# Patient Record
Sex: Female | Born: 1980 | Race: White | Hispanic: No | Smoking: Former smoker
Health system: Southern US, Community
[De-identification: ages and names within clinical notes are randomized; demographics above are authoritative.]

## PROBLEM LIST (undated history)

## (undated) DIAGNOSIS — N83209 Unspecified ovarian cyst, unspecified side: Secondary | ICD-10-CM

## (undated) DIAGNOSIS — N809 Endometriosis, unspecified: Secondary | ICD-10-CM

## (undated) DIAGNOSIS — D649 Anemia, unspecified: Secondary | ICD-10-CM

## (undated) DIAGNOSIS — R7303 Prediabetes: Secondary | ICD-10-CM

## (undated) DIAGNOSIS — F32A Depression, unspecified: Secondary | ICD-10-CM

## (undated) DIAGNOSIS — E785 Hyperlipidemia, unspecified: Secondary | ICD-10-CM

## (undated) DIAGNOSIS — G8929 Other chronic pain: Secondary | ICD-10-CM

## (undated) DIAGNOSIS — F419 Anxiety disorder, unspecified: Secondary | ICD-10-CM

## (undated) DIAGNOSIS — N946 Dysmenorrhea, unspecified: Secondary | ICD-10-CM

## (undated) HISTORY — DX: Dysmenorrhea, unspecified: N94.6

## (undated) HISTORY — DX: Endometriosis, unspecified: N80.9

## (undated) HISTORY — DX: Prediabetes: R73.03

## (undated) HISTORY — PX: NASAL SEPTUM SURGERY: SHX37

## (undated) HISTORY — DX: Anemia, unspecified: D64.9

## (undated) HISTORY — DX: Other chronic pain: G89.29

## (undated) HISTORY — DX: Depression, unspecified: F32.A

## (undated) HISTORY — DX: Hyperlipidemia, unspecified: E78.5

## (undated) HISTORY — DX: Unspecified ovarian cyst, unspecified side: N83.209

## (undated) HISTORY — DX: Anxiety disorder, unspecified: F41.9

## (undated) HISTORY — PX: LEEP: SHX91

## (undated) HISTORY — PX: LUMBAR DISC SURGERY: SHX700

---

## 2006-05-07 ENCOUNTER — Emergency Department (HOSPITAL_COMMUNITY): Admission: EM | Admit: 2006-05-07 | Discharge: 2006-05-07 | Payer: Self-pay | Admitting: Emergency Medicine

## 2006-05-18 ENCOUNTER — Ambulatory Visit: Payer: Self-pay | Admitting: Nurse Practitioner

## 2006-05-27 ENCOUNTER — Ambulatory Visit: Payer: Self-pay | Admitting: Nurse Practitioner

## 2006-06-03 ENCOUNTER — Ambulatory Visit: Payer: Self-pay | Admitting: Nurse Practitioner

## 2006-06-03 ENCOUNTER — Encounter (INDEPENDENT_AMBULATORY_CARE_PROVIDER_SITE_OTHER): Payer: Self-pay | Admitting: *Deleted

## 2006-06-08 ENCOUNTER — Ambulatory Visit (HOSPITAL_COMMUNITY): Admission: RE | Admit: 2006-06-08 | Discharge: 2006-06-08 | Payer: Self-pay | Admitting: Family Medicine

## 2006-07-13 ENCOUNTER — Ambulatory Visit: Payer: Self-pay | Admitting: Nurse Practitioner

## 2006-07-14 ENCOUNTER — Ambulatory Visit (HOSPITAL_COMMUNITY): Admission: RE | Admit: 2006-07-14 | Discharge: 2006-07-14 | Payer: Self-pay | Admitting: Family Medicine

## 2006-07-16 ENCOUNTER — Ambulatory Visit: Payer: Self-pay | Admitting: Obstetrics & Gynecology

## 2006-07-16 ENCOUNTER — Other Ambulatory Visit: Admission: RE | Admit: 2006-07-16 | Discharge: 2006-07-16 | Payer: Self-pay | Admitting: Family Medicine

## 2006-07-19 ENCOUNTER — Encounter (INDEPENDENT_AMBULATORY_CARE_PROVIDER_SITE_OTHER): Payer: Self-pay | Admitting: *Deleted

## 2006-08-03 ENCOUNTER — Ambulatory Visit: Payer: Self-pay | Admitting: Nurse Practitioner

## 2006-08-09 ENCOUNTER — Ambulatory Visit: Payer: Self-pay | Admitting: *Deleted

## 2006-08-12 ENCOUNTER — Ambulatory Visit: Payer: Self-pay | Admitting: Obstetrics & Gynecology

## 2006-08-30 ENCOUNTER — Ambulatory Visit: Payer: Self-pay | Admitting: Nurse Practitioner

## 2006-08-31 ENCOUNTER — Ambulatory Visit (HOSPITAL_COMMUNITY): Admission: RE | Admit: 2006-08-31 | Discharge: 2006-08-31 | Payer: Self-pay | Admitting: Nurse Practitioner

## 2006-09-30 ENCOUNTER — Ambulatory Visit: Payer: Self-pay | Admitting: Nurse Practitioner

## 2006-10-06 ENCOUNTER — Ambulatory Visit: Payer: Self-pay | Admitting: Obstetrics and Gynecology

## 2006-10-25 ENCOUNTER — Ambulatory Visit: Payer: Self-pay | Admitting: Nurse Practitioner

## 2006-12-06 ENCOUNTER — Ambulatory Visit: Payer: Self-pay | Admitting: Internal Medicine

## 2007-03-09 ENCOUNTER — Encounter (INDEPENDENT_AMBULATORY_CARE_PROVIDER_SITE_OTHER): Payer: Self-pay | Admitting: *Deleted

## 2007-03-14 ENCOUNTER — Ambulatory Visit: Payer: Self-pay | Admitting: Internal Medicine

## 2007-04-07 ENCOUNTER — Ambulatory Visit: Payer: Self-pay | Admitting: Family Medicine

## 2007-05-17 ENCOUNTER — Ambulatory Visit: Payer: Self-pay | Admitting: Internal Medicine

## 2007-08-25 ENCOUNTER — Emergency Department (HOSPITAL_COMMUNITY): Admission: EM | Admit: 2007-08-25 | Discharge: 2007-08-25 | Payer: Self-pay | Admitting: Family Medicine

## 2007-09-14 ENCOUNTER — Ambulatory Visit: Payer: Self-pay | Admitting: Internal Medicine

## 2007-10-24 ENCOUNTER — Ambulatory Visit: Payer: Self-pay | Admitting: Family Medicine

## 2008-02-21 ENCOUNTER — Other Ambulatory Visit: Admission: RE | Admit: 2008-02-21 | Discharge: 2008-02-21 | Payer: Self-pay | Admitting: Obstetrics and Gynecology

## 2008-05-31 ENCOUNTER — Other Ambulatory Visit: Admission: RE | Admit: 2008-05-31 | Discharge: 2008-05-31 | Payer: Self-pay | Admitting: Obstetrics and Gynecology

## 2008-08-28 ENCOUNTER — Encounter (INDEPENDENT_AMBULATORY_CARE_PROVIDER_SITE_OTHER): Payer: Self-pay | Admitting: Obstetrics and Gynecology

## 2008-08-28 ENCOUNTER — Inpatient Hospital Stay (HOSPITAL_COMMUNITY): Admission: RE | Admit: 2008-08-28 | Discharge: 2008-08-30 | Payer: Self-pay | Admitting: Pediatrics

## 2008-09-14 ENCOUNTER — Ambulatory Visit (HOSPITAL_COMMUNITY): Admission: RE | Admit: 2008-09-14 | Discharge: 2008-09-14 | Payer: Self-pay | Admitting: Obstetrics and Gynecology

## 2009-02-08 ENCOUNTER — Encounter: Admission: RE | Admit: 2009-02-08 | Discharge: 2009-02-08 | Payer: Self-pay | Admitting: Family Medicine

## 2009-04-05 ENCOUNTER — Ambulatory Visit (HOSPITAL_COMMUNITY): Admission: RE | Admit: 2009-04-05 | Discharge: 2009-04-05 | Payer: Self-pay | Admitting: Family Medicine

## 2009-04-12 ENCOUNTER — Encounter: Admission: RE | Admit: 2009-04-12 | Discharge: 2009-04-12 | Payer: Self-pay | Admitting: Neurosurgery

## 2009-07-16 ENCOUNTER — Encounter: Admission: RE | Admit: 2009-07-16 | Discharge: 2009-07-16 | Payer: Self-pay | Admitting: Neurological Surgery

## 2009-09-11 ENCOUNTER — Inpatient Hospital Stay (HOSPITAL_COMMUNITY): Admission: RE | Admit: 2009-09-11 | Discharge: 2009-09-16 | Payer: Self-pay | Admitting: Neurological Surgery

## 2009-09-26 ENCOUNTER — Encounter: Admission: RE | Admit: 2009-09-26 | Discharge: 2009-09-26 | Payer: Self-pay | Admitting: Neurological Surgery

## 2009-10-01 ENCOUNTER — Encounter: Admission: RE | Admit: 2009-10-01 | Discharge: 2009-10-01 | Payer: Self-pay | Admitting: Neurological Surgery

## 2009-10-14 ENCOUNTER — Encounter: Admission: RE | Admit: 2009-10-14 | Discharge: 2009-10-14 | Payer: Self-pay | Admitting: Neurological Surgery

## 2009-12-16 ENCOUNTER — Encounter: Admission: RE | Admit: 2009-12-16 | Discharge: 2009-12-16 | Payer: Self-pay | Admitting: Neurological Surgery

## 2010-07-08 ENCOUNTER — Encounter
Admission: RE | Admit: 2010-07-08 | Discharge: 2010-07-08 | Payer: Self-pay | Source: Home / Self Care | Attending: Neurological Surgery | Admitting: Neurological Surgery

## 2010-07-13 ENCOUNTER — Encounter: Payer: Self-pay | Admitting: Neurological Surgery

## 2010-07-13 ENCOUNTER — Encounter: Payer: Self-pay | Admitting: Family Medicine

## 2010-07-17 ENCOUNTER — Other Ambulatory Visit: Payer: Self-pay | Admitting: Neurological Surgery

## 2010-07-17 DIAGNOSIS — Z981 Arthrodesis status: Secondary | ICD-10-CM

## 2010-09-14 LAB — TYPE AND SCREEN
ABO/RH(D): O POS
Antibody Screen: NEGATIVE

## 2010-09-14 LAB — CBC
HCT: 37.4 % (ref 36.0–46.0)
MCHC: 34.2 g/dL (ref 30.0–36.0)
WBC: 8.4 10*3/uL (ref 4.0–10.5)

## 2010-09-14 LAB — DIFFERENTIAL
Basophils Absolute: 0.2 10*3/uL — ABNORMAL HIGH (ref 0.0–0.1)
Basophils Relative: 2 % — ABNORMAL HIGH (ref 0–1)
Eosinophils Absolute: 0.2 10*3/uL (ref 0.0–0.7)
Lymphs Abs: 2.1 10*3/uL (ref 0.7–4.0)
Neutro Abs: 5.4 10*3/uL (ref 1.7–7.7)
Neutrophils Relative %: 64 % (ref 43–77)

## 2010-09-14 LAB — BASIC METABOLIC PANEL
BUN: 7 mg/dL (ref 6–23)
Calcium: 9.3 mg/dL (ref 8.4–10.5)
Chloride: 107 mEq/L (ref 96–112)
Creatinine, Ser: 0.51 mg/dL (ref 0.4–1.2)
GFR calc non Af Amer: >60 mL/min (ref >60)
Glucose, Bld: 101 mg/dL — ABNORMAL HIGH (ref 70–99)
Potassium: 4.3 mEq/L (ref 3.5–5.1)

## 2010-09-14 LAB — APTT: aPTT: 40 seconds — ABNORMAL HIGH (ref 24–37)

## 2010-09-14 LAB — SURGICAL PCR SCREEN: Staphylococcus aureus: NEGATIVE

## 2010-10-02 LAB — CBC
HCT: 28.1 % — ABNORMAL LOW (ref 36.0–46.0)
Hemoglobin: 9.4 g/dL — ABNORMAL LOW (ref 12.0–15.0)
MCHC: 33.4 g/dL (ref 30.0–36.0)
MCHC: 33.4 g/dL (ref 30.0–36.0)
MCV: 90.8 fL (ref 78.0–100.0)
MCV: 91.3 fL (ref 78.0–100.0)
Platelets: 220 10*3/uL (ref 150–400)
RBC: 3.1 MIL/uL — ABNORMAL LOW (ref 3.87–5.11)
WBC: 12.2 10*3/uL — ABNORMAL HIGH (ref 4.0–10.5)
WBC: 12.3 10*3/uL — ABNORMAL HIGH (ref 4.0–10.5)

## 2010-10-02 LAB — RPR: RPR Ser Ql: NONREACTIVE

## 2010-10-02 LAB — RAPID URINE DRUG SCREEN, HOSP PERFORMED: Barbiturates: NOT DETECTED

## 2010-11-04 ENCOUNTER — Other Ambulatory Visit: Payer: Self-pay | Admitting: Neurological Surgery

## 2010-11-04 ENCOUNTER — Ambulatory Visit
Admission: RE | Admit: 2010-11-04 | Discharge: 2010-11-04 | Disposition: A | Discharge status: Home / Self Care | Payer: Self-pay | Source: Ambulatory Visit | Attending: Neurological Surgery | Admitting: Neurological Surgery

## 2010-11-04 DIAGNOSIS — M545 Low back pain: Secondary | ICD-10-CM

## 2010-11-07 NOTE — Group Therapy Note (Signed)
NAMEILLANA, Rachel Bennett NO.:  [id number]   MEDICAL RECORD NO.:  [id number]          PATIENT TYPE:  WOC   LOCATION:  [facility]                   FACILITY:  [facility]   PHYSICIAN:  Argentina Donovan, MD        DATE OF BIRTH:  [birth year]   DATE OF SERVICE:  10/06/2006                                  CLINIC NOTE   The patient is a 30 year old white female gravida 1, para 1-0-0-1 who  has had some abnormal Pap smears since the age of 74, but recently had  one more severe. 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 She came in today with her husband.  She is a smoker of a pack  and half a cigarettes per day; her husband is a nonsmoker and we  discussed the procedure in detail. In discussing, we discussed the  alternatives and also the possible complications, especially those  related to childbearing. At the end of our conversation the patient has  decided with all the options we gave her and that she is going to give  up smoking and would like to try the cryosurgery. 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           ______________________________  Argentina Donovan, MD     PR/MEDQ  D:  10/06/2006  T:  10/06/2006  Job:  [id number]

## 2011-02-03 ENCOUNTER — Other Ambulatory Visit: Payer: Self-pay | Admitting: Neurological Surgery

## 2011-02-03 DIAGNOSIS — M545 Low back pain: Secondary | ICD-10-CM

## 2011-03-13 ENCOUNTER — Ambulatory Visit
Admission: RE | Admit: 2011-03-13 | Discharge: 2011-03-13 | Disposition: A | Discharge status: Home / Self Care | Payer: 59 | Source: Ambulatory Visit | Attending: Neurological Surgery | Admitting: Neurological Surgery

## 2011-03-13 DIAGNOSIS — M545 Low back pain, unspecified: Secondary | ICD-10-CM

## 2011-04-10 IMAGING — RF DG LUMBAR SPINE 2-3V
1 series · 2 of 2 positions shown · non-contrast
Comparison: CT 07/16/2009

CLINICAL DATA: PLIF L5-S1.

LUMBAR SPINE - 2-3 VIEW

[Series 1: run · 2 of 2 slices shown]
[im 1/2]
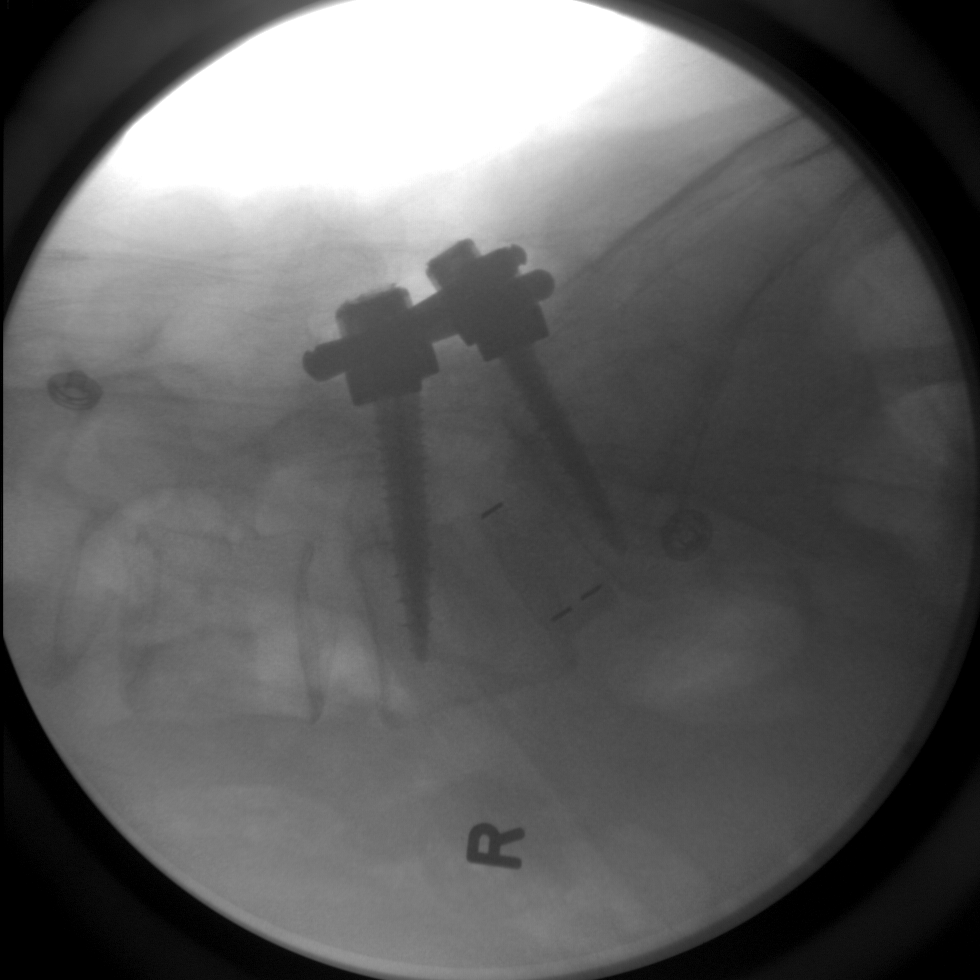
[im 2/2]
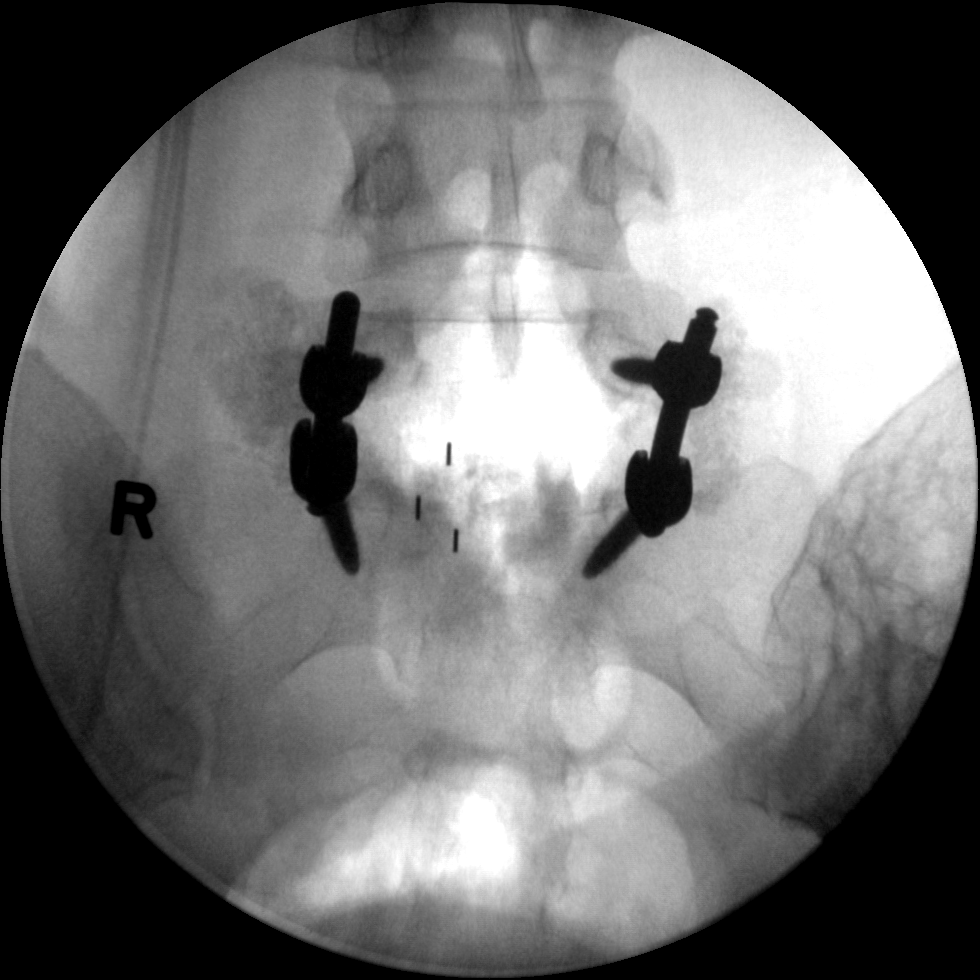

[2 of 2 positions shown; findings below may reference images not displayed]

FINDINGS: Status post PLIF procedure at L5-S1.  Transpedicle
screws are noted at L5 and S1.  Interbody fusion material with
spacers is noted as well.
IMPRESSION: Status post PLIF procedure at L5-S1.

## 2011-06-07 ENCOUNTER — Emergency Department (HOSPITAL_COMMUNITY)
Admission: EM | Admit: 2011-06-07 | Discharge: 2011-06-07 | Disposition: A | Discharge status: Home / Self Care | Payer: Self-pay | Source: Home / Self Care | Attending: Family Medicine | Admitting: Family Medicine

## 2011-06-07 ENCOUNTER — Emergency Department (INDEPENDENT_AMBULATORY_CARE_PROVIDER_SITE_OTHER): Payer: Self-pay

## 2011-06-07 DIAGNOSIS — S92253A Displaced fracture of navicular [scaphoid] of unspecified foot, initial encounter for closed fracture: Secondary | ICD-10-CM

## 2011-06-07 NOTE — ED Provider Notes (Signed)
History     CSN: [id number] Arrival date & time: 06/07/2011  4:14 PM   First MD Initiated Contact with Patient 06/07/11 1457      Chief Complaint  Patient presents with  . Foot Injury    (Consider location/radiation/quality/duration/timing/severity/associated sxs/prior treatment) Patient is a 30 y.o. female presenting with foot injury.  Foot Injury  The incident occurred more than 2 days ago. The incident occurred at home. The injury mechanism was torsion. The pain is present in the left foot. The quality of the pain is described as aching and throbbing. The pain is moderate. Associated symptoms include inability to bear weight. Associated symptoms comments: Using crutches.. She reports no foreign bodies present. The symptoms are aggravated by nothing.    History reviewed. No pertinent past medical history.  Past Surgical History  Procedure Date  . Lumbar disc surgery     History reviewed. No pertinent family history.  History  Substance Use Topics  . Smoking status: Current Everyday Smoker  . Smokeless tobacco: Not on file  . Alcohol Use: No    OB History    Grav Para Term Preterm Abortions TAB SAB Ect Mult Living                  Review of Systems  Constitutional: Negative.   Musculoskeletal: Positive for joint swelling and gait problem.    Allergies  Sulfa antibiotics  Home Medications   Current Outpatient Rx  Name Route Sig Dispense Refill  . MORPHINE SULFATE 30 MG PO TABS Oral Take 30 mg by mouth 2 (two) times daily at 10 AM and 5 PM.      . OXYCODONE-ACETAMINOPHEN 10-325 MG PO TABS Oral Take 1 tablet by mouth QID.        BP 115/79  Pulse 88  Temp(Src) 98.2 F (36.8 C) (Oral)  Resp 17  SpO2 96%  LMP 05/29/2011  Physical Exam  Constitutional: She appears well-developed and well-nourished.  Musculoskeletal: She exhibits edema and tenderness.       Feet:  Skin: Skin is warm and dry.    ED Course  Procedures (including critical care  time)  Labs Reviewed - No data to display Dg Foot Complete Left  06/07/2011  **ADDENDUM** CREATED: 06/07/2011 16:56:40  The referring physician reports the patient is having point tenderness within the lateral hindfoot region.  On the radiograph there is a linear ossific density adjacent to the calcaneous. Although this is not seen on the oblique radiograph, this may represent a small avulsion fracture.  **END ADDENDUM** SIGNED BY: Rosealee Albee, M.D.   06/07/2011  *RADIOLOGY REPORT*  Clinical Data: Status post fall.  LEFT FOOT - COMPLETE 3+ VIEW  Comparison: None  Findings:   There is no evidence of fracture or dislocation.  There is no evidence of arthropathy or other focal bone abnormality. Soft tissues are unremarkable.  IMPRESSION:  Negative exam. Original Report Authenticated By: Rosealee Albee, M.D.     1. Avulsion fracture of navicular bone of foot       MDM  X-rays reviewed and report per radiologist.         Barkley Bruns, MD 06/07/11 2015

## 2011-06-07 NOTE — ED Notes (Signed)
Pt stood up on Wed and states she turned her lt foot over and "felt a crunch".  Swollen and bruised, pt walking on crutches.

## 2013-01-03 IMAGING — CR DG FOOT COMPLETE 3+V*L*
3 series · 3 of 3 positions shown · non-contrast
Comparison: None

***ADDENDUM*** CREATED: 06/07/2011 [DATE]

The referring physician reports the patient is having point
tenderness within the lateral hindfoot region.  On the radiograph
there is a linear ossific density adjacent to the calcaneous.
Although this is not seen on the oblique radiograph, this may
represent a small avulsion fracture.
CLINICAL DATA: Status post fall.
LEFT FOOT - COMPLETE 3+ VIEW

[view not recorded (1 of 3)]
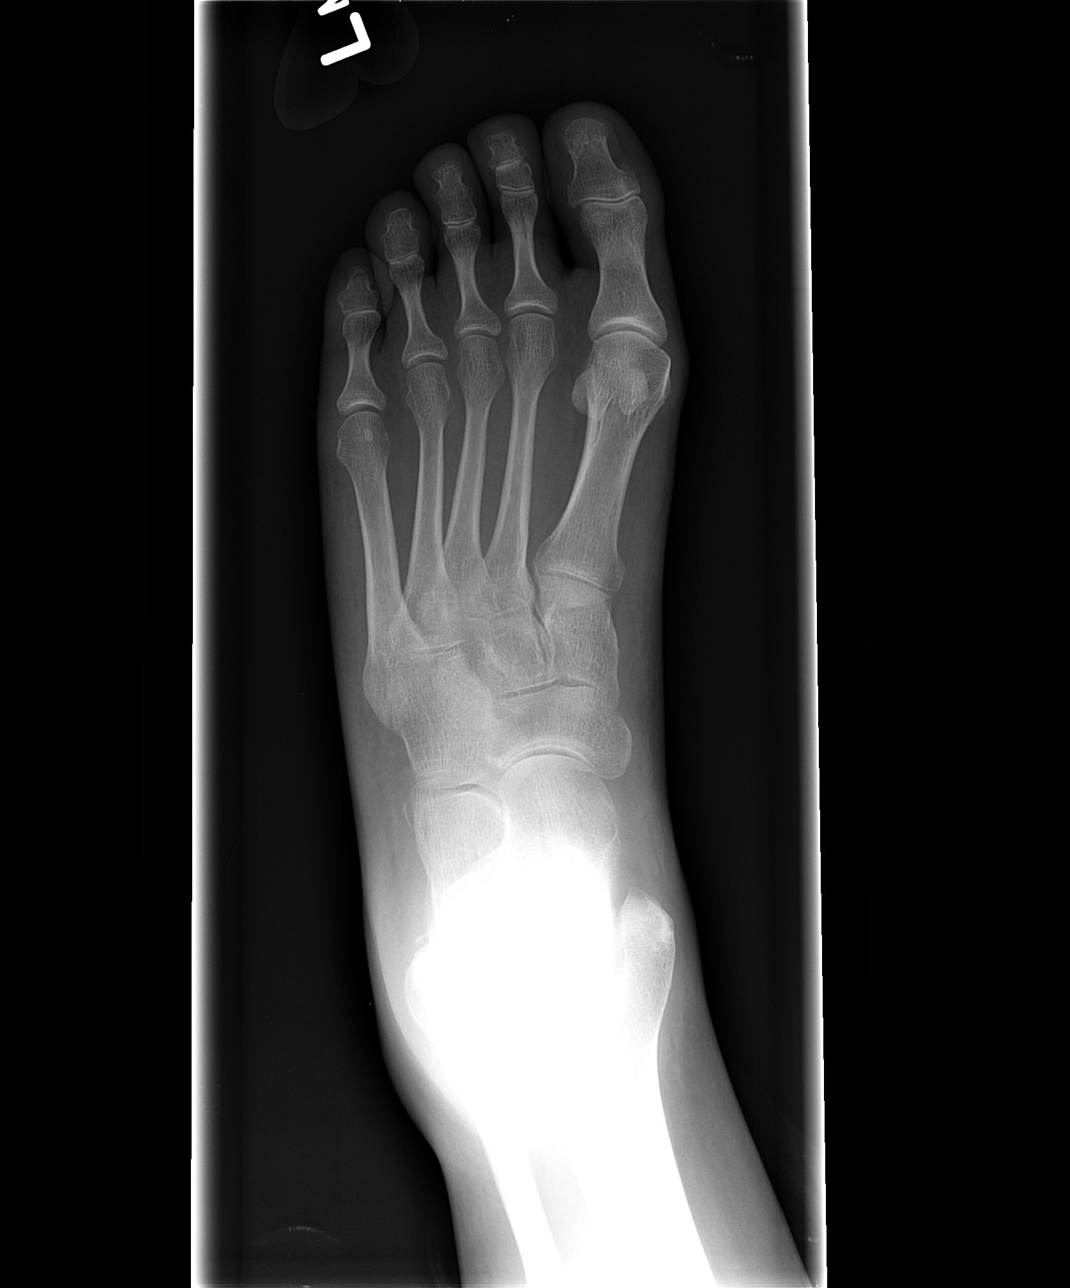

[view not recorded (2 of 3)]
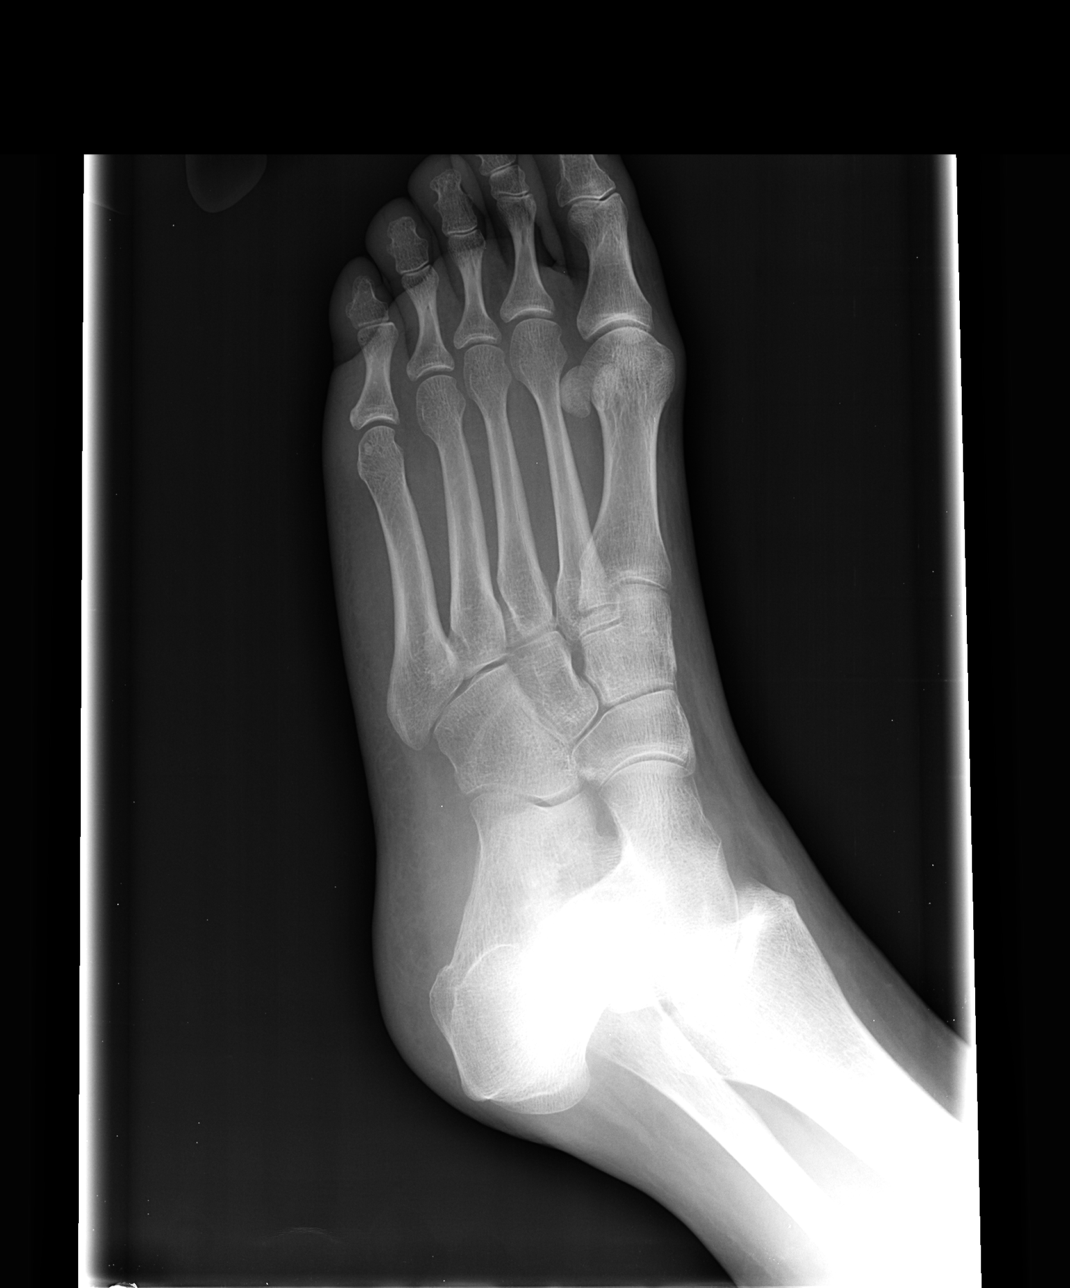

[view not recorded (3 of 3)]
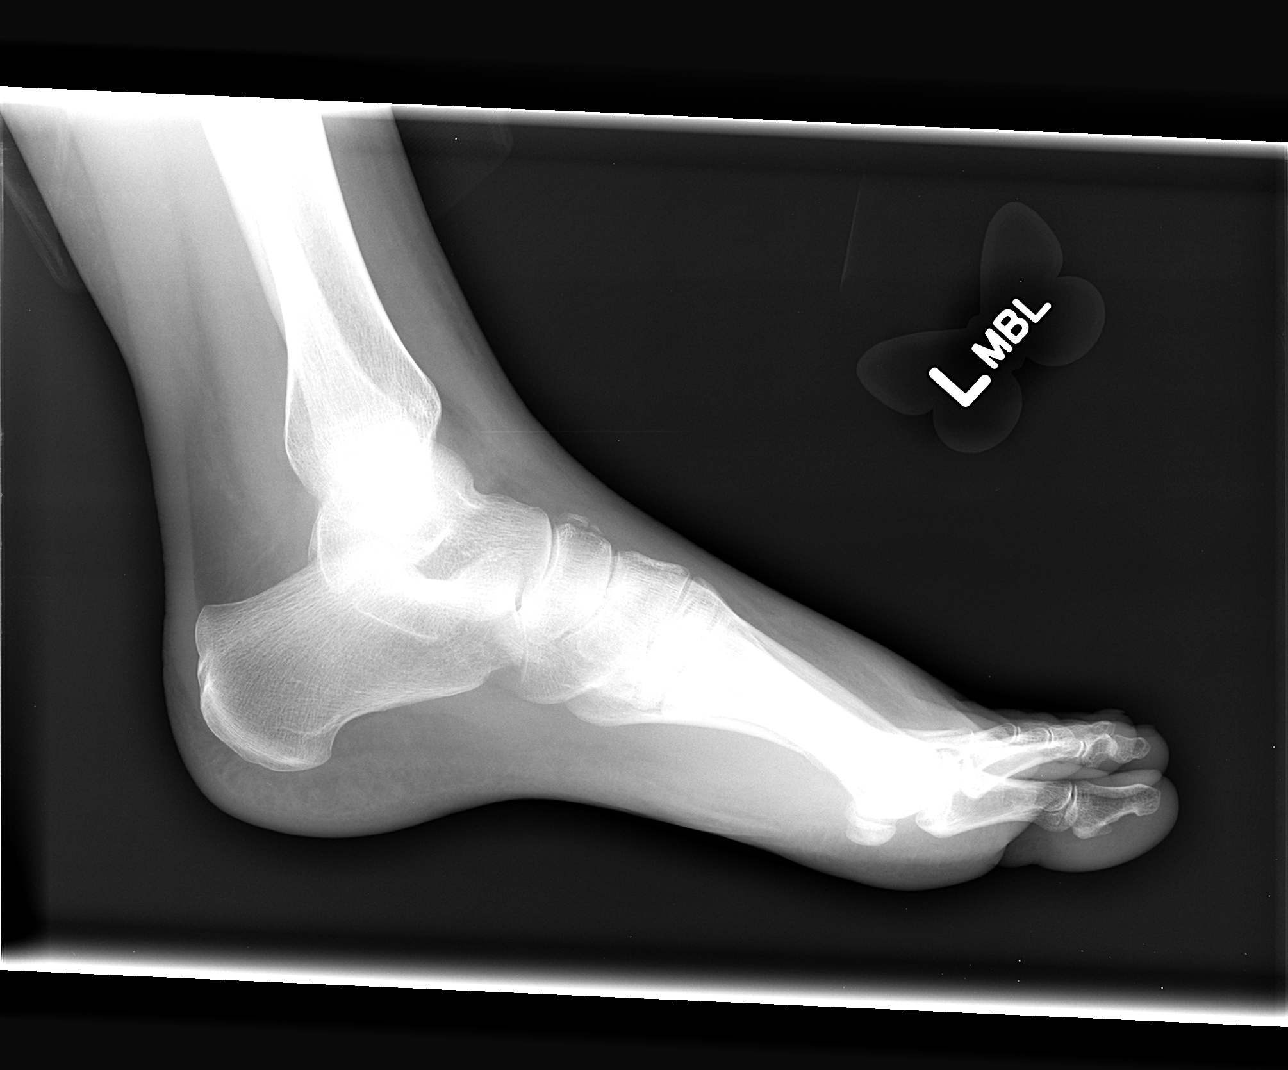

[3 of 3 positions shown; findings below may reference images not displayed]

FINDINGS: There is no evidence of fracture or dislocation.  There
is no evidence of arthropathy or other focal bone abnormality.
Soft tissues are unremarkable.
IMPRESSION: Negative exam.

## 2017-03-04 ENCOUNTER — Ambulatory Visit (INDEPENDENT_AMBULATORY_CARE_PROVIDER_SITE_OTHER): Payer: 59 | Admitting: Adult Health

## 2017-03-04 ENCOUNTER — Encounter: Payer: Self-pay | Admitting: Adult Health

## 2017-03-04 VITALS — BP 107/73 | HR 102 | Ht 65.25 in | Wt 109.8 lb

## 2017-03-04 DIAGNOSIS — G8929 Other chronic pain: Secondary | ICD-10-CM

## 2017-03-04 DIAGNOSIS — F1193 Opioid use, unspecified with withdrawal: Secondary | ICD-10-CM | POA: Insufficient documentation

## 2017-03-04 DIAGNOSIS — R1310 Dysphagia, unspecified: Secondary | ICD-10-CM | POA: Diagnosis not present

## 2017-03-04 DIAGNOSIS — R000 Tachycardia, unspecified: Secondary | ICD-10-CM

## 2017-03-04 DIAGNOSIS — M545 Low back pain: Secondary | ICD-10-CM | POA: Diagnosis not present

## 2017-03-04 DIAGNOSIS — F1123 Opioid dependence with withdrawal: Secondary | ICD-10-CM | POA: Diagnosis not present

## 2017-03-04 HISTORY — DX: Opioid dependence with withdrawal: F11.23

## 2017-03-04 HISTORY — DX: Opioid use, unspecified with withdrawal: F11.93

## 2017-03-04 MED ORDER — LORAZEPAM 0.5 MG PO TABS: 0.5000 mg | ORAL_TABLET | Freq: Three times a day (TID) | ORAL | 0 refills | Status: DC | PRN

## 2017-03-04 MED ORDER — ONDANSETRON 8 MG PO TBDP: 8.0000 mg | ORAL_TABLET | Freq: Three times a day (TID) | ORAL | 0 refills | Status: DC | PRN

## 2017-03-04 NOTE — Assessment & Plan Note (Addendum)
Several calls placed to [facility]-no response.  Controlled Substance Website reviewed- 30 day rx for oxycodone and morphine being filled every 30 days. EKG- ST with possible L atrial enlargement. She vehemently refused to go to UC/ED or seek help at treatment center and does not plan on remaining off narcotics. Unable to corroborate story with pain clinic, therefore will not provide narcotic rx. Pt given instructions on withdrawal and Lorazepam and Ondansetron rx's to help ease withdrawal sx's. Encouraged to push fluids and eat small, well balanced meals. Discussed Red Flag sx's and to seek immediate medical attention if any develop. Discussed at length the benefits of remaining off narcotics and trying alternative pain control therapy-PT, chiropractor, injection therapy, smoking cessation. Since she is so unhappy with current pain clinic, referral for new pain clinic placed. Recommended to schedule CPE with fasting labs in 4 weeks.

## 2017-03-04 NOTE — Progress Notes (Signed)
Subjective:    Patient ID: Rachel Bennett, female    DOB: 04/04/1981, 36 y.o.   MRN: [id number]  HPI:  Rachel Bennett presents to establish as a new pt.  She is a 36 year old female.  PMH: Chronic pain with long term narcotic therapy.  Chronic pain in lumbar back, bil hips, and pelvis.  She denies acute injury/trauma prior to pain developing and states "I was born with this pain".  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 She underwent PLIF L5-S1 3/23/1[place]/nuerosurgeon. She was eventually referred to [facility] and has been under their care for since 2012.  She was previously on Oxy/Acetaminphen 10/325 1 tab Q4H and Morphine ER 15mg  Q12H.  Then she was taken of both and started on Oxy 15mg  Q4-6H on 01/29/17.  She reports that the switch was "b/c the government says you can't be on 2 narcotics at the same time". She reports that the new med was causing burning tongue sensation and dysphagia when swallowing the tablet.  She denies this ever occurring before and denies hx of GERD.  She reports that when she went for her regular monthly f/u at the [facility] clinic the provider refused to refill her meds due to new of dysphagia and required her to be evaluated by primary care. She has not had regular medical or dental care in >10 years. She has not had any narcotics since 02/26/17 and has been experiencing N/V/D/fever/sweats/chills/body aches/extremet fatigue/poor appetite/insomnia/chest tightness and severe pain in back/hips/pelvis. She smokes 1-1/2 pack and day and has been for >18 years. She denies any ETOH use. She reports 10lb weight loss since 02/26/17.  She has f/u with the [place] clinic 03/09/17. She is married with two children ages 32,13.  Her husband is currently out of town, however her brother in law is local and has been assisting her as needed. She denies thoughts of harming herself/others. She reports feeling "absoluetly the worst I have ever felt in my entire  life".   Patient Care Team    Relationship Specialty Notifications Start End  Esaw Grandchild, NP PCP - General Family Medicine  03/04/17     Patient Active Problem List   Diagnosis Date Noted  . Opiate withdrawal ([place]) 03/04/2017     Past Medical History:  Diagnosis Date  . Chronic pain    back, hips and pelvis     Past Surgical History:  Procedure Laterality Date  . LUMBAR DISC SURGERY    . NASAL SEPTUM SURGERY       Family History  Problem Relation Age of Onset  . Arthritis Mother   . Anxiety disorder Mother   . Depression Mother   . Diabetes Mother   . Heart disease Mother   . Hyperlipidemia Mother   . Hypertension Mother   . Thyroid disease Mother   . Birth defects Sister   . Migraines Sister   . Healthy Brother   . Healthy Daughter   . Healthy Sister   . Drug abuse Brother   . Healthy Brother   . Healthy Daughter      History  Drug Use No     History  Alcohol Use No     History  Smoking Status  . Current Every Day Smoker  . Packs/day: 1.00  . Years: 18.00  . Types: Cigarettes  Smokeless Tobacco  . Never Used     Outpatient Encounter Prescriptions as of 03/04/2017  Medication Sig  .  cyclobenzaprine (FLEXERIL) 10 MG tablet Take 1 tablet by mouth 2 (two) times daily.  Marland Kitchen LORazepam (ATIVAN) 0.5 MG tablet Take 1 tablet (0.5 mg total) by mouth every 8 (eight) hours as needed for anxiety or sleep.  Marland Kitchen ondansetron (ZOFRAN-ODT) 8 MG disintegrating tablet Take 1 tablet (8 mg total) by mouth every 8 (eight) hours as needed for nausea or vomiting.  Marland Kitchen oxyCODONE (ROXICODONE) 15 MG immediate release tablet Take 1 tablet by mouth every 5 (five) hours as needed.  Marland Kitchen oxyCODONE-acetaminophen (PERCOCET) 10-325 MG per tablet Take 1 tablet by mouth QID.    . [DISCONTINUED] morphine (MSIR) 30 MG tablet Take 30 mg by mouth 2 (two) times daily at 10 AM and 5 PM.     No facility-administered encounter medications on file as of 03/04/2017.     Allergies: Sulfa  antibiotics  Body mass index is 18.13 kg/m.  Blood pressure 107/73, pulse (!) 102, height 5' 5.25" (1.657 m), weight 109 lb 12.8 oz (49.8 kg), last menstrual period 02/12/2017.    Patient Care Team    Relationship Specialty Notifications Start End  Esaw Grandchild, NP PCP - General Family Medicine  03/04/17     Patient Active Problem List   Diagnosis Date Noted  . Opiate withdrawal ([place]) 03/04/2017     Past Medical History:  Diagnosis Date  . Chronic pain    back, hips and pelvis     Past Surgical History:  Procedure Laterality Date  . LUMBAR DISC SURGERY    . NASAL SEPTUM SURGERY       Family History  Problem Relation Age of Onset  . Arthritis Mother   . Anxiety disorder Mother   . Depression Mother   . Diabetes Mother   . Heart disease Mother   . Hyperlipidemia Mother   . Hypertension Mother   . Thyroid disease Mother   . Birth defects Sister   . Migraines Sister   . Healthy Brother   . Healthy Daughter   . Healthy Sister   . Drug abuse Brother   . Healthy Brother   . Healthy Daughter      History  Drug Use No     History  Alcohol Use No     History  Smoking Status  . Current Every Day Smoker  . Packs/day: 1.00  . Years: 18.00  . Types: Cigarettes  Smokeless Tobacco  . Never Used     Outpatient Encounter Prescriptions as of 03/04/2017  Medication Sig  . cyclobenzaprine (FLEXERIL) 10 MG tablet Take 1 tablet by mouth 2 (two) times daily.  Marland Kitchen LORazepam (ATIVAN) 0.5 MG tablet Take 1 tablet (0.5 mg total) by mouth every 8 (eight) hours as needed for anxiety or sleep.  Marland Kitchen ondansetron (ZOFRAN-ODT) 8 MG disintegrating tablet Take 1 tablet (8 mg total) by mouth every 8 (eight) hours as needed for nausea or vomiting.  Marland Kitchen oxyCODONE (ROXICODONE) 15 MG immediate release tablet Take 1 tablet by mouth every 5 (five) hours as needed.  Marland Kitchen oxyCODONE-acetaminophen (PERCOCET) 10-325 MG per tablet Take 1 tablet by mouth QID.    . [DISCONTINUED] morphine  (MSIR) 30 MG tablet Take 30 mg by mouth 2 (two) times daily at 10 AM and 5 PM.     No facility-administered encounter medications on file as of 03/04/2017.     Allergies: Sulfa antibiotics  Body mass index is 18.13 kg/m.  Blood pressure 107/73, pulse (!) 102, height 5' 5.25" (1.657 m), weight 109 lb 12.8 oz (49.8 kg),  last menstrual period 02/12/2017.    Review of Systems  Constitutional: Positive for activity change, appetite change, chills, diaphoresis, fatigue, fever and unexpected weight change.  HENT: Negative for congestion.   Eyes: Negative for visual disturbance.  Respiratory: Positive for chest tightness. Negative for cough, shortness of breath, wheezing and stridor.   Cardiovascular: Positive for chest pain. Negative for palpitations and leg swelling.  Gastrointestinal: Positive for abdominal distention, abdominal pain, diarrhea, nausea and vomiting. Negative for blood in stool and constipation.  Endocrine: Positive for cold intolerance and heat intolerance. Negative for polydipsia, polyphagia and polyuria.  Genitourinary: Negative for difficulty urinating, flank pain and hematuria.  Musculoskeletal: Positive for arthralgias, back pain, gait problem and myalgias. Negative for joint swelling, neck pain and neck stiffness.  Skin: Negative for color change, pallor, rash and wound.  Neurological: Positive for dizziness and headaches.  Hematological: Does not bruise/bleed easily.  Psychiatric/Behavioral: Positive for agitation and sleep disturbance. Negative for behavioral problems, confusion, decreased concentration, dysphoric mood, hallucinations, self-injury and suicidal ideas. The patient is nervous/anxious. The patient is not hyperactive.        Objective:   Physical Exam  Constitutional: She is oriented to person, place, and time. She appears well-developed. She appears distressed.  HENT:  Head: Normocephalic and atraumatic.  Right Ear: No decreased hearing is noted.   Left Ear: No decreased hearing is noted.  Nose: Nose normal. Right sinus exhibits no maxillary sinus tenderness and no frontal sinus tenderness. Left sinus exhibits no maxillary sinus tenderness and no frontal sinus tenderness.  Mouth/Throat: Uvula is midline. Mucous membranes are pale and dry. Oral lesions present. Abnormal dentition. Dental caries present. No uvula swelling. Posterior oropharyngeal erythema present. No oropharyngeal exudate, posterior oropharyngeal edema or tonsillar abscesses.  Eyes: Pupils are equal, round, and reactive to light. Conjunctivae are normal.  Neck: Normal range of motion. Neck supple.  Cardiovascular: Regular rhythm, S1 normal, S2 normal, normal heart sounds and intact distal pulses.  Tachycardia present.  Exam reveals no gallop.   No murmur heard. HR 102-142 EKG completed, results discussed at [facility]  Pulmonary/Chest: Effort normal and breath sounds normal. No respiratory distress. She has no wheezes. She has no rales. She exhibits no tenderness.  Abdominal: Soft. Bowel sounds are normal. She exhibits no distension and no mass. There is generalized tenderness. There is no rigidity, no rebound, no guarding, no CVA tenderness, no tenderness at McBurney's point and negative Murphy's sign.  Musculoskeletal: She exhibits tenderness.  Lymphadenopathy:    She has no cervical adenopathy.  Neurological: She is alert and oriented to person, place, and time.  Skin: Skin is warm and dry. No rash noted. She is not diaphoretic. No erythema. No pallor.  Psychiatric: Her speech is normal and behavior is normal. Judgment and thought content normal. Her mood appears anxious. Cognition and memory are normal.  Nursing note and vitals reviewed.         Assessment & Plan:   1. Chronic bilateral low back pain, with sciatica presence unspecified   2. Opiate withdrawal ([facility])   3. Dysphagia, idiopathic     Opiate withdrawal ([facility]) Several calls placed to [facility]-no  response. [place] Controlled Substance Website reviewed- 30 day rx for oxycodone and morphine being filled every 30 days. EKG- ST with possible L atrial enlargement. She vehemently refused to go to UC/ED or seek help at treatment center and does not plan on remaining off narcotics. Unable to corroborate story with pain clinic, therefore will not provide narcotic rx.  Pt given instructions on withdrawal and Lorazepam and Ondansetron rx's to help ease withdrawal sx's. Encouraged to push fluids and eat small, well balanced meals. Discussed Red Flag sx's and to seek immediate medical attention if any develop. Discussed at length the benefits of remaining off narcotics and trying alternative pain control therapy-PT, chiropractor, injection therapy, smoking cessation. Since she is so unhappy with current pain clinic, referral for new pain clinic placed. Recommended to schedule CPE with fasting labs in 4 weeks.   Chronic bilateral low back pain Discussed non-narcotic pain relief measures-PT, chiropractor, injection therapy, smoking cessation.    Dysphagia, idiopathic Reports dysphagia when trying to swallow the newly rx'd oxycodone 15mg  IR tablets. She was able to swallow >16 ounces of water without any difficulty in clinic today. No obvious structural abnormalities of throat noted.   If this occurs again, then referral to ENT.  Well over an hour spent with the pt discussing long term narcotic use and withdrawal.  FOLLOW-UP:  Return in 4 weeks (on 04/01/2017) for CPE, Fasting Lab Draw.

## 2017-03-04 NOTE — Patient Instructions (Addendum)
Heart-Healthy Eating Plan Many factors influence your heart health, including eating and exercise habits. Heart (coronary) risk increases with abnormal blood fat (lipid) levels. Heart-healthy meal planning includes limiting unhealthy fats, increasing healthy fats, and making other small dietary changes. This includes maintaining a healthy body weight to help keep lipid levels within a normal range. What is my plan? Your health care provider recommends that you:  Get no more than __25__% of the total calories in your daily diet from fat.  Limit your intake of saturated fat to less than ___5___% of your total calories each day.  Limit the amount of cholesterol in your diet to less than _300__ mg per day.  What types of fat should I choose?  Choose healthy fats more often. Choose monounsaturated and polyunsaturated fats, such as olive oil and canola oil, flaxseeds, walnuts, almonds, and seeds.  Eat more omega-3 fats. Good choices include salmon, mackerel, sardines, tuna, flaxseed oil, and ground flaxseeds. Aim to eat fish at least two times each week.  Limit saturated fats. Saturated fats are primarily found in animal products, such as meats, butter, and cream. Plant sources of saturated fats include palm oil, palm kernel oil, and coconut oil.  Avoid foods with partially hydrogenated oils in them. These contain trans fats. Examples of foods that contain trans fats are stick margarine, some tub margarines, cookies, crackers, and other baked goods. What general guidelines do I need to follow?  Check food labels carefully to identify foods with trans fats or high amounts of saturated fat.  Fill one half of your plate with vegetables and green salads. Eat 4-5 servings of vegetables per day. A serving of vegetables equals 1 cup of raw leafy vegetables,  cup of raw or cooked cut-up vegetables, or  cup of vegetable juice.  Fill one fourth of your plate with whole grains. Look for the word "whole"  as the first word in the ingredient list.  Fill one fourth of your plate with lean protein foods.  Eat 4-5 servings of fruit per day. A serving of fruit equals one medium whole fruit,  cup of dried fruit,  cup of fresh, frozen, or canned fruit, or  cup of 100% fruit juice.  Eat more foods that contain soluble fiber. Examples of foods that contain this type of fiber are apples, broccoli, carrots, beans, peas, and barley. Aim to get 20-30 g of fiber per day.  Eat more home-cooked food and less restaurant, buffet, and fast food.  Limit or avoid alcohol.  Limit foods that are high in starch and sugar.  Avoid fried foods.  Cook foods by using methods other than frying. Baking, boiling, grilling, and broiling are all great options. Other fat-reducing suggestions include: ? Removing the skin from poultry. ? Removing all visible fats from meats. ? Skimming the fat off of stews, soups, and gravies before serving them. ? Steaming vegetables in water or broth.  Lose weight if you are overweight. Losing just 5-10% of your initial body weight can help your overall health and prevent diseases such as diabetes and heart disease.  Increase your consumption of nuts, legumes, and seeds to 4-5 servings per week. One serving of dried beans or legumes equals  cup after being cooked, one serving of nuts equals 1 ounces, and one serving of seeds equals  ounce or 1 tablespoon.  You may need to monitor your salt (sodium) intake, especially if you have high blood pressure. Talk with your health care provider or dietitian to get  more information about reducing sodium. What foods can I eat? Grains  Breads, including [place], white, pita, wheat, raisin, rye, oatmeal, and [place]. Tortillas that are neither fried nor made with lard or trans fat. Low-fat rolls, including hotdog and hamburger buns and English muffins. Biscuits. Muffins. Waffles. Pancakes. Light popcorn. Whole-grain cereals. Flatbread. Melba  toast. Pretzels. Breadsticks. Rusks. Low-fat snacks and crackers, including oyster, saltine, matzo, graham, animal, and rye. Rice and pasta, including brown rice and those that are made with whole wheat. Vegetables All vegetables. Fruits All fruits, but limit coconut. Meats and Other Protein Sources Lean, well-trimmed beef, veal, pork, and lamb. Chicken and [place] without skin. All fish and shellfish. Wild duck, rabbit, pheasant, and venison. Egg whites or low-cholesterol egg substitutes. Dried beans, peas, lentils, and tofu.Seeds and most nuts. Dairy Low-fat or nonfat cheeses, including ricotta, string, and mozzarella. Skim or 1% milk that is liquid, powdered, or evaporated. Buttermilk that is made with low-fat milk. Nonfat or low-fat yogurt. Beverages Mineral water. Diet carbonated beverages. Sweets and Desserts Sherbets and fruit ices. Honey, jam, marmalade, jelly, and syrups. Meringues and gelatins. Pure sugar candy, such as hard candy, jelly beans, gumdrops, mints, marshmallows, and small amounts of dark chocolate. [employer] Eat all sweets and desserts in moderation. Fats and Oils Nonhydrogenated (trans-free) margarines. Vegetable oils, including soybean, sesame, sunflower, olive, peanut, safflower, corn, canola, and cottonseed. Salad dressings or mayonnaise that are made with a vegetable oil. Limit added fats and oils that you use for cooking, baking, salads, and as spreads. Other Cocoa powder. Coffee and tea. All seasonings and condiments. The items listed above may not be a complete list of recommended foods or beverages. Contact your dietitian for more options. What foods are not recommended? Grains Breads that are made with saturated or trans fats, oils, or whole milk. Croissants. Butter rolls. Cheese breads. Sweet rolls. Donuts. Buttered popcorn. Chow mein noodles. High-fat crackers, such as cheese or butter crackers. Meats and Other Protein Sources Fatty meats, such as  hotdogs, short ribs, sausage, spareribs, bacon, ribeye roast or steak, and mutton. High-fat deli meats, such as salami and bologna. Caviar. Domestic duck and goose. Organ meats, such as kidney, liver, sweetbreads, brains, gizzard, chitterlings, and heart. Dairy Cream, sour cream, cream cheese, and creamed cottage cheese. Whole milk cheeses, including blue (bleu), Monte[place] Jack, [place], [place], American, [place], Swiss, [place], [place], and [place]. Whole or 2% milk that is liquid, evaporated, or condensed. Whole buttermilk. Cream sauce or high-fat cheese sauce. Yogurt that is made from whole milk. Beverages Regular sodas and drinks with added sugar. Sweets and Desserts Frosting. Pudding. Cookies. Cakes other than angel food cake. Candy that has milk chocolate or white chocolate, hydrogenated fat, butter, coconut, or unknown ingredients. Buttered syrups. Full-fat ice cream or ice cream drinks. Fats and Oils Gravy that has suet, meat fat, or shortening. Cocoa butter, hydrogenated oils, palm oil, coconut oil, palm kernel oil. These can often be found in baked products, candy, fried foods, nondairy creamers, and whipped toppings. Solid fats and shortenings, including bacon fat, salt pork, lard, and butter. Nondairy cream substitutes, such as coffee creamers and sour cream substitutes. Salad dressings that are made of unknown oils, cheese, or sour cream. The items listed above may not be a complete list of foods and beverages to avoid. Contact your dietitian for more information. This information is not intended to replace advice given to you by your health care provider. Make sure you discuss any questions you have with your health care  provider. Document Released: 03/17/2008 Document Revised: 12/27/2015 Document Reviewed: 11/30/2013 Elsevier Interactive Patient Education  2017 [place].   Opioid Withdrawal Opioids are powerful substances that relieve pain. Opioids include illegal drugs, such as  heroin, as well as prescription pain medicines, such as codeine, morphine, hydrocodone, oxycodone, and fentanyl. Opioid withdrawal is a group of symptoms that can happen if you have been taking opioids for a long time and suddenly stop. What are the causes? This condition is caused by taking opioids for weeks and then doing any of the following:  Stopping use.  Rapidly reducing use.  Taking a medicine to block their effect.  What increases the risk? This condition is more likely to develop in:  People who take opioids incorrectly.  People who take opioids for a long period of time.  What are the signs or symptoms? Symptoms of this condition can be physical or mental. Physical symptoms include:  Nausea and vomiting.  Muscle aches or spasms.  Watery eyes and runny nose.  Widening of the dark centers of the eyes (dilated pupils).  Hair standing on end.  Fever and sweating.  Intestinal cramping and diarrhea.  Increased blood pressure and fast pulse.  Mental symptoms include:  Depression.  Anxiety.  Restlessness and irritability.  Trouble sleeping.  When symptoms start and how long they last depends on if you have been taking an opioid that works fast and then loses its effect quickly (short acting-opioid), an opioid that works for a longer period of time (long-acting opioid), or a drug that blocks the effects of opioids.  If you have been taking a short-acting opioid, such as heroin and oxycodone, symptoms occur within hours of stopping or reducing the amount you take. The worst symptoms (peak withdrawal) occur in 24-48 hours. Symptoms should subside in 3-5 days.  If you have been taking a long-acting opioid, such as methadone, symptoms can occur within 30 hours of stopping or reducing the amount you take and can continue for up to 10 days.  If you are taking a drug that blocks the effects of opioids, such as naltrexone or naloxone, symptoms begin within  minutes.  How is this diagnosed? This condition is diagnosed based on:  Your symptoms.  Your medical history.  Your history of drug and alcohol use.  Which medicines you have been taking.  Your health care provider may:  Perform a physical exam.  Order tests.  Ask that you see a mental health professional.  How is this treated? Treatment for this condition is usually provided by mental health professionals with training in substance use disorders (addiction specialists). Treatment may involve:  Counseling. This treatment is also called talk therapy. It is provided by substance use treatment counselors.  Support groups. Support groups are run by people who have quit using opioids. They provide emotional support, advice, and guidance.  Medicine. Some medicines can help to lessen certain withdrawal symptoms. Sometimes an opioid is prescribed to replace the opioid that you have been taking. You may be asked to take less and less of this opioid over time to lessen or prevent withdrawal symptoms.  Follow these instructions at home:  Take over-the-counter and prescription medicines only as told by your health care provider.  Check with your health care provider before starting any new medicines.  Keep all follow-up visits as told by your health care provider. This is important. Contact a health care provider if:  You are not able to take your medicines as told.  Your  symptoms get worse.  You take an opioid after stopping use, or you take more of an opioid than you have been. Get help right away if:  You have a seizure.  You lose consciousness.  You have serious thoughts about hurting yourself or others. If you ever feel like you may hurt yourself or others, or have thoughts about taking your own life, get help right away. You can go to your nearest emergency department or call:  Your local emergency services (911 in the [place]  A suicide crisis helpline, such as the  [facility] at 408-638-8076. This is open 24 hours a day.  This information is not intended to replace advice given to you by your health care provider. Make sure you discuss any questions you have with your health care provider. Document Released: 06/11/2003 Document Revised: 03/20/2016 Document Reviewed: 03/20/2016 Elsevier Interactive Patient Education  2018 [employer].   Please take Lorazepam and Ondansetron as directed. Increase fluids, rest, eat a well balanced diet. Seek IMMEDIATE MEDICAL CARE is chest pain, severe shortness of breath, or seizures noted.  Referral for new pain clinic placed, however strongly recommend keeping your existing appt with [facility]Marland Kitchen Please schedule full physical with fasting labs in the next 4 weeks.

## 2017-03-05 DIAGNOSIS — G8929 Other chronic pain: Secondary | ICD-10-CM

## 2017-03-05 DIAGNOSIS — M545 Low back pain, unspecified: Secondary | ICD-10-CM

## 2017-03-05 DIAGNOSIS — R1310 Dysphagia, unspecified: Secondary | ICD-10-CM | POA: Insufficient documentation

## 2017-03-05 HISTORY — DX: Other chronic pain: G89.29

## 2017-03-05 HISTORY — DX: Low back pain, unspecified: M54.50

## 2017-03-05 HISTORY — DX: Dysphagia, unspecified: R13.10

## 2017-03-05 NOTE — Assessment & Plan Note (Addendum)
Discussed non-narcotic pain relief measures-PT, chiropractor, injection therapy, smoking cessation.

## 2017-03-05 NOTE — Assessment & Plan Note (Signed)
Reports dysphagia when trying to swallow the newly rx'd oxycodone 15mg  IR tablets. She was able to swallow >16 ounces of water without any difficulty in clinic today. No obvious structural abnormalities of throat noted.   If this occurs again, then referral to ENT.

## 2017-03-08 NOTE — Addendum Note (Signed)
Addended by: Fonnie Mu on: 03/08/2017 09:49 AM   Modules accepted: Orders

## 2017-04-01 ENCOUNTER — Ambulatory Visit: Payer: 59 | Admitting: Adult Health

## 2017-05-05 ENCOUNTER — Other Ambulatory Visit: Payer: 59

## 2017-05-05 DIAGNOSIS — Z131 Encounter for screening for diabetes mellitus: Secondary | ICD-10-CM

## 2017-05-05 DIAGNOSIS — Z0000 Encounter for general adult medical examination without abnormal findings: Secondary | ICD-10-CM

## 2017-05-05 DIAGNOSIS — F1123 Opioid dependence with withdrawal: Secondary | ICD-10-CM

## 2017-05-05 DIAGNOSIS — F1193 Opioid use, unspecified with withdrawal: Secondary | ICD-10-CM

## 2017-05-05 DIAGNOSIS — Z13220 Encounter for screening for lipoid disorders: Secondary | ICD-10-CM

## 2017-05-05 DIAGNOSIS — R1310 Dysphagia, unspecified: Secondary | ICD-10-CM

## 2017-05-06 LAB — COMPREHENSIVE METABOLIC PANEL
ALK PHOS: 73 IU/L (ref 39–117)
ALT: 9 IU/L (ref 0–32)
AST: 18 IU/L (ref 0–40)
Albumin/Globulin Ratio: 1.5 (ref 1.2–2.2)
Albumin: 4.4 g/dL (ref 3.5–5.5)
BUN/Creatinine Ratio: 22 (ref 9–23)
BUN: 16 mg/dL (ref 6–20)
Bilirubin Total: 0.3 mg/dL (ref 0.0–1.2)
CO2: 25 mmol/L (ref 20–29)
CREATININE: 0.73 mg/dL (ref 0.57–1.00)
Calcium: 8.8 mg/dL (ref 8.7–10.2)
Chloride: 100 mmol/L (ref 96–106)
GFR calc Af Amer: 122 mL/min/{1.73_m2} (ref >59)
GFR calc non Af Amer: 106 mL/min/{1.73_m2} (ref >59)
Globulin, Total: 2.9 g/dL (ref 1.5–4.5)
Glucose: 79 mg/dL (ref 65–99)
POTASSIUM: 4.4 mmol/L (ref 3.5–5.2)
Sodium: 139 mmol/L (ref 134–144)
Total Protein: 7.3 g/dL (ref 6.0–8.5)

## 2017-05-06 LAB — LIPID PANEL
Chol/HDL Ratio: 5.9 ratio — ABNORMAL HIGH (ref 0.0–4.4)
Cholesterol, Total: 230 mg/dL — ABNORMAL HIGH (ref 100–199)
HDL: 39 mg/dL — AB (ref >39)
LDL Calculated: 172 mg/dL — ABNORMAL HIGH (ref 0–99)
Triglycerides: 96 mg/dL (ref 0–149)
VLDL Cholesterol Cal: 19 mg/dL (ref 5–40)

## 2017-05-06 LAB — TSH: TSH: 3.48 u[IU]/mL (ref 0.450–4.500)

## 2017-05-06 LAB — VITAMIN D 25 HYDROXY (VIT D DEFICIENCY, FRACTURES): VIT D 25 HYDROXY: 4.8 ng/mL — AB (ref 30.0–100.0)

## 2017-05-06 LAB — CBC WITH DIFFERENTIAL/PLATELET
BASOS ABS: 0.1 10*3/uL (ref 0.0–0.2)
Basos: 1 %
EOS (ABSOLUTE): 0.2 10*3/uL (ref 0.0–0.4)
Eos: 2 %
Hematocrit: 31.5 % — ABNORMAL LOW (ref 34.0–46.6)
Hemoglobin: 9.6 g/dL — ABNORMAL LOW (ref 11.1–15.9)
IMMATURE GRANS (ABS): 0 10*3/uL (ref 0.0–0.1)
IMMATURE GRANULOCYTES: 0 %
LYMPHS: 40 %
Lymphocytes Absolute: 2.7 10*3/uL (ref 0.7–3.1)
MCH: 23.8 pg — ABNORMAL LOW (ref 26.6–33.0)
MCHC: 30.5 g/dL — ABNORMAL LOW (ref 31.5–35.7)
MCV: 78 fL — ABNORMAL LOW (ref 79–97)
MONOS ABS: 0.4 10*3/uL (ref 0.1–0.9)
Monocytes: 7 %
NEUTROS PCT: 50 %
Neutrophils Absolute: 3.3 10*3/uL (ref 1.4–7.0)
PLATELETS: 477 10*3/uL — AB (ref 150–379)
RBC: 4.03 x10E6/uL (ref 3.77–5.28)
RDW: 19.9 % — AB (ref 12.3–15.4)
WBC: 6.6 10*3/uL (ref 3.4–10.8)

## 2017-05-06 LAB — HEMOGLOBIN A1C
ESTIMATED AVERAGE GLUCOSE: 108 mg/dL
HEMOGLOBIN A1C: 5.4 % (ref 4.8–5.6)

## 2017-05-09 ENCOUNTER — Other Ambulatory Visit: Payer: Self-pay | Admitting: Adult Health

## 2017-05-09 DIAGNOSIS — E559 Vitamin D deficiency, unspecified: Secondary | ICD-10-CM

## 2017-05-09 DIAGNOSIS — R710 Precipitous drop in hematocrit: Secondary | ICD-10-CM

## 2017-05-09 DIAGNOSIS — R7989 Other specified abnormal findings of blood chemistry: Secondary | ICD-10-CM

## 2017-05-09 MED ORDER — VITAMIN D (ERGOCALCIFEROL) 1.25 MG (50000 UNIT) PO CAPS
50000.0000 [IU] | ORAL_CAPSULE | ORAL | 0 refills | Status: DC
Start: 2017-05-09 — End: 2019-12-20

## 2017-05-17 ENCOUNTER — Other Ambulatory Visit: Payer: Self-pay | Admitting: Adult Health

## 2017-05-17 ENCOUNTER — Other Ambulatory Visit: Payer: 59

## 2017-05-17 DIAGNOSIS — R7989 Other specified abnormal findings of blood chemistry: Secondary | ICD-10-CM

## 2017-05-17 DIAGNOSIS — D649 Anemia, unspecified: Secondary | ICD-10-CM

## 2017-05-17 DIAGNOSIS — R710 Precipitous drop in hematocrit: Secondary | ICD-10-CM

## 2017-05-17 DIAGNOSIS — E559 Vitamin D deficiency, unspecified: Secondary | ICD-10-CM

## 2017-05-17 NOTE — Progress Notes (Signed)
Subjective:    Patient ID: Rachel Bennett, female    DOB: 1981/02/04, 36 y.o.   MRN: [id number]  HPI: 9/13/18OV:  [name] presents to establish as a new pt.  She is a 36 year old female.  PMH: Chronic pain with long term narcotic therapy.  Chronic pain in lumbar back, bil hips, and pelvis.  She denies acute injury/trauma prior to pain developing and states "I was born with this pain".  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 She underwent PLIF L5-S1 3/23/1David Bennett/nuerosurgeon. She was eventually referred to [facility] and has been under their care for since 2012.  She was previously on Oxy/Acetaminphen 10/325 1 tab Q4H and Morphine ER 15mg  Q12H.  Then she was taken of both and started on Oxy 15mg  Q4-6H on 01/29/17.  She reports that the switch was "b/c the government says you can't be on 2 narcotics at the same time". She reports that the new med was causing burning tongue sensation and dysphagia when swallowing the tablet.  She denies this ever occurring before and denies hx of GERD.  She reports that when she went for her regular monthly f/u at the [facility] the provider refused to refill her meds due to new of dysphagia and required her to be evaluated by primary care. She has not had regular medical or dental care in >10 years. She has not had any narcotics since 02/26/17 and has been experiencing N/V/D/fever/sweats/chills/body aches/extremet fatigue/poor appetite/insomnia/chest tightness and severe pain in back/hips/pelvis. She smokes 1-1/2 pack and day and has been for >18 years. She denies any ETOH use. She reports 10lb weight loss since 02/26/17.  She has f/u with the [place] [facility] 03/09/17. She is married with two children ages 70,13.  Her husband is currently out of town, however her brother in law is local and has been assisting her as needed. She denies thoughts of harming herself/others. She reports feeling "absoluetly the worst I have ever felt  in my entire life".  05/09/17 OV: [name] here for CPE.  She returned to her [facility] and they determined that her dysphagia has resolved and she is safe to swallow tablets.  She is now taking Oxycodone/Acetaminopehn 10/325mg  Q4H and Cyclobenzaprine 10mg  BID- continues to have pain: Lumbar back 8/10, constant, aching Pelvis 9/10, constant, aching Bil hips 9/10, intermittent aching She smokes 1.5 pack day and only drinks 16 oz water/day, instead hydrates with Mnt Dew (2-3 bottles/day). She eats a diet high in CHO/Sugar, very little fruits/vegetables/protein.   She has not had dental care in years and denies any exercise/regular movement. She denies any cardiac/resp sx's. She continues to struggle with transportation, her husband works out of town with the only vehicle.  Patient Care Team    Relationship Specialty Notifications Start End  Esaw Grandchild, NP PCP - General Family Medicine  03/04/17     Patient Active Problem List   Diagnosis Date Noted  . 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 Encounter for insertion of mirena IUD 05/19/2017  . Atypical nevi 05/19/2017  . Screening examination for STD (sexually transmitted disease) 05/19/2017  . Tobacco use 05/19/2017  . High serum low density lipoprotein (LDL) cholesterol 05/19/2017  . Healthcare maintenance 05/19/2017  . Vitamin D deficiency 05/19/2017  . Iron deficiency anemia 05/18/2017  . Chronic bilateral low back pain 03/05/2017  . Dysphagia, idiopathic 03/05/2017  . Opiate withdrawal ([place]) 03/04/2017     Past Medical  History:  Diagnosis Date  . Chronic pain    back, hips and pelvis     Past Surgical History:  Procedure Laterality Date  . LUMBAR DISC SURGERY    . NASAL SEPTUM SURGERY       Family History  Problem Relation Age of Onset  . Arthritis Mother   . Anxiety disorder Mother   . Depression Mother   . Diabetes Mother   . Heart disease Mother   . Hyperlipidemia Mother   . Hypertension  Mother   . Thyroid disease Mother   . Birth defects Sister   . Migraines Sister   . Healthy Brother   . Healthy Daughter   . Healthy Sister   . Drug abuse Brother   . Healthy Brother   . Healthy Daughter      Social History   Substance and Sexual Activity  Drug Use No     Social History   Substance and Sexual Activity  Alcohol Use No     Social History   Tobacco Use  Smoking Status Current Every Day Smoker  . Packs/day: 1.00  . Years: 18.00  . Pack years: 18.00  . Types: Cigarettes  Smokeless Tobacco Never Used     Outpatient Encounter Medications as of 05/19/2017  Medication Sig  . cyclobenzaprine (FLEXERIL) 10 MG tablet Take 1 tablet by mouth 2 (two) times daily.  Marland Kitchen oxyCODONE-acetaminophen (PERCOCET) 10-325 MG tablet Take 1 tablet by mouth every 4 (four) hours.  . Vitamin D, Ergocalciferol, (DRISDOL) 50000 units CAPS capsule Take 1 capsule (50,000 Units total) every 7 (seven) days by mouth.  . [DISCONTINUED] LORazepam (ATIVAN) 0.5 MG tablet Take 1 tablet (0.5 mg total) by mouth every 8 (eight) hours as needed for anxiety or sleep.  . [DISCONTINUED] ondansetron (ZOFRAN-ODT) 8 MG disintegrating tablet Take 1 tablet (8 mg total) by mouth every 8 (eight) hours as needed for nausea or vomiting.  . [DISCONTINUED] oxyCODONE (ROXICODONE) 15 MG immediate release tablet Take 1 tablet by mouth every 5 (five) hours as needed.  . [DISCONTINUED] oxyCODONE-acetaminophen (PERCOCET) 10-325 MG per tablet Take 1 tablet by mouth QID.    . ferrous sulfate 325 (65 FE) MG tablet Take 1 tablet (325 mg total) by mouth 2 (two) times daily with a meal. Take Monday/Wednesday/Friday only  . [START ON 05/20/2017] ketoconazole (NIZORAL) 2 % shampoo Apply 1 application topically 2 (two) times a week.  . nicotine (NICODERM CQ - DOSED IN MG/24 HOURS) 21 mg/24hr patch Place 1 patch (21 mg total) onto the skin daily.   No facility-administered encounter medications on file as of 05/19/2017.      Allergies: Sulfa antibiotics  Body mass index is 21.53 kg/m.  Blood pressure 122/82, pulse (!) 106, height 5' 5.25" (1.657 m), weight 130 lb 6.4 oz (59.1 kg), last menstrual period 05/11/2017.    Patient Care Team    Relationship Specialty Notifications Start End  Esaw Grandchild, NP PCP - General Family Medicine  03/04/17     Patient Active Problem List   Diagnosis Date Noted  . 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 Encounter for insertion of mirena IUD 05/19/2017  . Atypical nevi 05/19/2017  . Screening examination for STD (sexually transmitted disease) 05/19/2017  . Tobacco use 05/19/2017  . High serum low density lipoprotein (LDL) cholesterol 05/19/2017  . Healthcare maintenance 05/19/2017  . Vitamin D deficiency 05/19/2017  . Iron deficiency anemia 05/18/2017  . Chronic bilateral low back pain 03/05/2017  . Dysphagia, idiopathic 03/05/2017  .  Opiate withdrawal ([place]) 03/04/2017     Past Medical History:  Diagnosis Date  . Chronic pain    back, hips and pelvis     Past Surgical History:  Procedure Laterality Date  . LUMBAR DISC SURGERY    . NASAL SEPTUM SURGERY       Family History  Problem Relation Age of Onset  . Arthritis Mother   . Anxiety disorder Mother   . Depression Mother   . Diabetes Mother   . Heart disease Mother   . Hyperlipidemia Mother   . Hypertension Mother   . Thyroid disease Mother   . Birth defects Sister   . Migraines Sister   . Healthy Brother   . Healthy Daughter   . Healthy Sister   . Drug abuse Brother   . Healthy Brother   . Healthy Daughter      Social History   Substance and Sexual Activity  Drug Use No     Social History   Substance and Sexual Activity  Alcohol Use No     Social History   Tobacco Use  Smoking Status Current Every Day Smoker  . Packs/day: 1.00  . Years: 18.00  . Pack years: 18.00  . Types: Cigarettes  Smokeless Tobacco Never Used     Outpatient Encounter Medications  as of 05/19/2017  Medication Sig  . cyclobenzaprine (FLEXERIL) 10 MG tablet Take 1 tablet by mouth 2 (two) times daily.  Marland Kitchen oxyCODONE-acetaminophen (PERCOCET) 10-325 MG tablet Take 1 tablet by mouth every 4 (four) hours.  . Vitamin D, Ergocalciferol, (DRISDOL) 50000 units CAPS capsule Take 1 capsule (50,000 Units total) every 7 (seven) days by mouth.  . [DISCONTINUED] LORazepam (ATIVAN) 0.5 MG tablet Take 1 tablet (0.5 mg total) by mouth every 8 (eight) hours as needed for anxiety or sleep.  . [DISCONTINUED] ondansetron (ZOFRAN-ODT) 8 MG disintegrating tablet Take 1 tablet (8 mg total) by mouth every 8 (eight) hours as needed for nausea or vomiting.  . [DISCONTINUED] oxyCODONE (ROXICODONE) 15 MG immediate release tablet Take 1 tablet by mouth every 5 (five) hours as needed.  . [DISCONTINUED] oxyCODONE-acetaminophen (PERCOCET) 10-325 MG per tablet Take 1 tablet by mouth QID.    . ferrous sulfate 325 (65 FE) MG tablet Take 1 tablet (325 mg total) by mouth 2 (two) times daily with a meal. Take Monday/Wednesday/Friday only  . [START ON 05/20/2017] ketoconazole (NIZORAL) 2 % shampoo Apply 1 application topically 2 (two) times a week.  . nicotine (NICODERM CQ - DOSED IN MG/24 HOURS) 21 mg/24hr patch Place 1 patch (21 mg total) onto the skin daily.   No facility-administered encounter medications on file as of 05/19/2017.     Allergies: Sulfa antibiotics  Body mass index is 21.53 kg/m.  Blood pressure 122/82, pulse (!) 106, height 5' 5.25" (1.657 m), weight 130 lb 6.4 oz (59.1 kg), last menstrual period 05/11/2017.    Review of Systems  Constitutional: Positive for fatigue. Negative for activity change, appetite change, chills, diaphoresis, fever and unexpected weight change.  HENT: Negative for congestion.   Eyes: Negative for visual disturbance.  Respiratory: Negative for cough, chest tightness, shortness of breath, wheezing and stridor.   Cardiovascular: Negative for chest pain,  palpitations and leg swelling.  Gastrointestinal: Positive for constipation. Negative for abdominal distention, abdominal pain, blood in stool, diarrhea, nausea and vomiting.  Endocrine: Negative for cold intolerance, heat intolerance, polydipsia, polyphagia and polyuria.  Genitourinary: Negative for difficulty urinating, flank pain and hematuria.  Musculoskeletal: Positive for arthralgias,  back pain, gait problem and myalgias. Negative for joint swelling, neck pain and neck stiffness.  Skin: Negative for color change, pallor, rash and wound.  Neurological: Negative for dizziness and headaches.  Hematological: Does not bruise/bleed easily.  Psychiatric/Behavioral: Positive for sleep disturbance. Negative for agitation, behavioral problems, confusion, decreased concentration, dysphoric mood, hallucinations, self-injury and suicidal ideas. The patient is not nervous/anxious and is not hyperactive.        Objective:   Physical Exam  Constitutional: She is oriented to person, place, and time. She appears well-developed and well-nourished. No distress.  HENT:  Head: Normocephalic and atraumatic.  Right Ear: Hearing, tympanic membrane, external ear and ear canal normal. Tympanic membrane is not erythematous and not bulging. No decreased hearing is noted.  Left Ear: Hearing, tympanic membrane, external ear and ear canal normal. Tympanic membrane is not erythematous and not bulging. No decreased hearing is noted.  Nose: Nose normal. Right sinus exhibits no maxillary sinus tenderness and no frontal sinus tenderness. Left sinus exhibits no maxillary sinus tenderness and no frontal sinus tenderness.  Mouth/Throat: Uvula is midline. Mucous membranes are pale and dry. Oral lesions present. Abnormal dentition. Dental caries present. No uvula swelling. Posterior oropharyngeal erythema present. No oropharyngeal exudate, posterior oropharyngeal edema or tonsillar abscesses.  Eyes: Conjunctivae are normal. Pupils  are equal, round, and reactive to light.  Neck: Normal range of motion. Neck supple.  Cardiovascular: Regular rhythm, S1 normal, S2 normal, normal heart sounds and intact distal pulses. Tachycardia present. Exam reveals no gallop.  No murmur heard. HR 102-142 EKG completed, results discussed at [facility]  Pulmonary/Chest: Effort normal and breath sounds normal. No respiratory distress. She has no wheezes. She has no rales. She exhibits no tenderness. Right breast exhibits no inverted nipple, no mass, no nipple discharge, no skin change and no tenderness. Left breast exhibits no inverted nipple, no mass, no nipple discharge, no skin change and no tenderness. Breasts are symmetrical.  Abdominal: Soft. Bowel sounds are normal. She exhibits no distension and no mass. There is no tenderness. There is no rigidity, no rebound, no guarding, no CVA tenderness, no tenderness at McBurney's point and negative Murphy's sign.  Genitourinary:  Genitourinary Comments: Declined PAP/Pelvic Examination  Musculoskeletal: She exhibits tenderness.       Right hip: She exhibits decreased range of motion and tenderness.       Left hip: She exhibits decreased range of motion and tenderness.       Lumbar back: She exhibits decreased range of motion and tenderness.  Very guarded when she changes positions or when first ambulating  Lymphadenopathy:    She has no cervical adenopathy.  Neurological: She is alert and oriented to person, place, and time. Coordination normal.  Skin: Skin is warm and dry. Rash noted. She is not diaphoretic. No erythema. No pallor.  Tinea capitis at base of skull and occipital head  One large atypical nevi on L parietal skull  Psychiatric: Her speech is normal and behavior is normal. Judgment and thought content normal. Her mood appears anxious. Cognition and memory are normal.  Nursing note and vitals reviewed.         Assessment & Plan:   1. Need for influenza vaccination   2. Need for Tdap  vaccination   3. Screening examination for STD (sexually transmitted disease)   4. Chronic radicular lumbar pain   5. Atypical nevi   6. Encounter for insertion of mirena IUD   7. Healthcare maintenance   8. Iron deficiency anemia,  unspecified iron deficiency anemia type   9. Tobacco use   10. High serum low density lipoprotein (LDL) cholesterol   11. Vitamin D deficiency     Atypical nevi Dermatology referral placed  Chronic radicular lumbar pain Declined referral to PT Continue with [facility], monthly follow-up Referral to Neurology placed  Encounter for insertion of mirena IUD Iron Def Anemia Heavy Menses Referral to OB/GYN placed  Healthcare maintenance Please take medications as directed and also start once daily multi-vitamin. Increase water intake, strive for at least 75 ounces/day.   Follow Heart Healthy diet. Increase regular exercise.  Recommend at least 30 minutes daily, 5 days per week of walking, jogging, biking, swimming, YouTube/Pinterest workout videos. We will call you when lab results are available. Please return in 3 months for fasting labs and follow-up.  Tobacco use Please use Nicoderm patch and work on reducing-stop tobacco use- YOU CAN DO IT!  High serum low density lipoprotein (LDL) cholesterol 05/05/17 Ref Range & Units 2wk ago  Cholesterol, Total 100 - 199 mg/dL 230 Abnormally high    Triglycerides 0 - 149 mg/dL 96   HDL >39 mg/dL 39 Abnormally low    VLDL Cholesterol Cal 5 - 40 mg/dL 19   LDL Calculated 0 - 99 mg/dL 172 Abnormally high    Chol/HDL Ratio 0.0 - 4.4 ratio 5.9 Abnormally high    Comment:                   ASCVD risk is 4.4%, opt 0.4% Advised to follow heart healthy diet, increase reg movement, and stop smoking Will re-check in 3 months  Vitamin D deficiency 05/17/17 Vit d - 4.2 Started on once weekly Vit d and also daily multi-vit Will re-check level in 3 months  Iron deficiency anemia Started on  Ferrous Sulfate 325mg  BID, Mon/Wed/Fri Increase iron in diet and referral to OB/GYN for IUD discussion (heavy menses)  Well over an hour spent with the pt discussing various medical conditions, treatment, and lifestyle modification  FOLLOW-UP:  Return in about 3 months (around 08/19/2017) for Regular Follow Up, tachycardia, Fasting Lab Draw, Musculoskeletal Pain, Evaluate Medication Effectiv.

## 2017-05-18 ENCOUNTER — Other Ambulatory Visit: Payer: Self-pay | Admitting: Adult Health

## 2017-05-18 DIAGNOSIS — D509 Iron deficiency anemia, unspecified: Secondary | ICD-10-CM

## 2017-05-18 HISTORY — DX: Iron deficiency anemia, unspecified: D50.9

## 2017-05-18 LAB — VITAMIN D 25 HYDROXY (VIT D DEFICIENCY, FRACTURES): Vit D, 25-Hydroxy: 4.2 ng/mL — ABNORMAL LOW (ref 30.0–100.0)

## 2017-05-18 LAB — CBC
HEMATOCRIT: 32.2 % — AB (ref 34.0–46.6)
HEMOGLOBIN: 9.6 g/dL — AB (ref 11.1–15.9)
MCH: 23.4 pg — ABNORMAL LOW (ref 26.6–33.0)
MCHC: 29.8 g/dL — ABNORMAL LOW (ref 31.5–35.7)
MCV: 79 fL (ref 79–97)
Platelets: 511 10*3/uL — ABNORMAL HIGH (ref 150–379)
RBC: 4.1 x10E6/uL (ref 3.77–5.28)
RDW: 18.9 % — ABNORMAL HIGH (ref 12.3–15.4)
WBC: 7.1 10*3/uL (ref 3.4–10.8)

## 2017-05-19 ENCOUNTER — Ambulatory Visit (INDEPENDENT_AMBULATORY_CARE_PROVIDER_SITE_OTHER): Payer: 59 | Admitting: Adult Health

## 2017-05-19 ENCOUNTER — Encounter: Payer: Self-pay | Admitting: Adult Health

## 2017-05-19 VITALS — BP 122/82 | HR 106 | Ht 65.25 in | Wt 130.4 lb

## 2017-05-19 DIAGNOSIS — E559 Vitamin D deficiency, unspecified: Secondary | ICD-10-CM

## 2017-05-19 DIAGNOSIS — D229 Melanocytic nevi, unspecified: Secondary | ICD-10-CM

## 2017-05-19 DIAGNOSIS — D509 Iron deficiency anemia, unspecified: Secondary | ICD-10-CM | POA: Diagnosis not present

## 2017-05-19 DIAGNOSIS — Z30430 Encounter for insertion of intrauterine contraceptive device: Secondary | ICD-10-CM

## 2017-05-19 DIAGNOSIS — G8929 Other chronic pain: Secondary | ICD-10-CM

## 2017-05-19 DIAGNOSIS — M5416 Radiculopathy, lumbar region: Secondary | ICD-10-CM | POA: Diagnosis not present

## 2017-05-19 DIAGNOSIS — Z113 Encounter for screening for infections with a predominantly sexual mode of transmission: Secondary | ICD-10-CM

## 2017-05-19 DIAGNOSIS — Z720 Tobacco use: Secondary | ICD-10-CM | POA: Diagnosis not present

## 2017-05-19 DIAGNOSIS — R7989 Other specified abnormal findings of blood chemistry: Secondary | ICD-10-CM

## 2017-05-19 DIAGNOSIS — Z23 Encounter for immunization: Secondary | ICD-10-CM | POA: Diagnosis not present

## 2017-05-19 DIAGNOSIS — Z0000 Encounter for general adult medical examination without abnormal findings: Secondary | ICD-10-CM

## 2017-05-19 HISTORY — DX: Encounter for screening for infections with a predominantly sexual mode of transmission: Z11.3

## 2017-05-19 HISTORY — DX: Encounter for insertion of intrauterine contraceptive device: Z30.430

## 2017-05-19 HISTORY — DX: Tobacco use: Z72.0

## 2017-05-19 HISTORY — DX: Other specified abnormal findings of blood chemistry: R79.89

## 2017-05-19 HISTORY — DX: Other chronic pain: G89.29

## 2017-05-19 HISTORY — DX: Melanocytic nevi, unspecified: D22.9

## 2017-05-19 HISTORY — DX: Encounter for general adult medical examination without abnormal findings: Z00.00

## 2017-05-19 HISTORY — DX: Vitamin D deficiency, unspecified: E55.9

## 2017-05-19 MED ORDER — FERROUS SULFATE 325 (65 FE) MG PO TABS: 325.0000 mg | ORAL_TABLET | Freq: Two times a day (BID) | ORAL | 3 refills | Status: DC

## 2017-05-19 MED ORDER — KETOCONAZOLE 2 % EX SHAM: 1.0000 "application " | MEDICATED_SHAMPOO | CUTANEOUS | 0 refills | Status: DC

## 2017-05-19 MED ORDER — NICOTINE 21 MG/24HR TD PT24: 21.0000 mg | MEDICATED_PATCH | Freq: Every day | TRANSDERMAL | 0 refills | Status: DC

## 2017-05-19 NOTE — Assessment & Plan Note (Addendum)
Please take medications as directed and also start once daily multi-vitamin. Increase water intake, strive for at least 75 ounces/day.   Follow Heart Healthy diet. Increase regular exercise.  Recommend at least 30 minutes daily, 5 days per week of walking, jogging, biking, swimming, YouTube/Pinterest workout videos. We will call you when lab results are available. Please return in 3 months for fasting labs and follow-up.

## 2017-05-19 NOTE — Assessment & Plan Note (Signed)
05/17/17 Vit d - 4.2 Started on once weekly Vit d and also daily multi-vit Will re-check level in 3 months

## 2017-05-19 NOTE — Patient Instructions (Signed)
Heart-Healthy Eating Plan Many factors influence your heart health, including eating and exercise habits. Heart (coronary) risk increases with abnormal blood fat (lipid) levels. Heart-healthy meal planning includes limiting unhealthy fats, increasing healthy fats, and making other small dietary changes. This includes maintaining a healthy body weight to help keep lipid levels within a normal range. What is my plan? Your health care provider recommends that you:  Get no more than __25___% of the total calories in your daily diet from fat.  Limit your intake of saturated fat to less than ___5___% of your total calories each day.  Limit the amount of cholesterol in your diet to less than __300__ mg per day.  What types of fat should I choose?  Choose healthy fats more often. Choose monounsaturated and polyunsaturated fats, such as olive oil and canola oil, flaxseeds, walnuts, almonds, and seeds.  Eat more omega-3 fats. Good choices include salmon, mackerel, sardines, tuna, flaxseed oil, and ground flaxseeds. Aim to eat fish at least two times each week.  Limit saturated fats. Saturated fats are primarily found in animal products, such as meats, butter, and cream. Plant sources of saturated fats include palm oil, palm kernel oil, and coconut oil.  Avoid foods with partially hydrogenated oils in them. These contain trans fats. Examples of foods that contain trans fats are stick margarine, some tub margarines, cookies, crackers, and other baked goods. What general guidelines do I need to follow?  Check food labels carefully to identify foods with trans fats or high amounts of saturated fat.  Fill one half of your plate with vegetables and green salads. Eat 4-5 servings of vegetables per day. A serving of vegetables equals 1 cup of raw leafy vegetables,  cup of raw or cooked cut-up vegetables, or  cup of vegetable juice.  Fill one fourth of your plate with whole grains. Look for the word "whole"  as the first word in the ingredient list.  Fill one fourth of your plate with lean protein foods.  Eat 4-5 servings of fruit per day. A serving of fruit equals one medium whole fruit,  cup of dried fruit,  cup of fresh, frozen, or canned fruit, or  cup of 100% fruit juice.  Eat more foods that contain soluble fiber. Examples of foods that contain this type of fiber are apples, broccoli, carrots, beans, peas, and barley. Aim to get 20-30 g of fiber per day.  Eat more home-cooked food and less restaurant, buffet, and fast food.  Limit or avoid alcohol.  Limit foods that are high in starch and sugar.  Avoid fried foods.  Cook foods by using methods other than frying. Baking, boiling, grilling, and broiling are all great options. Other fat-reducing suggestions include: ? Removing the skin from poultry. ? Removing all visible fats from meats. ? Skimming the fat off of stews, soups, and gravies before serving them. ? Steaming vegetables in water or broth.  Lose weight if you are overweight. Losing just 5-10% of your initial body weight can help your overall health and prevent diseases such as diabetes and heart disease.  Increase your consumption of nuts, legumes, and seeds to 4-5 servings per week. One serving of dried beans or legumes equals  cup after being cooked, one serving of nuts equals 1 ounces, and one serving of seeds equals  ounce or 1 tablespoon.  You may need to monitor your salt (sodium) intake, especially if you have high blood pressure. Talk with your health care provider or dietitian to get  more information about reducing sodium. What foods can I eat? Grains  Breads, including [place], white, pita, wheat, raisin, rye, oatmeal, and [place]. Tortillas that are neither fried nor made with lard or trans fat. Low-fat rolls, including hotdog and hamburger buns and English muffins. Biscuits. Muffins. Waffles. Pancakes. Light popcorn. Whole-grain cereals. Flatbread. Melba  toast. Pretzels. Breadsticks. Rusks. Low-fat snacks and crackers, including oyster, saltine, matzo, graham, animal, and rye. Rice and pasta, including brown rice and those that are made with whole wheat. Vegetables All vegetables. Fruits All fruits, but limit coconut. Meats and Other Protein Sources Lean, well-trimmed beef, veal, pork, and lamb. Chicken and [place] without skin. All fish and shellfish. Wild duck, rabbit, pheasant, and venison. Egg whites or low-cholesterol egg substitutes. Dried beans, peas, lentils, and tofu.Seeds and most nuts. Dairy Low-fat or nonfat cheeses, including ricotta, string, and mozzarella. Skim or 1% milk that is liquid, powdered, or evaporated. Buttermilk that is made with low-fat milk. Nonfat or low-fat yogurt. Beverages Mineral water. Diet carbonated beverages. Sweets and Desserts Sherbets and fruit ices. Honey, jam, marmalade, jelly, and syrups. Meringues and gelatins. Pure sugar candy, such as hard candy, jelly beans, gumdrops, mints, marshmallows, and small amounts of dark chocolate. [employer] Eat all sweets and desserts in moderation. Fats and Oils Nonhydrogenated (trans-free) margarines. Vegetable oils, including soybean, sesame, sunflower, olive, peanut, safflower, corn, canola, and cottonseed. Salad dressings or mayonnaise that are made with a vegetable oil. Limit added fats and oils that you use for cooking, baking, salads, and as spreads. Other Cocoa powder. Coffee and tea. All seasonings and condiments. The items listed above may not be a complete list of recommended foods or beverages. Contact your dietitian for more options. What foods are not recommended? Grains Breads that are made with saturated or trans fats, oils, or whole milk. Croissants. Butter rolls. Cheese breads. Sweet rolls. Donuts. Buttered popcorn. Chow mein noodles. High-fat crackers, such as cheese or butter crackers. Meats and Other Protein Sources Fatty meats, such as  hotdogs, short ribs, sausage, spareribs, bacon, ribeye roast or steak, and mutton. High-fat deli meats, such as salami and bologna. Caviar. Domestic duck and goose. Organ meats, such as kidney, liver, sweetbreads, brains, gizzard, chitterlings, and heart. Dairy Cream, sour cream, cream cheese, and creamed cottage cheese. Whole milk cheeses, including blue (bleu), Monterey Jack, [place], [place], American, [place], Swiss, [place], [place], and [place]. Whole or 2% milk that is liquid, evaporated, or condensed. Whole buttermilk. Cream sauce or high-fat cheese sauce. Yogurt that is made from whole milk. Beverages Regular sodas and drinks with added sugar. Sweets and Desserts Frosting. Pudding. Cookies. Cakes other than angel food cake. Candy that has milk chocolate or white chocolate, hydrogenated fat, butter, coconut, or unknown ingredients. Buttered syrups. Full-fat ice cream or ice cream drinks. Fats and Oils Gravy that has suet, meat fat, or shortening. Cocoa butter, hydrogenated oils, palm oil, coconut oil, palm kernel oil. These can often be found in baked products, candy, fried foods, nondairy creamers, and whipped toppings. Solid fats and shortenings, including bacon fat, salt pork, lard, and butter. Nondairy cream substitutes, such as coffee creamers and sour cream substitutes. Salad dressings that are made of unknown oils, cheese, or sour cream. The items listed above may not be a complete list of foods and beverages to avoid. Contact your dietitian for more information. This information is not intended to replace advice given to you by your health care provider. Make sure you discuss any questions you have with your health care  provider. Document Released: 03/17/2008 Document Revised: 12/27/2015 Document Reviewed: 11/30/2013 Elsevier Interactive Patient Education  2017 [employer].   Iron Deficiency Anemia, Adult Iron-deficiency anemia is when you have a low amount of red blood cells or  hemoglobin. This happens because you have too little iron in your body. Hemoglobin carries oxygen to parts of the body. Anemia can cause your body to not get enough oxygen. It may or may not cause symptoms. Follow these instructions at home: Medicines  Take over-the-counter and prescription medicines only as told by your doctor. This includes iron pills (supplements) and vitamins.  If you cannot handle taking iron pills by mouth, ask your doctor about getting iron through: ? A vein (intravenously). ? A shot (injection) into a muscle.  Take iron pills when your stomach is empty. If you cannot handle this, take them with food.  Do not drink milk or take antacids at the same time as your iron pills.  To prevent trouble pooping (constipation), eat fiber or take medicine (stool softener) as told by your doctor. Eating and drinking  Talk with your doctor before changing the foods you eat. He or she may tell you to eat foods that have a lot of iron, such as: ? Liver. ? Lowfat (lean) beef. ? Breads and cereals that have iron added to them (fortified breads and cereals). ? Eggs. ? Dried fruit. ? Dark green, leafy vegetables.  Drink enough fluid to keep your pee (urine) clear or pale yellow.  Eat fresh fruits and vegetables that are high in vitamin C. They help your body to use iron. Foods with a lot of vitamin C include: ? Oranges. ? Peppers. ? Tomatoes. ? Mangoes. General instructions  Return to your normal activities as told by your doctor. Ask your doctor what activities are safe for you.  Keep yourself clean, and keep things clean around you (your surroundings). Anemia can make you get sick more easily.  Keep all follow-up visits as told by your doctor. This is important. Contact a doctor if:  You feel sick to your stomach (nauseous).  You throw up (vomit).  You feel weak.  You are sweating for no clear reason.  You have trouble pooping, such as: ? Pooping (having a bowel  movement) less than 3 times a week. ? Straining to poop. ? Having poop that is hard, dry, or larger than normal. ? Feeling full or bloated. ? Pain in the lower belly. ? Not feeling better after pooping. Get help right away if:  You pass out (faint). If this happens, do not drive yourself to the hospital. Call your local emergency services (911 in the [place]  You have chest pain.  You have shortness of breath that: ? Is very bad. ? Gets worse with physical activity.  You have a fast heartbeat.  You get light-headed when getting up from sitting or lying down. This information is not intended to replace advice given to you by your health care provider. Make sure you discuss any questions you have with your health care provider. Document Released: 07/11/2010 Document Revised: 02/26/2016 Document Reviewed: 02/26/2016 Elsevier Interactive Patient Education  2017 [employer].  Please take medications as directed and also start once daily multi-vitamin. Please use Nicoderm patch and work on reducing-stop tobacco use- YOU CAN DO IT! Dermatology referral placed. Neurology referral placed. OB/GYN referral placed. Increase water intake, strive for at least 75 ounces/day.   Follow Heart Healthy diet. Increase regular exercise.  Recommend at least 30 minutes  daily, 5 days per week of walking, jogging, biking, swimming, YouTube/Pinterest workout videos. We will call you when lab results are available. Please return in 3 months for fasting labs and follow-up. NICE TO SEE YOU!

## 2017-05-19 NOTE — Assessment & Plan Note (Signed)
Started on Ferrous Sulfate 325mg  BID, Mon/Wed/Fri Increase iron in diet and referral to OB/GYN for IUD discussion (heavy menses)

## 2017-05-19 NOTE — Assessment & Plan Note (Signed)
Please use Nicoderm patch and work on reducing-stop tobacco use- YOU CAN DO IT!

## 2017-05-19 NOTE — Assessment & Plan Note (Signed)
Dermatology referral placed

## 2017-05-19 NOTE — Assessment & Plan Note (Addendum)
Declined referral to PT Continue with [facility], monthly follow-up Referral to Neurology placed

## 2017-05-19 NOTE — Assessment & Plan Note (Signed)
05/05/17 Ref Range & Units 2wk ago  Cholesterol, Total 100 - 199 mg/dL 230 Abnormally high    Triglycerides 0 - 149 mg/dL 96   HDL >39 mg/dL 39 Abnormally low    VLDL Cholesterol Cal 5 - 40 mg/dL 19   LDL Calculated 0 - 99 mg/dL 172 Abnormally high    Chol/HDL Ratio 0.0 - 4.4 ratio 5.9 Abnormally high    Comment:                   ASCVD risk is 4.4%, opt 0.4% Advised to follow heart healthy diet, increase reg movement, and stop smoking Will re-check in 3 months

## 2017-05-19 NOTE — Assessment & Plan Note (Signed)
Iron Def Anemia Heavy Menses Referral to OB/GYN placed

## 2017-05-20 ENCOUNTER — Encounter: Payer: 59 | Admitting: Adult Health

## 2017-05-20 LAB — SPECIMEN STATUS REPORT

## 2017-05-20 LAB — IRON AND TIBC
IRON SATURATION: 4 % — AB (ref 15–55)
Iron: 17 ug/dL — ABNORMAL LOW (ref 27–159)
TIBC: 421 ug/dL (ref 250–450)
UIBC: 404 ug/dL (ref 131–425)

## 2017-05-20 LAB — FERRITIN: Ferritin: 8 ng/mL — ABNORMAL LOW (ref 15–150)

## 2017-05-20 LAB — TRANSFERRIN: Transferrin: 352 mg/dL (ref 200–370)

## 2017-05-21 LAB — CHLAMYDIA/GONOCOCCUS/TRICHOMONAS, NAA
CHLAMYDIA BY NAA: NEGATIVE
Gonococcus by NAA: NEGATIVE
TRICH VAG BY NAA: NEGATIVE

## 2017-05-21 LAB — RPR: RPR Ser Ql: NONREACTIVE

## 2017-05-21 LAB — HSV(HERPES SIMPLEX VRS) I + II AB-IGG
HSV 1 Glycoprotein G Ab, IgG: 46.7 index — ABNORMAL HIGH (ref 0.00–0.90)
HSV 2 IgG, Type Spec: <0.91 index (ref 0.00–0.90)

## 2017-05-21 LAB — HSV(HERPES SIMPLEX VRS) I + II AB-IGM: HSVI/II Comb IgM: 2.26 Ratio — ABNORMAL HIGH (ref 0.00–0.90)

## 2017-05-21 LAB — HIV ANTIBODY (ROUTINE TESTING W REFLEX): HIV Screen 4th Generation wRfx: NONREACTIVE

## 2017-05-21 LAB — HEPATITIS C ANTIBODY: Hep C Virus Ab: <0.1 s/co ratio (ref 0.0–0.9)

## 2017-05-25 ENCOUNTER — Other Ambulatory Visit: Payer: Self-pay | Admitting: Adult Health

## 2017-05-25 DIAGNOSIS — Z113 Encounter for screening for infections with a predominantly sexual mode of transmission: Secondary | ICD-10-CM

## 2017-06-24 ENCOUNTER — Other Ambulatory Visit (INDEPENDENT_AMBULATORY_CARE_PROVIDER_SITE_OTHER): Payer: 59

## 2017-06-24 ENCOUNTER — Telehealth: Payer: Self-pay | Admitting: Adult Health

## 2017-06-24 DIAGNOSIS — D509 Iron deficiency anemia, unspecified: Secondary | ICD-10-CM

## 2017-06-24 DIAGNOSIS — E559 Vitamin D deficiency, unspecified: Secondary | ICD-10-CM

## 2017-06-24 DIAGNOSIS — R7989 Other specified abnormal findings of blood chemistry: Secondary | ICD-10-CM

## 2017-06-24 DIAGNOSIS — Z113 Encounter for screening for infections with a predominantly sexual mode of transmission: Secondary | ICD-10-CM

## 2017-06-24 NOTE — Telephone Encounter (Signed)
Patient is requesting a refill of her Vitamin D to be sent to [facility]

## 2017-06-24 NOTE — Telephone Encounter (Signed)
Informed pt that she should have enough medication to last until 08/2017.  Pt states that she didn't realize she had a refill on the medication and will call pharmacy.  Charyl Bigger, CMA

## 2017-06-25 LAB — LIPID PANEL
CHOLESTEROL TOTAL: 189 mg/dL (ref 100–199)
Chol/HDL Ratio: 5.4 ratio — ABNORMAL HIGH (ref 0.0–4.4)
HDL: 35 mg/dL — AB (ref >39)
LDL CALC: 119 mg/dL — AB (ref 0–99)
Triglycerides: 176 mg/dL — ABNORMAL HIGH (ref 0–149)
VLDL CHOLESTEROL CAL: 35 mg/dL (ref 5–40)

## 2017-06-25 LAB — CBC WITH DIFFERENTIAL/PLATELET
BASOS: 1 %
Basophils Absolute: 0 10*3/uL (ref 0.0–0.2)
EOS (ABSOLUTE): 0.1 10*3/uL (ref 0.0–0.4)
Eos: 2 %
HEMATOCRIT: 40.9 % (ref 34.0–46.6)
HEMOGLOBIN: 12.8 g/dL (ref 11.1–15.9)
IMMATURE GRANS (ABS): 0 10*3/uL (ref 0.0–0.1)
Immature Granulocytes: 0 %
LYMPHS: 36 %
Lymphocytes Absolute: 2.6 10*3/uL (ref 0.7–3.1)
MCH: 26.3 pg — ABNORMAL LOW (ref 26.6–33.0)
MCHC: 31.3 g/dL — ABNORMAL LOW (ref 31.5–35.7)
MCV: 84 fL (ref 79–97)
MONOCYTES: 5 %
Monocytes Absolute: 0.4 10*3/uL (ref 0.1–0.9)
NEUTROS ABS: 4.1 10*3/uL (ref 1.4–7.0)
Neutrophils: 56 %
Platelets: 322 10*3/uL (ref 150–379)
RBC: 4.86 x10E6/uL (ref 3.77–5.28)
RDW: 23.1 % — ABNORMAL HIGH (ref 12.3–15.4)
WBC: 7.3 10*3/uL (ref 3.4–10.8)

## 2017-06-25 LAB — IRON AND TIBC
IRON: 245 ug/dL — AB (ref 27–159)
Iron Saturation: 74 % — ABNORMAL HIGH (ref 15–55)
Total Iron Binding Capacity: 332 ug/dL (ref 250–450)
UIBC: 87 ug/dL — ABNORMAL LOW (ref 131–425)

## 2017-06-25 LAB — FERRITIN: Ferritin: 62 ng/mL (ref 15–150)

## 2017-06-25 LAB — HSV 2 ANTIBODY, IGG

## 2017-06-25 LAB — TRANSFERRIN: Transferrin: 276 mg/dL (ref 200–370)

## 2017-06-25 LAB — VITAMIN D 25 HYDROXY (VIT D DEFICIENCY, FRACTURES): VIT D 25 HYDROXY: 36.6 ng/mL (ref 30.0–100.0)

## 2017-06-29 ENCOUNTER — Other Ambulatory Visit: Payer: Self-pay | Admitting: Adult Health

## 2017-06-29 MED ORDER — FERROUS SULFATE 325 (65 FE) MG PO TABS: 325.0000 mg | ORAL_TABLET | Freq: Two times a day (BID) | ORAL | 1 refills | Status: DC

## 2019-12-20 ENCOUNTER — Encounter: Payer: Self-pay | Admitting: Physician Assistant

## 2019-12-20 ENCOUNTER — Other Ambulatory Visit: Payer: Self-pay

## 2019-12-20 ENCOUNTER — Ambulatory Visit (INDEPENDENT_AMBULATORY_CARE_PROVIDER_SITE_OTHER): Payer: 59 | Admitting: Physician Assistant

## 2019-12-20 VITALS — BP 89/57 | HR 125 | Temp 98.3°F | Ht 65.25 in | Wt 112.9 lb

## 2019-12-20 DIAGNOSIS — N926 Irregular menstruation, unspecified: Secondary | ICD-10-CM | POA: Diagnosis not present

## 2019-12-20 DIAGNOSIS — K122 Cellulitis and abscess of mouth: Secondary | ICD-10-CM

## 2019-12-20 DIAGNOSIS — N921 Excessive and frequent menstruation with irregular cycle: Secondary | ICD-10-CM | POA: Diagnosis not present

## 2019-12-20 DIAGNOSIS — D509 Iron deficiency anemia, unspecified: Secondary | ICD-10-CM | POA: Diagnosis not present

## 2019-12-20 DIAGNOSIS — H9313 Tinnitus, bilateral: Secondary | ICD-10-CM

## 2019-12-20 DIAGNOSIS — R5383 Other fatigue: Secondary | ICD-10-CM

## 2019-12-20 DIAGNOSIS — J342 Deviated nasal septum: Secondary | ICD-10-CM

## 2019-12-20 MED ORDER — DOXYCYCLINE HYCLATE 100 MG PO TABS: 100.0000 mg | ORAL_TABLET | Freq: Two times a day (BID) | ORAL | 0 refills | Status: DC

## 2019-12-20 NOTE — Progress Notes (Signed)
Established Patient Office Visit  Subjective:  Patient ID: Rachel Bennett, female    DOB: Jan 22, 1981  Age: 39 y.o. MRN: [id number]  CC:  Chief Complaint  Patient presents with   Tachycardia   Muscle Pain    HPI [name] presents for multiple complaints. Patient reports her periods have become more frequent. States bleeding is the same. Denies menorrhagia. Her last menstrual period was 11/29/19 and is currently spotting. She does feel tired. Reports a history of ovarian cyst with some of them rupturing. Sometimes she does have severe abdominal cramps.  Denies any recent severe pain. She also reports recurrent mouth abscesses. She has not been to the dentist due to a bad experience she had a while ago, which makes her anxious.  She also reports bilateral ear ringing for 18 months and is starting to have a hard time hearing.  She also complains of feeling like her right nostril is obstructed which interferes with her breathing, like a growth is present. Reports a history of iron deficiency anemia and was unable to tolerate iron supplement due to GI side effects.  Patient has not been seen since 2018 and is currently on Suboxone therapy.  Past Medical History:  Diagnosis Date   Chronic pain    back, hips and pelvis    Past Surgical History:  Procedure Laterality Date   LUMBAR DISC SURGERY     NASAL SEPTUM SURGERY      Family History  Problem Relation Age of Onset   Arthritis Mother    Anxiety disorder Mother    Depression Mother    Diabetes Mother    Heart disease Mother    Hyperlipidemia Mother    Hypertension Mother    Thyroid disease Mother    Birth defects Sister    Migraines Sister    Healthy Brother    Healthy Daughter    Healthy Sister    Drug abuse Brother    Healthy Brother    Healthy Daughter     Social History   Socioeconomic History   Marital status: Married    Spouse name: Not on file   Number of children: Not on file    Years of education: Not on file   Highest education level: Not on file  Occupational History   Not on file  Tobacco Use   Smoking status: Current Every Day Smoker    Packs/day: 1.00    Years: 18.00    Pack years: 18.00    Types: Cigarettes   Smokeless tobacco: Never Used  Scientific laboratory technician Use: Never used  Substance and Sexual Activity   Alcohol use: No   Drug use: No   Sexual activity: Never    Birth control/protection: None  Other Topics Concern   Not on file  Social History Narrative   Not on file   Social Determinants of Health   Financial Resource Strain:    Difficulty of Paying Living Expenses:   Food Insecurity:    Worried About Charity fundraiser in the Last Year:    Arboriculturist in the Last Year:   Transportation Needs:    Film/video editor (Medical):    Lack of Transportation (Non-Medical):   Physical Activity:    Days of Exercise per Week:    Minutes of Exercise per Session:   Stress:    Feeling of Stress :   Social Connections:    Frequency of Communication with Friends and Family:  Frequency of Social Gatherings with Friends and Family:    Attends Religious Services:    Active Member of Clubs or Organizations:    Attends Music therapist:    Marital Status:   Intimate Partner Violence:    Fear of Current or Ex-Partner:    Emotionally Abused:    Physically Abused:    Sexually Abused:     Outpatient Medications Prior to Visit  Medication Sig Dispense Refill   Buprenorphine HCl-Naloxone HCl 2-0.5 MG FILM Place 1 Film under the tongue every 6 (six) hours.     tiZANidine (ZANAFLEX) 4 MG tablet Take 4 mg by mouth every 8 (eight) hours.     cyclobenzaprine (FLEXERIL) 10 MG tablet Take 1 tablet by mouth 2 (two) times daily.     ferrous sulfate 325 (65 FE) MG tablet Take 1 tablet (325 mg total) by mouth 2 (two) times daily with a meal. Take Monday/Wednesday/Friday only 60 tablet 1   ketoconazole  (NIZORAL) 2 % shampoo Apply 1 application topically 2 (two) times a week. 120 mL 0   nicotine (NICODERM CQ - DOSED IN MG/24 HOURS) 21 mg/24hr patch Place 1 patch (21 mg total) onto the skin daily. 28 patch 0   oxyCODONE-acetaminophen (PERCOCET) 10-325 MG tablet Take 1 tablet by mouth every 4 (four) hours.     Vitamin D, Ergocalciferol, (DRISDOL) 50000 units CAPS capsule Take 1 capsule (50,000 Units total) every 7 (seven) days by mouth. 16 capsule 0   No facility-administered medications prior to visit.    Allergies  Allergen Reactions   Sulfa Antibiotics     ROS Review of Systems Review of Systems:  A fourteen system review of systems was performed and found to be positive as per HPI.   Objective:    Physical Exam General:  In no acute distress, petite appearance. Neuro:  Alert and oriented,  extra-ocular muscles intact  HEENT:  Normocephalic, atraumatic, neck supple, B/L middle ear effusion R>L, nasal septal deviation present, poor dentition and gums with inflammation Skin:  no gross rash, warm, pink. Cardiac:  RRR, S1 S2 Respiratory:  ECTA B/L and A/P, Not using accessory muscles, speaking in full sentences- unlabored. Abdomen: TTP of lower abdomen/pelvic area, NBS Vascular:  Ext warm, no cyanosis apprec.; cap RF less 2 sec. Psych:  No HI/SI, judgement and insight good, Euthymic mood. Full Affect.    BP (!) 89/57    Pulse (!) 125    Temp 98.3 F (36.8 C) (Oral)    Ht 5' 5.25" (1.657 m)    Wt 112 lb 14.4 oz (51.2 kg)    LMP 11/29/2019 (Exact Date)    SpO2 97% Comment: on RA   BMI 18.64 kg/m  Wt Readings from Last 3 Encounters:  12/20/19 112 lb 14.4 oz (51.2 kg)  05/19/17 130 lb 6.4 oz (59.1 kg)  03/04/17 109 lb 12.8 oz (49.8 kg)     Health Maintenance Due  Topic Date Due   COVID-19 Vaccine (1) Never done   PAP SMEAR-Modifier  Never done    There are no preventive care reminders to display for this patient.  Lab Results  Component Value Date   TSH 3.660  12/20/2019   Lab Results  Component Value Date   WBC 5.3 12/20/2019   HGB 12.7 12/20/2019   HCT 40.0 12/20/2019   MCV 81 12/20/2019   PLT 348 12/20/2019   Lab Results  Component Value Date   NA 141 12/20/2019   K 4.0 12/20/2019  CO2 26 12/20/2019   GLUCOSE 59 (L) 12/20/2019   BUN 11 12/20/2019   CREATININE 0.72 12/20/2019   BILITOT 0.3 12/20/2019   ALKPHOS 79 12/20/2019   AST 13 12/20/2019   ALT 7 12/20/2019   PROT 7.6 12/20/2019   ALBUMIN 4.8 12/20/2019   CALCIUM 9.7 12/20/2019   Lab Results  Component Value Date   CHOL 189 06/24/2017   Lab Results  Component Value Date   HDL 35 (L) 06/24/2017   Lab Results  Component Value Date   LDLCALC 119 (H) 06/24/2017   Lab Results  Component Value Date   TRIG 176 (H) 06/24/2017   Lab Results  Component Value Date   CHOLHDL 5.4 (H) 06/24/2017   Lab Results  Component Value Date   HGBA1C 5.4 05/05/2017      Assessment & Plan:   Problem List Items Addressed This Visit      Other   Iron deficiency anemia   Relevant Orders   CBC w/Diff (Completed)   Iron, TIBC and Ferritin Panel (Completed)    Other Visit Diagnoses    Irregular periods    -  Primary   Relevant Orders   US Pelvic Complete With Transvaginal (Completed)   Comp Met (CMET) (Completed)   TSH (Completed)   Metrorrhagia       Relevant Orders   US Pelvic Complete With Transvaginal (Completed)   CBC w/Diff (Completed)   Iron, TIBC and Ferritin Panel (Completed)   TSH (Completed)   Fatigue, unspecified type       Relevant Orders   CBC w/Diff (Completed)   Iron, TIBC and Ferritin Panel (Completed)   Comp Met (CMET) (Completed)   TSH (Completed)   Nasal septal deviation       Abscess of mouth- recurrent       Relevant Medications   doxycycline (VIBRA-TABS) 100 MG tablet   Tinnitus of both ears         Irregular periods, metrorrhagia: -Placed pelvic ultrasound and lab orders to evaluate for possible etiologies such as ovarian cyst,  fibroids, endometrial abnormalities, or thyroid disorder. -Symptoms could also be related to low caloric intake. -Pt has Ob-Gyn and recommend to follow-up for well woman examination with gynecological exam.   History of Iron deficiency anemia: -Checking iron panel and cbc today.   Fatigue: -Could be related to anemia, malnutrition, electrolyte imbalance, or thyroid disorder. -Placed lab orders.  Nasal septal deviation: -Has history of nasal septum surgery and deviation noted on exam, so will place referral to ENT.  B/L Tinnitus: -Placed referral to ENT for further evaluation. -Could be related to bilateral middle ear effusion.  Abscess of mouth, recurrent: -Poor oral care so recommend to get established with new dentist. -Will send prescription for doxycyline.   Meds ordered this encounter  Medications   doxycycline (VIBRA-TABS) 100 MG tablet    Sig: Take 1 tablet (100 mg total) by mouth 2 (two) times daily.    Dispense:  20 tablet    Refill:  0    Order Specific Question:   Supervising Provider    Answer:   Beatrice Lecher D [[id number]]    Follow-up: Return in about 3 months (around 03/21/2020) for CPE and FBW.   Note:  This note was prepared with assistance of Dragon voice recognition software. Occasional wrong-word or sound-a-like substitutions may have occurred due to the inherent limitations of voice recognition software.   Lorrene Reid, PA-C

## 2019-12-20 NOTE — Patient Instructions (Signed)
Metrorrhagia Metrorrhagia is bleeding from the uterus that happens irregularly but often. The bleeding generally happens between menstrual periods. Follow these instructions at home: Pay attention to any changes in your symptoms. Let your health care provider know about them. Follow these instructions to help with your condition: Eating and drinking   Eat well-balanced meals. Include foods that are high in iron, such as liver, meat, shellfish, green leafy vegetables, and eggs.  If you become constipated: ? Drink plenty of water. Drink enough to keep your urine pale yellow. ? Take over-the-counter or prescription medicines. ? Eat foods that are high in fiber, such as beans, whole grains, and fresh fruits and vegetables. ? Limit foods that are high in fat and processed sugars, such as fried or sweet foods. Medicines  Take over-the-counter and prescription medicines only as told by your health care provider.  Do not change medicines without talking with your health care provider.  Aspirin or medicines that contain aspirin may make the bleeding worse. Do not take these medicines: ? During your period. ? During the week before your period.  If you were prescribed iron pills, take them as told by your health care provider. Iron pills help to replace iron that your body loses because of this condition. Activity  If you need to change your sanitary pad or tampon more than one time every 2 hours: ? Lie in bed with your feet raised (elevated). ? Place a cold pack on your lower abdomen. ? Rest as much as possible until the bleeding stops or slows down. General instructions   For 2 months, write down: ? When your period starts. ? When your period ends. ? When any abnormal bleeding occurs. ? What problems you notice.  Keep all follow-up visits as told by your health care provider. This is important. Contact a health care provider if:  You get light-headed or weak.  You have nausea and  vomiting.  You cannot eat or drink without vomiting.  You feel dizzy or have diarrhea while you are taking medicine.  Have questions about birth control. Get help right away if:  You develop a fever or chills.  You need to change your sanitary pad or tampon more than one time per hour.  Your bleeding becomesheavy.  Your flow contains clots.  You develop pain in your abdomen.  You lose consciousness.  You develop a rash. Summary  Metrorrhagia is bleeding from the uterus that happens irregularly but often, usually between menstrual periods.  Pay attention to any changes in your symptoms. Let your health care provider know about them.  Eat well-balanced meals. Include foods that are high in iron, such as liver, meat, shellfish, green leafy vegetables, and eggs.  Get help right away if you develop a fever, you see clots in your blood, your bleeding becomes heavy, you develop a rash, or you lose consciousness. This information is not intended to replace advice given to you by your health care provider. Make sure you discuss any questions you have with your health care provider. Document Revised: 12/09/2017 Document Reviewed: 12/09/2017 Elsevier Patient Education  2020 [employer].

## 2019-12-21 ENCOUNTER — Telehealth: Payer: Self-pay | Admitting: Physician Assistant

## 2019-12-21 LAB — CBC WITH DIFFERENTIAL/PLATELET
Basophils Absolute: 0.1 10*3/uL (ref 0.0–0.2)
Basos: 2 %
EOS (ABSOLUTE): 0.1 10*3/uL (ref 0.0–0.4)
Eos: 3 %
Hematocrit: 40 % (ref 34.0–46.6)
Hemoglobin: 12.7 g/dL (ref 11.1–15.9)
Immature Grans (Abs): 0 10*3/uL (ref 0.0–0.1)
Immature Granulocytes: 0 %
Lymphocytes Absolute: 1.8 10*3/uL (ref 0.7–3.1)
Lymphs: 35 %
MCH: 25.7 pg — ABNORMAL LOW (ref 26.6–33.0)
MCHC: 31.8 g/dL (ref 31.5–35.7)
MCV: 81 fL (ref 79–97)
Monocytes Absolute: 0.5 10*3/uL (ref 0.1–0.9)
Monocytes: 9 %
Neutrophils Absolute: 2.7 10*3/uL (ref 1.4–7.0)
Neutrophils: 51 %
Platelets: 348 10*3/uL (ref 150–450)
RBC: 4.94 x10E6/uL (ref 3.77–5.28)
RDW: 15.8 % — ABNORMAL HIGH (ref 11.7–15.4)
WBC: 5.3 10*3/uL (ref 3.4–10.8)

## 2019-12-21 LAB — COMPREHENSIVE METABOLIC PANEL
ALT: 7 IU/L (ref 0–32)
AST: 13 IU/L (ref 0–40)
Albumin/Globulin Ratio: 1.7 (ref 1.2–2.2)
Albumin: 4.8 g/dL (ref 3.8–4.8)
Alkaline Phosphatase: 79 IU/L (ref 48–121)
BUN/Creatinine Ratio: 15 (ref 9–23)
BUN: 11 mg/dL (ref 6–20)
Bilirubin Total: 0.3 mg/dL (ref 0.0–1.2)
CO2: 26 mmol/L (ref 20–29)
Calcium: 9.7 mg/dL (ref 8.7–10.2)
Chloride: 99 mmol/L (ref 96–106)
Creatinine, Ser: 0.72 mg/dL (ref 0.57–1.00)
GFR calc Af Amer: 122 mL/min/{1.73_m2} (ref >59)
GFR calc non Af Amer: 106 mL/min/{1.73_m2} (ref >59)
Globulin, Total: 2.8 g/dL (ref 1.5–4.5)
Glucose: 59 mg/dL — ABNORMAL LOW (ref 65–99)
Potassium: 4 mmol/L (ref 3.5–5.2)
Sodium: 141 mmol/L (ref 134–144)
Total Protein: 7.6 g/dL (ref 6.0–8.5)

## 2019-12-21 LAB — IRON,TIBC AND FERRITIN PANEL
Ferritin: 9 ng/mL — ABNORMAL LOW (ref 15–150)
Iron Saturation: 6 % — CL (ref 15–55)
Iron: 26 ug/dL — ABNORMAL LOW (ref 27–159)
Total Iron Binding Capacity: 431 ug/dL (ref 250–450)
UIBC: 405 ug/dL (ref 131–425)

## 2019-12-21 LAB — TSH: TSH: 3.66 u[IU]/mL (ref 0.450–4.500)

## 2019-12-21 NOTE — Telephone Encounter (Signed)
Patient came by office states forgot to ask provider to place her on Chantix for smoking cession.  --Forwarding request to med asst that if approved send order to :     [place], [place] Phone:  [phone]  Fax:  [phone]     --glh

## 2019-12-21 NOTE — Telephone Encounter (Signed)
Pt advised.  Charyl Bigger, CMA

## 2019-12-21 NOTE — Telephone Encounter (Signed)
Patient was recently seen and had multiple concerns that were discussed and addressed and smoking cessation was not one of them, so she would need an appointment.   Thank you, Herb Grays

## 2020-01-02 ENCOUNTER — Ambulatory Visit
Admission: RE | Admit: 2020-01-02 | Discharge: 2020-01-02 | Disposition: A | Discharge status: Home / Self Care | Payer: 59 | Source: Ambulatory Visit | Attending: Physician Assistant | Admitting: Physician Assistant

## 2020-01-02 DIAGNOSIS — N926 Irregular menstruation, unspecified: Secondary | ICD-10-CM

## 2020-01-02 DIAGNOSIS — N921 Excessive and frequent menstruation with irregular cycle: Secondary | ICD-10-CM

## 2020-01-08 ENCOUNTER — Telehealth: Payer: Self-pay | Admitting: Physician Assistant

## 2020-01-08 DIAGNOSIS — N921 Excessive and frequent menstruation with irregular cycle: Secondary | ICD-10-CM

## 2020-01-08 DIAGNOSIS — R1010 Upper abdominal pain, unspecified: Secondary | ICD-10-CM

## 2020-01-08 DIAGNOSIS — N926 Irregular menstruation, unspecified: Secondary | ICD-10-CM

## 2020-01-08 DIAGNOSIS — R9389 Abnormal findings on diagnostic imaging of other specified body structures: Secondary | ICD-10-CM

## 2020-01-08 NOTE — Telephone Encounter (Signed)
-----   Message from Lorrene Reid, [place] sent at 01/08/2020  9:02 AM EDT ----- Please call Ms. Marietta and notify us results show simple cysts and thickness of endometrium is 10.7 mm, which is thicker than normal so recommend to follow-up with Ob-Gyn. If patient needs a referral, please place order.   Thank you, Herb Grays

## 2020-01-11 ENCOUNTER — Ambulatory Visit: Payer: 59 | Admitting: Obstetrics and Gynecology

## 2020-01-15 ENCOUNTER — Encounter: Payer: Self-pay | Admitting: Obstetrics and Gynecology

## 2020-01-15 ENCOUNTER — Other Ambulatory Visit: Payer: Self-pay

## 2020-01-15 ENCOUNTER — Ambulatory Visit: Payer: 59 | Admitting: Obstetrics and Gynecology

## 2020-01-15 VITALS — BP 118/76 | HR 90 | Ht 65.5 in | Wt 114.0 lb

## 2020-01-15 DIAGNOSIS — Z7185 Encounter for immunization safety counseling: Secondary | ICD-10-CM

## 2020-01-15 DIAGNOSIS — Z124 Encounter for screening for malignant neoplasm of cervix: Secondary | ICD-10-CM | POA: Diagnosis not present

## 2020-01-15 DIAGNOSIS — N939 Abnormal uterine and vaginal bleeding, unspecified: Secondary | ICD-10-CM

## 2020-01-15 DIAGNOSIS — Z7189 Other specified counseling: Secondary | ICD-10-CM

## 2020-01-15 DIAGNOSIS — N946 Dysmenorrhea, unspecified: Secondary | ICD-10-CM | POA: Diagnosis not present

## 2020-01-15 DIAGNOSIS — Z23 Encounter for immunization: Secondary | ICD-10-CM | POA: Diagnosis not present

## 2020-01-15 DIAGNOSIS — Z01419 Encounter for gynecological examination (general) (routine) without abnormal findings: Secondary | ICD-10-CM | POA: Diagnosis not present

## 2020-01-15 DIAGNOSIS — R9389 Abnormal findings on diagnostic imaging of other specified body structures: Secondary | ICD-10-CM

## 2020-01-15 DIAGNOSIS — F17200 Nicotine dependence, unspecified, uncomplicated: Secondary | ICD-10-CM

## 2020-01-15 NOTE — Progress Notes (Signed)
39 y.o. [id number] Married White or Caucasian Not Hispanic or Latino female. She was referred by Lorrene Reid for a consultation for abnormal uterine bleeding and a thickened endometrium. She requests an annual exam.  Period Pattern: (!) Irregular (has gone about 2 week between periods the last years and half) Menstrual Flow: Heavy Menstrual Control: Tampon, Maxi pad Menstrual Control Change Freq (Hours): 2-3 Dysmenorrhea: (!) Severe Dysmenorrhea Symptoms: Cramping  Cycles have always been heavy and had bad cramps.  In the last 1.5 years cycles have been every 3 weeks x 7-8 days. She has had one skipped cycle, one cycle where she bleed for a total of 22 days (normal flow for 8 days, the rest was spotting). Cramps have always been severe, last for 2-3 days of her cycles. No intermenstrual bleeding.  Intermittent deep dyspareunia. Only using w/d for contraception.    Recent CBC with a hgb of 12.7, TSH of 3.66.  Ultrasound from 01/03/20: IMPRESSION: 1. Endometrial stripe measures 10.7 mm in thickness. If bleeding remains unresponsive to hormonal or medical therapy, sonohysterogram should be considered for focal lesion work-up. (Ref: Radiological Reasoning: Algorithmic Workup of Abnormal Vaginal Bleeding with Endovaginal Sonography and Sonohysterography. AJR 2008; 672:C94-70). 2. 1.6 cm simple right ovarian cyst, most consistent with a normal physiologic follicular cyst/dominant follicle. Associated trace free fluid within the pelvis. 3. Otherwise unremarkable and normal pelvic ultrasound for age.   Patient's last menstrual period was 01/14/2020.          Sexually active: Yes.    The current method of family planning is none.    Exercising: No.  The patient does not participate in regular exercise at present. Smoker:  Yes, 2 packs/day  Health Maintenance: Pap:  2015 unsure  History of abnormal Pap:  Yes had cervical bx 2010. Thinks she had a colposcopy when she was pregnant.  MMG:   None  BMD:   None  Colonoscopy: none  TDaP:  05/19/17 Gardasil: no   reports that she has been smoking cigarettes. She has a 18.00 pack-year smoking history. She has never used smokeless tobacco. She reports that she does not drink alcohol and does not use drugs.  Homemaker. Daughters are 43 and 38. Her husband is currently working in [place] for a year.   Past Medical History:  Diagnosis Date  . Anemia   . Anxiety   . Chronic pain    back, hips and pelvis  . Depression   . Dysmenorrhea   . Endometriosis   . Ovarian cyst     Past Surgical History:  Procedure Laterality Date  . LUMBAR DISC SURGERY    . NASAL SEPTUM SURGERY      Current Outpatient Medications  Medication Sig Dispense Refill  . buprenorphine-naloxone (SUBOXONE) 2-0.5 mg SUBL SL tablet Place 2 tablets under the tongue daily.    Marland Kitchen tiZANidine (ZANAFLEX) 4 MG tablet Take 4 mg by mouth every 8 (eight) hours.     No current facility-administered medications for this visit.    Family History  Problem Relation Age of Onset  . Arthritis Mother   . Anxiety disorder Mother   . Depression Mother   . Diabetes Mother   . Heart disease Mother   . Hyperlipidemia Mother   . Hypertension Mother   . Thyroid disease Mother   . Birth defects Sister   . Migraines Sister   . Healthy Brother   . Healthy Daughter   . Healthy Sister   . Drug abuse Brother   .  Healthy Brother   . Healthy Daughter     Review of Systems  Gastrointestinal: Positive for abdominal pain.  Genitourinary: Positive for menstrual problem, vaginal bleeding, vaginal discharge and vaginal pain.    Exam:   BP 118/76   Pulse 90   Ht 5' 5.5" (1.664 m)   Wt 114 lb (51.7 kg)   LMP 01/14/2020   SpO2 98%   BMI 18.68 kg/m   Weight change: @WEIGHTCHANGE @ Height:   Height: 5' 5.5" (166.4 cm)  Ht Readings from Last 3 Encounters:  01/15/20 5' 5.5" (1.664 m)  12/20/19 5' 5.25" (1.657 m)  05/19/17 5' 5.25" (1.657 m)    General appearance: alert,  cooperative and appears stated age Head: Normocephalic, without obvious abnormality, atraumatic Neck: no adenopathy, supple, symmetrical, trachea midline and thyroid normal to inspection and palpation Lungs: clear to auscultation bilaterally Cardiovascular: regular rate and rhythm Breasts: normal appearance, no masses or tenderness Abdomen: soft, non-tender; non distended,  no masses,  no organomegaly Extremities: extremities normal, atraumatic, no cyanosis or edema Skin: Skin color, texture, turgor normal. No rashes or lesions Lymph nodes: Cervical, supraclavicular, and axillary nodes normal. No abnormal inguinal nodes palpated Neurologic: Grossly normal   Pelvic: External genitalia:  no lesions              Urethra:  normal appearing urethra with no masses, tenderness or lesions              Bartholins and Skenes: normal                 Vagina: normal appearing vagina with normal color and discharge, no lesions              Cervix: no lesions               Bimanual Exam:  Uterus:  normal size, contour, position, consistency, mobility, non-tender and retroverted              Adnexa: no mass, fullness, tenderness               Rectovaginal: Confirms               Anus:  normal sphincter tone, no lesions  Olene Floss chaperoned for the exam.  A:  Well Woman with normal exam  Cycles have gotten closer together, still within the realm of normal. Recent 3 week episode of bleeding and thickened endometrium on ultrasound.   Severe dysmenorrhea  Smoker  P:   Pap with hpv  # given to schedule a mammogram after she turns 39  Discussed breast self exam  Discussed calcium and vit D intake  Start gardasil series  Not a candidate for OCP's  Return for a sonohysterogram, further plans depending on results  We did discuss the mirena IUD for contraception and cycle control  She has ibuprofen to take for cramps.

## 2020-01-15 NOTE — Patient Instructions (Signed)

## 2020-01-16 ENCOUNTER — Other Ambulatory Visit (HOSPITAL_COMMUNITY)
Admission: RE | Admit: 2020-01-16 | Discharge: 2020-01-16 | Disposition: A | Discharge status: Home / Self Care | Payer: 59 | Source: Ambulatory Visit | Attending: Obstetrics and Gynecology | Admitting: Obstetrics and Gynecology

## 2020-01-16 DIAGNOSIS — Z124 Encounter for screening for malignant neoplasm of cervix: Secondary | ICD-10-CM | POA: Diagnosis not present

## 2020-01-16 NOTE — Addendum Note (Signed)
Addended by: Dorothy Spark on: 01/16/2020 10:12 AM   Modules accepted: Orders

## 2020-01-17 ENCOUNTER — Telehealth: Payer: Self-pay | Admitting: Obstetrics and Gynecology

## 2020-01-17 NOTE — Telephone Encounter (Signed)
Spoke with patient regarding benefits for recommended sonohysterogram. Patient acknowledges understanding of information presented. Patient is aware of cancellation policy. Patient scheduled appointment for 01/25/2020 at 1030AM with Sumner Boast, MD. Encounter closed.

## 2020-01-18 LAB — CYTOLOGY - PAP
Comment: NEGATIVE
Diagnosis: HIGH — AB
High risk HPV: POSITIVE — AB

## 2020-01-25 ENCOUNTER — Ambulatory Visit (INDEPENDENT_AMBULATORY_CARE_PROVIDER_SITE_OTHER): Payer: 59

## 2020-01-25 ENCOUNTER — Telehealth: Payer: Self-pay

## 2020-01-25 ENCOUNTER — Encounter: Payer: Self-pay | Admitting: Obstetrics and Gynecology

## 2020-01-25 ENCOUNTER — Ambulatory Visit (INDEPENDENT_AMBULATORY_CARE_PROVIDER_SITE_OTHER): Payer: 59 | Admitting: Obstetrics and Gynecology

## 2020-01-25 ENCOUNTER — Other Ambulatory Visit: Payer: Self-pay

## 2020-01-25 ENCOUNTER — Other Ambulatory Visit: Payer: Self-pay | Admitting: Obstetrics and Gynecology

## 2020-01-25 VITALS — BP 110/60 | HR 82 | Ht 65.0 in | Wt 115.0 lb

## 2020-01-25 DIAGNOSIS — R87613 High grade squamous intraepithelial lesion on cytologic smear of cervix (HGSIL): Secondary | ICD-10-CM

## 2020-01-25 DIAGNOSIS — Z3009 Encounter for other general counseling and advice on contraception: Secondary | ICD-10-CM

## 2020-01-25 DIAGNOSIS — N939 Abnormal uterine and vaginal bleeding, unspecified: Secondary | ICD-10-CM

## 2020-01-25 DIAGNOSIS — R9389 Abnormal findings on diagnostic imaging of other specified body structures: Secondary | ICD-10-CM

## 2020-01-25 DIAGNOSIS — N840 Polyp of corpus uteri: Secondary | ICD-10-CM | POA: Diagnosis not present

## 2020-01-25 DIAGNOSIS — N946 Dysmenorrhea, unspecified: Secondary | ICD-10-CM

## 2020-01-25 DIAGNOSIS — N3281 Overactive bladder: Secondary | ICD-10-CM

## 2020-01-25 NOTE — Progress Notes (Signed)
GYNECOLOGY  VISIT   HPI: 39 y.o.   Married White or Caucasian Not Hispanic or Latino  female   [id number] with Patient's last menstrual period was 01/14/2020.   here for ultrasound consult for abnormal uterine bleeding and a thickened endometrium on outside ultrasound. They recommended consideration of sonohysterogram.     She cycles every 3 weeks x 7-8 days, one skipped cycle and one cycle for 22 days. Severe cramps.   GYNECOLOGIC HISTORY: Patient's last menstrual period was 01/14/2020. Contraception: none other than WD Menopausal hormone therapy: none        OB History    Gravida  2   Para  2   Term  2   Preterm      AB      Living  2     SAB      TAB      Ectopic      Multiple      Live Births  2              Patient Active Problem List   Diagnosis Date Noted  . 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 Encounter for insertion of mirena IUD 05/19/2017  . Atypical nevi 05/19/2017  . Screening examination for STD (sexually transmitted disease) 05/19/2017  . Tobacco use 05/19/2017  . High serum low density lipoprotein (LDL) cholesterol 05/19/2017  . Healthcare maintenance 05/19/2017  . Vitamin D deficiency 05/19/2017  . Iron deficiency anemia 05/18/2017  . Chronic bilateral low back pain 03/05/2017  . Dysphagia, idiopathic 03/05/2017  . Opiate withdrawal ([place]) 03/04/2017    Past Medical History:  Diagnosis Date  . Anemia   . Anxiety   . Chronic pain    back, hips and pelvis  . Depression   . Dysmenorrhea   . Endometriosis   . Ovarian cyst     Past Surgical History:  Procedure Laterality Date  . LUMBAR DISC SURGERY    . NASAL SEPTUM SURGERY      Current Outpatient Medications  Medication Sig Dispense Refill  . buprenorphine-naloxone (SUBOXONE) 2-0.5 mg SUBL SL tablet Place 2 tablets under the tongue daily.    Marland Kitchen tiZANidine (ZANAFLEX) 4 MG tablet Take 4 mg by mouth every 8 (eight) hours.     No current facility-administered  medications for this visit.     ALLERGIES: Sulfa antibiotics  Family History  Problem Relation Age of Onset  . Arthritis Mother   . Anxiety disorder Mother   . Depression Mother   . Diabetes Mother   . Heart disease Mother   . Hyperlipidemia Mother   . Hypertension Mother   . Thyroid disease Mother   . Birth defects Sister   . Migraines Sister   . Healthy Brother   . Healthy Daughter   . Healthy Sister   . Drug abuse Brother   . Healthy Brother   . Healthy Daughter     Social History   Socioeconomic History  . Marital status: Married    Spouse name: Not on file  . Number of children: Not on file  . Years of education: Not on file  . Highest education level: Not on file  Occupational History  . Not on file  Tobacco Use  . Smoking status: Current Every Day Smoker    Packs/day: 1.00    Years: 18.00    Pack years: 18.00    Types: Cigarettes  . Smokeless tobacco: Never Used  Vaping Use  . Vaping Use:  Never used  Substance and Sexual Activity  . Alcohol use: No  . Drug use: No  . Sexual activity: Yes    Birth control/protection: None  Other Topics Concern  . Not on file  Social History Narrative  . Not on file   Social Determinants of Health   Financial Resource Strain:   . Difficulty of Paying Living Expenses:   Food Insecurity:   . Worried About Charity fundraiser in the Last Year:   . Arboriculturist in the Last Year:   Transportation Needs:   . Film/video editor (Medical):   Marland Kitchen Lack of Transportation (Non-Medical):   Physical Activity:   . Days of Exercise per Week:   . Minutes of Exercise per Session:   Stress:   . Feeling of Stress :   Social Connections:   . Frequency of Communication with Friends and Family:   . Frequency of Social Gatherings with Friends and Family:   . Attends Religious Services:   . Active Member of Clubs or Organizations:   . Attends Archivist Meetings:   Marland Kitchen Marital Status:   Intimate Partner Violence:     . Fear of Current or Ex-Partner:   . Emotionally Abused:   Marland Kitchen Physically Abused:   . Sexually Abused:     Review of Systems  All other systems reviewed and are negative. She has some urgency to void only with her cycle, can leak on the way to the bathroom. Doesn't happen any other time. Has been going on for years.   PHYSICAL EXAMINATION:    BP 110/60   Pulse 82   Ht 5\' 5"  (1.651 m)   Wt 115 lb (52.2 kg)   LMP 01/14/2020   SpO2 99%   BMI 19.14 kg/m     General appearance: alert, cooperative and appears stated age Neck: no adenopathy, supple, symmetrical, trachea midline and no masses  Heart: regular rate and rhythm Lungs: CTAB Abdomen: soft, non-tender; bowel sounds normal; no masses,  no organomegaly Extremities: normal, atraumatic, no cyanosis Skin: normal color, texture and turgor, no rashes or lesions Lymph: normal cervical supraclavicular and inguinal nodes Neurologic: grossly normal   ASSESSMENT AUB Severe dysmenorrhea Endometrial polyp on ultrasound, sonohysterogram not needed.  Needs contraception, discussed the mirena IUD including risks of insertion and side effects.  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 Unable to give a urine sample     PLAN Plan: hysteroscopy, polypectomy, dilation and curettage. Reviewed risks, including: bleeding, infection, uterine perforation, fluid overload, need for further sugery Will plan mirena IUD insertion in the OR We discussed OAB, information given. If her symptoms persist after mirena IUD insertion, then she could go to PT or try medication.    ACOG handouts given.   ~20 minutes spent in total patient care in addition to reviewing the ultrasound results. This included counseling and management as well as coordination of care.

## 2020-01-25 NOTE — Patient Instructions (Addendum)
Kegel Exercises  Kegel exercises can help strengthen your pelvic floor muscles. The pelvic floor is a group of muscles that support your rectum, small intestine, and bladder. In females, pelvic floor muscles also help support the womb (uterus). These muscles help you control the flow of urine and stool. Kegel exercises are painless and simple, and they do not require any equipment. Your provider may suggest Kegel exercises to:  Improve bladder and bowel control.  Improve sexual response.  Improve weak pelvic floor muscles after surgery to remove the uterus (hysterectomy) or pregnancy (females).  Improve weak pelvic floor muscles after prostate gland removal or surgery (males). Kegel exercises involve squeezing your pelvic floor muscles, which are the same muscles you squeeze when you try to stop the flow of urine or keep from passing gas. The exercises can be done while sitting, standing, or lying down, but it is best to vary your position. Exercises How to do Kegel exercises: 1. Squeeze your pelvic floor muscles tight. You should feel a tight lift in your rectal area. If you are a female, you should also feel a tightness in your vaginal area. Keep your stomach, buttocks, and legs relaxed. 2. Hold the muscles tight for up to 10 seconds. 3. Breathe normally. 4. Relax your muscles. 5. Repeat as told by your health care provider. Repeat this exercise daily as told by your health care provider. Continue to do this exercise for at least 4-6 weeks, or for as long as told by your health care provider. You may be referred to a physical therapist who can help you learn more about how to do Kegel exercises. Depending on your condition, your health care provider may recommend:  Varying how long you squeeze your muscles.  Doing several sets of exercises every day.  Doing exercises for several weeks.  Making Kegel exercises a part of your regular exercise routine. This information is not intended  to replace advice given to you by your health care provider. Make sure you discuss any questions you have with your health care provider. Document Revised: 01/26/2018 Document Reviewed: 01/26/2018 Elsevier Patient Education  2020 Elsevier Inc.   Overactive Bladder, Adult  Overactive bladder refers to a condition in which a person has a sudden need to pass urine. The person may leak urine if he or she cannot get to the bathroom fast enough (urinary incontinence). A person with this condition may also wake up several times in the night to go to the bathroom. Overactive bladder is associated with poor nerve signals between your bladder and your brain. Your bladder may get the signal to empty before it is full. You may also have very sensitive muscles that make your bladder squeeze too soon. These symptoms might interfere with daily work or social activities. What are the causes? This condition may be associated with or caused by:  Urinary tract infection.  Infection of nearby tissues, such as the prostate.  Prostate enlargement.  Surgery on the uterus or urethra.  Bladder stones, inflammation, or tumors.  Drinking too much caffeine or alcohol.  Certain medicines, especially medicines that get rid of extra fluid in the body (diuretics).  Muscle or nerve weakness, especially from: ? A spinal cord injury. ? Stroke. ? Multiple sclerosis. ? Parkinson's disease.  Diabetes.  Constipation. What increases the risk? You may be at greater risk for overactive bladder if you:  Are an older adult.  Smoke.  Are going through menopause.  Have prostate problems.  Have a neurological disease,   such as stroke, dementia, Parkinson's disease, or multiple sclerosis (MS).  Eat or drink things that irritate the bladder. These include alcohol, spicy food, and caffeine.  Are overweight or obese. What are the signs or symptoms? Symptoms of this condition include:  Sudden, strong urge to  urinate.  Leaking urine.  Urinating 8 or more times a day.  Waking up to urinate 2 or more times a night. How is this diagnosed? Your health care provider may suspect overactive bladder based on your symptoms. He or she will diagnose this condition by:  A physical exam and medical history.  Blood or urine tests. You might need bladder or urine tests to help determine what is causing your overactive bladder. You might also need to see a health care provider who specializes in urinary tract problems (urologist). How is this treated? Treatment for overactive bladder depends on the cause of your condition and whether it is mild or severe. You can also make lifestyle changes at home. Options include:  Bladder training. This may include: ? Learning to control the urge to urinate by following a schedule that directs you to urinate at regular intervals (timed voiding). ? Doing Kegel exercises to strengthen your pelvic floor muscles, which support your bladder. Toning these muscles can help you control urination, even if your bladder muscles are overactive.  Special devices. This may include: ? Biofeedback, which uses sensors to help you become aware of your body's signals. ? Electrical stimulation, which uses electrodes placed inside the body (implanted) or outside the body. These electrodes send gentle pulses of electricity to strengthen the nerves or muscles that control the bladder. ? Women may use a plastic device that fits into the vagina and supports the bladder (pessary).  Medicines. ? Antibiotics to treat bladder infection. ? Antispasmodics to stop the bladder from releasing urine at the wrong time. ? Tricyclic antidepressants to relax bladder muscles. ? Injections of botulinum toxin type A directly into the bladder tissue to relax bladder muscles.  Lifestyle changes. This may include: ? Weight loss. Talk to your health care provider about weight loss methods that would work best for  you. ? Diet changes. This may include reducing how much alcohol and caffeine you consume, or drinking fluids at different times of the day. ? Not smoking. Do not use any products that contain nicotine or tobacco, such as cigarettes and e-cigarettes. If you need help quitting, ask your health care provider.  Surgery. ? A device may be implanted to help manage the nerve signals that control urination. ? An electrode may be implanted to stimulate electrical signals in the bladder. ? A procedure may be done to change the shape of the bladder. This is done only in very severe cases. Follow these instructions at home: Lifestyle  Make any diet or lifestyle changes that are recommended by your health care provider. These may include: ? Drinking less fluid or drinking fluids at different times of the day. ? Cutting down on caffeine or alcohol. ? Doing Kegel exercises. ? Losing weight if needed. ? Eating a healthy and balanced diet to prevent constipation. This may include:  Eating foods that are high in fiber, such as fresh fruits and vegetables, whole grains, and beans.  Limiting foods that are high in fat and processed sugars, such as fried and sweet foods. General instructions  Take over-the-counter and prescription medicines only as told by your health care provider.  If you were prescribed an antibiotic medicine, take it as told by   your health care provider. Do not stop taking the antibiotic even if you start to feel better.  Use any implants or pessary as told by your health care provider.  If needed, wear pads to absorb urine leakage.  Keep a journal or log to track how much and when you drink and when you feel the need to urinate. This will help your health care provider monitor your condition.  Keep all follow-up visits as told by your health care provider. This is important. Contact a health care provider if:  You have a fever.  Your symptoms do not get better with  treatment.  Your pain and discomfort get worse.  You have more frequent urges to urinate. Get help right away if:  You are not able to control your bladder. Summary  Overactive bladder refers to a condition in which a person has a sudden need to pass urine.  Several conditions may lead to an overactive bladder.  Treatment for overactive bladder depends on the cause and severity of your condition.  Follow your health care provider's instructions about lifestyle changes, doing Kegel exercises, keeping a journal, and taking medicines. This information is not intended to replace advice given to you by your health care provider. Make sure you discuss any questions you have with your health care provider. Document Revised: 09/29/2018 Document Reviewed: 06/24/2017 Elsevier Patient Education  2020 Elsevier Inc.  

## 2020-01-25 NOTE — Telephone Encounter (Signed)
-----   Message from Salvadore Dom, MD sent at 01/24/2020  5:03 PM EDT ----- Please inform and schedule a colposcopy.

## 2020-01-25 NOTE — Telephone Encounter (Signed)
Left message for pt to return call to triage RN. 

## 2020-01-30 NOTE — Telephone Encounter (Signed)
Left message for pt to return call to triage RN. 

## 2020-01-31 NOTE — Telephone Encounter (Signed)
Spoke with patient. Advised of pap results and recommendations per Dr. Talbert Nan.  Colpo procedure explained.  LMP 01/14/20.  Not currently SA, [place] is [place] of the country per patient.   Patient is requesting Colpo be completed at time of hysteroscopy, polypectomy and D&C.   Advised colpo likely needs to be completed prior to surgery, will review with Dr. Talbert Nan and return call, patient agreeable. Patient request detailed message on mobile number if no answer.   Dr. Crawford Givens -please review and advise.

## 2020-02-01 NOTE — Telephone Encounter (Signed)
To [facility] for precert.

## 2020-02-01 NOTE — Telephone Encounter (Signed)
Call placed to convey benefits. Spoke with the patient and conveyed the benefits. Patient understands/agreeable with the benefits. Patient is aware of the cancellation policy. Appointment scheduled 02/05/20.

## 2020-02-01 NOTE — Telephone Encounter (Signed)
She likely needs a leep, I would recommend colpo prior to her surgery and then if needed the leep can be done at the time of her surgery.

## 2020-02-01 NOTE — Telephone Encounter (Signed)
Spoke with patient. Advised per Dr. Talbert Nan. Patient agreeable to proceed with colpo.  Colpo scheduled for 02/05/20 at 11:30am. Advised to take Motrin 800 mg with food and water one hour before procedure.  Patient was initially planning for surgery on 8/24, would like to wait for results on colpo and review alternative surgery dates. She is unsure at this time when her spouse will be returning home. Advised I will forward to [facility] and [place] [facility] for return call to review benefits and surgery planning. Patient agreeable.   Colpo order placed.   Routing to [employer]  Cc: Reesa Chew, RN

## 2020-02-02 NOTE — Telephone Encounter (Signed)
Spoke with patient regarding surgery benefits. Patient acknowledges understanding of information presented. Patient is aware that benefits presented are for professional benefits only. Patient is aware that once surgery is scheduled, the hospital will call with separate benefits. Patient is aware of surgery cancellation policy.  Patient stated that she has Colpo scheduled for Monday with Dr. Talbert Nan and depending on the results, she may need a LEEP procedure done in the OR as well. Informed patient that LEEP precerted with professional benefits.   Patient stated that tentative surgery date of August 24th will no longer work for her.   Informed patient that I would let Kaitlyn and Dr. Talbert Nan know that she would like to wait for Colpo results to proceed with scheduling. Patient agreeable.

## 2020-02-05 ENCOUNTER — Telehealth: Payer: Self-pay

## 2020-02-05 ENCOUNTER — Ambulatory Visit: Payer: Self-pay | Admitting: Obstetrics and Gynecology

## 2020-02-05 NOTE — Telephone Encounter (Signed)
Patient cancelled procedure appointment today due to having flat tire, patient cannot make it.

## 2020-02-05 NOTE — Telephone Encounter (Signed)
Spoke with patient. Colpo r/s for 8/18 at 1:30pm with Dr. Talbert Nan.  Advised to take Motrin 800 mg with food and water one hour before procedure. Patient verbalizes understanding and is agreeable.   Routing to [employer].   Encounter closed.

## 2020-02-07 ENCOUNTER — Other Ambulatory Visit (HOSPITAL_COMMUNITY)
Admission: RE | Admit: 2020-02-07 | Discharge: 2020-02-07 | Disposition: A | Discharge status: Home / Self Care | Payer: 59 | Source: Ambulatory Visit | Attending: Obstetrics and Gynecology | Admitting: Obstetrics and Gynecology

## 2020-02-07 ENCOUNTER — Other Ambulatory Visit: Payer: Self-pay

## 2020-02-07 ENCOUNTER — Encounter: Payer: Self-pay | Admitting: Obstetrics and Gynecology

## 2020-02-07 ENCOUNTER — Ambulatory Visit (INDEPENDENT_AMBULATORY_CARE_PROVIDER_SITE_OTHER): Payer: 59 | Admitting: Otolaryngology

## 2020-02-07 ENCOUNTER — Ambulatory Visit (INDEPENDENT_AMBULATORY_CARE_PROVIDER_SITE_OTHER): Payer: 59 | Admitting: Obstetrics and Gynecology

## 2020-02-07 VITALS — BP 118/64 | HR 106 | Ht 65.0 in | Wt 110.0 lb

## 2020-02-07 DIAGNOSIS — H9313 Tinnitus, bilateral: Secondary | ICD-10-CM

## 2020-02-07 DIAGNOSIS — R87613 High grade squamous intraepithelial lesion on cytologic smear of cervix (HGSIL): Secondary | ICD-10-CM

## 2020-02-07 DIAGNOSIS — B977 Papillomavirus as the cause of diseases classified elsewhere: Secondary | ICD-10-CM

## 2020-02-07 DIAGNOSIS — Z01812 Encounter for preprocedural laboratory examination: Secondary | ICD-10-CM | POA: Diagnosis not present

## 2020-02-07 DIAGNOSIS — J342 Deviated nasal septum: Secondary | ICD-10-CM

## 2020-02-07 LAB — POCT URINE PREGNANCY: Preg Test, Ur: NEGATIVE

## 2020-02-07 NOTE — Patient Instructions (Signed)
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
## 2020-02-07 NOTE — Progress Notes (Signed)
GYNECOLOGY  VISIT   HPI: 39 y.o.   Married White or Caucasian Not Hispanic or Latino  female   [phone] with Patient's last menstrual period was 01/14/2020.   here for colposcopy for HSIL pap with +HPV. UPT: Negative  She was evaluated earlier this month for severe dysmenorrhea, AUB, thickened endometrium. U/S concerning for a polyp. Planning hysteroscopy, D&C and mirena IUD insertion.   GYNECOLOGIC HISTORY: Patient's last menstrual period was 01/14/2020. Contraception:none other than WD Menopausal hormone therapy: none         OB History    Gravida  2   Para  2   Term  2   Preterm      AB      Living  2     SAB      TAB      Ectopic      Multiple      Live Births  2              Patient Active Problem List   Diagnosis Date Noted   Chronic radicular lumbar pain 05/19/2017   Encounter for insertion of mirena IUD 05/19/2017   Atypical nevi 05/19/2017   Screening examination for STD (sexually transmitted disease) 05/19/2017   Tobacco use 05/19/2017   High serum low density lipoprotein (LDL) cholesterol 05/19/2017   Healthcare maintenance 05/19/2017   Vitamin D deficiency 05/19/2017   Iron deficiency anemia 05/18/2017   Chronic bilateral low back pain 03/05/2017   Dysphagia, idiopathic 03/05/2017   Opiate withdrawal ([place]) 03/04/2017    Past Medical History:  Diagnosis Date   Anemia    Anxiety    Chronic pain    back, hips and pelvis   Depression    Dysmenorrhea    Endometriosis    Ovarian cyst     Past Surgical History:  Procedure Laterality Date   LUMBAR DISC SURGERY     NASAL SEPTUM SURGERY      Current Outpatient Medications  Medication Sig Dispense Refill   buprenorphine-naloxone (SUBOXONE) 2-0.5 mg SUBL SL tablet Place 2 tablets under the tongue daily.     tiZANidine (ZANAFLEX) 4 MG tablet Take 4 mg by mouth every 8 (eight) hours.     No current facility-administered medications for this visit.     ALLERGIES:  Sulfa antibiotics  Family History  Problem Relation Age of Onset   Arthritis Mother    Anxiety disorder Mother    Depression Mother    Diabetes Mother    Heart disease Mother    Hyperlipidemia Mother    Hypertension Mother    Thyroid disease Mother    Birth defects Sister    Migraines Sister    Healthy Brother    Healthy Daughter    Healthy Sister    Drug abuse Brother    Healthy Brother    Healthy Daughter     Social History   Socioeconomic History   Marital status: Married    Spouse name: Not on file   Number of children: Not on file   Years of education: Not on file   Highest education level: Not on file  Occupational History   Not on file  Tobacco Use   Smoking status: Current Every Day Smoker    Packs/day: 1.00    Years: 18.00    Pack years: 18.00    Types: Cigarettes   Smokeless tobacco: Never Used  Scientific laboratory technician Use: Never used  Substance and Sexual Activity   Alcohol  use: No   Drug use: No   Sexual activity: Yes    Birth control/protection: None  Other Topics Concern   Not on file  Social History Narrative   Not on file   Social Determinants of Health   Financial Resource Strain:    Difficulty of Paying Living Expenses:   Food Insecurity:    Worried About Charity fundraiser in the Last Year:    Arboriculturist in the Last Year:   Transportation Needs:    Film/video editor (Medical):    Lack of Transportation (Non-Medical):   Physical Activity:    Days of Exercise per Week:    Minutes of Exercise per Session:   Stress:    Feeling of Stress :   Social Connections:    Frequency of Communication with Friends and Family:    Frequency of Social Gatherings with Friends and Family:    Attends Religious Services:    Active Member of Clubs or Organizations:    Attends Music therapist:    Marital Status:   Intimate Partner Violence:    Fear of Current or Ex-Partner:     Emotionally Abused:    Physically Abused:    Sexually Abused:     Review of Systems  All other systems reviewed and are negative.   PHYSICAL EXAMINATION:    BP 118/64    Pulse (!) 106    Ht 5\' 5"  (1.651 m)    Wt 110 lb (49.9 kg)    LMP 01/14/2020    SpO2 99%    BMI 18.30 kg/m     General appearance: alert, cooperative and appears stated age Colposcopy: satisfactory, aceto-white changes on the anterior cervical lip and on part of the posterior cervical lip. Mosaics noted anteriorly. Cervical biopsies at 1,5 and 11 o'clock. Peripheral to the T zone at 10 o'clock is a raised, white lesion, biopsy taken. ECC done. Lugols examination with several small areas of decreased lugols uptake at 12, 1, 3 and 4 o'clock, biopsy taken at 3 o'clock. Biopsy sites treated with silver nitrate and monsels.   Chaperone was present for exam.  ASSESSMENT HSIL pap with +HPV. Colposcopy with cervical and vaginal biopsies done.     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 Can do this in the OR at the time of her D&C.    An After Visit Summary was printed and given to the patient.

## 2020-02-12 ENCOUNTER — Telehealth: Payer: Self-pay

## 2020-02-12 LAB — SURGICAL PATHOLOGY

## 2020-02-12 NOTE — Telephone Encounter (Signed)
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 The vaginal biopsy is benign.  She needs a leep, can schedule this at the same time as the hysteroscopy, D&C, mirena IUD insertion CC: Kaitlyn [facility]

## 2020-02-12 NOTE — Telephone Encounter (Signed)
Left message for pt to return call to triage RN. 

## 2020-02-13 NOTE — Telephone Encounter (Signed)
Patient returned a call to Stephanie. 

## 2020-02-13 NOTE — Telephone Encounter (Signed)
Spoke with pt. Pt given results and recommendations per Dr Talbert Nan. Pt agreeable with procedures but also wanting to know if can have a hysterectomy?  Pt advised will review with Dr Talbert Nan and return call. Pt agreeable.   Pt states if has any procedure or surgery, would prefer in Oct due to husband coming home from [place] for month of Sept.   Routing to Dr Talbert Nan.

## 2020-02-14 NOTE — Telephone Encounter (Signed)
Please let the patient know that she could have a hysterectomy if desired, but LEEP would be recommended first to r/o any invasive disease. If she had any microinvasive disease the type of hysterectomy would be different.  We could do a leep and endometrial biopsy in the office and then plan hysterectomy 6 weeks later, or she could proceed with the scheduled surgery and proceed with hysterectomy if the mirena doesn't control her symptoms.

## 2020-02-15 NOTE — Telephone Encounter (Signed)
Spoke with pt. Pt given recommendations per Dr Talbert Nan. Pt states would like to discuss more with Dr Talbert Nan with video visit when husband is home from [place]. Pt unsure what is best for her. Pt scheduled with Dr Talbert Nan for video visit on 03/04/20 at 430 pm. Pt agreeable and verbalized understanding to date and time of appt.   Routing to Dr Talbert Nan for update  Encounter closed.

## 2020-02-28 ENCOUNTER — Ambulatory Visit (INDEPENDENT_AMBULATORY_CARE_PROVIDER_SITE_OTHER): Payer: 59 | Admitting: Otolaryngology

## 2020-02-28 ENCOUNTER — Other Ambulatory Visit: Payer: Self-pay

## 2020-02-28 ENCOUNTER — Encounter (INDEPENDENT_AMBULATORY_CARE_PROVIDER_SITE_OTHER): Payer: Self-pay | Admitting: Otolaryngology

## 2020-02-28 VITALS — Temp 97.3°F

## 2020-02-28 DIAGNOSIS — H9313 Tinnitus, bilateral: Secondary | ICD-10-CM
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
## 2020-02-29 ENCOUNTER — Encounter (INDEPENDENT_AMBULATORY_CARE_PROVIDER_SITE_OTHER): Payer: Self-pay

## 2020-03-04 ENCOUNTER — Telehealth (INDEPENDENT_AMBULATORY_CARE_PROVIDER_SITE_OTHER): Payer: 59 | Admitting: Obstetrics and Gynecology

## 2020-03-04 ENCOUNTER — Other Ambulatory Visit: Payer: Self-pay

## 2020-03-04 ENCOUNTER — Encounter: Payer: Self-pay | Admitting: Obstetrics and Gynecology

## 2020-03-04 DIAGNOSIS — D069 Carcinoma in situ of cervix, unspecified: Secondary | ICD-10-CM

## 2020-03-04 DIAGNOSIS — N946 Dysmenorrhea, unspecified: Secondary | ICD-10-CM | POA: Diagnosis not present

## 2020-03-04 DIAGNOSIS — N840 Polyp of corpus uteri: Secondary | ICD-10-CM

## 2020-03-04 DIAGNOSIS — N939 Abnormal uterine and vaginal bleeding, unspecified: Secondary | ICD-10-CM

## 2020-03-04 NOTE — Progress Notes (Signed)
Virtual Visit via Video Note  I connected with Rachel Bennett on 03/04/20 at  4:30 PM EDT by a video enabled telemedicine application and verified that I am speaking with the correct person using two identifiers.  Location: Patient: Home Provider: Office at [facility].    I discussed the limitations of evaluation and management by telemedicine and the availability of in person appointments. The patient expressed understanding and agreed to proceed.  GYNECOLOGY  VISIT   HPI: 39 y.o.   Married White or Caucasian Not Hispanic or Latino  female   [phone] with No LMP recorded.   here for a virtual visit to discuss surgery. She has a h/o AUB and severe dysmenorrhea. Endometrial polyp on ultrasound.  Original plan was for hysteroscopy, polypectomy, D&C mirena IUD insertion. She then had an abnormal pap, HGSIL, +HPV. Colposcopy with CIN II-III. Recommended LEEP at the time of the above surgery. The patient was questioning if she could have a hysterectomy.  The patient would need a leep and endometrial biopsy prior to hysterectomy.   The patient is on Suboxone and is wondering about pain management after a hysterectomy.   GYNECOLOGIC HISTORY: No LMP recorded. Contraception: withdrawal Menopausal hormone therapy: none        OB History    Gravida  2   Para  2   Term  2   Preterm      AB      Living  2     SAB      TAB      Ectopic      Multiple      Live Births  2              Patient Active Problem List   Diagnosis Date Noted  . 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 Encounter for insertion of mirena IUD 05/19/2017  . Atypical nevi 05/19/2017  . Screening examination for STD (sexually transmitted disease) 05/19/2017  . Tobacco use 05/19/2017  . High serum low density lipoprotein (LDL) cholesterol 05/19/2017  . Healthcare maintenance 05/19/2017  . Vitamin D deficiency 05/19/2017  . Iron deficiency anemia 05/18/2017  . Chronic bilateral  low back pain 03/05/2017  . Dysphagia, idiopathic 03/05/2017  . Opiate withdrawal ([place]) 03/04/2017    Past Medical History:  Diagnosis Date  . Anemia   . Anxiety   . Chronic pain    back, hips and pelvis  . Depression   . Dysmenorrhea   . Endometriosis   . Ovarian cyst     Past Surgical History:  Procedure Laterality Date  . LUMBAR DISC SURGERY    . NASAL SEPTUM SURGERY      Current Outpatient Medications  Medication Sig Dispense Refill  . buprenorphine-naloxone (SUBOXONE) 2-0.5 mg SUBL SL tablet Place 2 tablets under the tongue daily.    Marland Kitchen tiZANidine (ZANAFLEX) 4 MG tablet Take 4 mg by mouth every 8 (eight) hours.     No current facility-administered medications for this visit.     ALLERGIES: Sulfa antibiotics  Family History  Problem Relation Age of Onset  . Arthritis Mother   . Anxiety disorder Mother   . Depression Mother   . Diabetes Mother   . Heart disease Mother   . Hyperlipidemia Mother   . Hypertension Mother   . Thyroid disease Mother   . Birth defects Sister   . Migraines Sister   . Healthy Brother   . Healthy Daughter   . Healthy Sister   .  Drug abuse Brother   . Healthy Brother   . Healthy Daughter     Social History   Socioeconomic History  . Marital status: Married    Spouse name: Not on file  . Number of children: Not on file  . Years of education: Not on file  . Highest education level: Not on file  Occupational History  . Not on file  Tobacco Use  . Smoking status: Former Smoker    Packs/day: 1.00    Years: 23.00    Pack years: 23.00    Types: Cigarettes    Quit date: 1998    Years since quitting: 23.7  . Smokeless tobacco: Never Used  Vaping Use  . Vaping Use: Never used  Substance and Sexual Activity  . Alcohol use: No  . Drug use: No  . Sexual activity: Yes    Birth control/protection: None  Other Topics Concern  . Not on file  Social History Narrative  . Not on file   Social Determinants of Health   Financial  Resource Strain:   . Difficulty of Paying Living Expenses: Not on file  Food Insecurity:   . Worried About Charity fundraiser in the Last Year: Not on file  . Ran Out of Food in the Last Year: Not on file  Transportation Needs:   . Lack of Transportation (Medical): Not on file  . Lack of Transportation (Non-Medical): Not on file  Physical Activity:   . Days of Exercise per Week: Not on file  . Minutes of Exercise per Session: Not on file  Stress:   . Feeling of Stress : Not on file  Social Connections:   . Frequency of Communication with Friends and Family: Not on file  . Frequency of Social Gatherings with Friends and Family: Not on file  . Attends Religious Services: Not on file  . Active Member of Clubs or Organizations: Not on file  . Attends Archivist Meetings: Not on file  . Marital Status: Not on file  Intimate Partner Violence:   . Fear of Current or Ex-Partner: Not on file  . Emotionally Abused: Not on file  . Physically Abused: Not on file  . Sexually Abused: Not on file    ROS  PHYSICAL EXAMINATION:    There were no vitals taken for this visit.    General appearance: alert, cooperative and appears stated age  ASSESSMENT H/O AUB and severe dysmenorrhea. 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 She prefers definitive surgery We discussed hysterectomy, including risks and recovery She desires hysterectomy. She will need to come in for a leep and endometrial biopsy, will then set up TLH/BS, possible treatment of endometriosis and cystoscopy Will need to talk with her pain management provider about post op pain relief.       I discussed the assessment and treatment plan with the patient. The patient was provided an opportunity to ask questions and all were answered. The patient agreed with the plan and demonstrated an understanding  of the instructions.   The patient was advised to call back or seek an in-person evaluation if the symptoms worsen or if the condition fails to improve as anticipated.  Approximately 20 minutes was spent in total patient care.   Salvadore Dom, MD

## 2020-03-07 ENCOUNTER — Telehealth: Payer: Self-pay | Admitting: Obstetrics and Gynecology

## 2020-03-07 NOTE — Telephone Encounter (Signed)
Call placed to convey benefits for LEEP and endometrial biopsy.

## 2020-03-18 ENCOUNTER — Ambulatory Visit: Payer: 59

## 2020-03-19 ENCOUNTER — Other Ambulatory Visit: Payer: Self-pay

## 2020-03-19 ENCOUNTER — Ambulatory Visit (INDEPENDENT_AMBULATORY_CARE_PROVIDER_SITE_OTHER): Payer: 59

## 2020-03-19 VITALS — BP 102/60 | HR 63 | Ht 65.0 in | Wt 107.0 lb

## 2020-03-19 DIAGNOSIS — Z23 Encounter for immunization: Secondary | ICD-10-CM

## 2020-03-19 NOTE — Progress Notes (Signed)
Patient in today for 2nd Gardasil injection.   Contraception: none LMP: 03/13/20 Last AEX: 01/15/20 with Dr. Talbert Nan  Injection given in RD. Patient tolerated shot well.   Patient informed next injection due in about 4 months.  Advised patient, if not on birth control, to return for next injection with cycle.   Routed to provider for final review.  Encounter closed.

## 2020-03-19 NOTE — Telephone Encounter (Signed)
Spoke with pt. Pt scheduled LEEP and EMB bx on 04/09/20 at 1030 am.  Pt verbalized understanding date and time of appt. Cancellation policy reviewed.  Pt states was told to have something for anxiety prior to procedure. Pt states will have driver to and from appt. Advised will review with Dr Talbert Nan.   Pt advised to take Motrin 800 mg with food and water one hour before procedure.Pt agreeable.    Routing to Dr Talbert Nan. Please advise for Rx for anxiety. Pharmacy verified.  Cc: Rosa for update  Orders placed on 03/04/20

## 2020-03-26 ENCOUNTER — Encounter: Payer: 59 | Admitting: Physician Assistant

## 2020-04-08 ENCOUNTER — Other Ambulatory Visit: Payer: 59

## 2020-04-08 ENCOUNTER — Telehealth: Payer: Self-pay

## 2020-04-08 ENCOUNTER — Other Ambulatory Visit: Payer: Self-pay

## 2020-04-08 DIAGNOSIS — E538 Deficiency of other specified B group vitamins: Secondary | ICD-10-CM

## 2020-04-08 DIAGNOSIS — E618 Deficiency of other specified nutrient elements: Secondary | ICD-10-CM

## 2020-04-08 DIAGNOSIS — G894 Chronic pain syndrome: Secondary | ICD-10-CM

## 2020-04-08 DIAGNOSIS — M2550 Pain in unspecified joint: Secondary | ICD-10-CM

## 2020-04-08 DIAGNOSIS — E531 Pyridoxine deficiency: Secondary | ICD-10-CM

## 2020-04-08 MED ORDER — LORAZEPAM 1 MG PO TABS: ORAL_TABLET | ORAL | 0 refills | Status: DC

## 2020-04-08 NOTE — Telephone Encounter (Signed)
Pt asking for Rx before LEEP procedure on 04/09/20. Pt has taken Ativan Rx in the past. Pt has driver to and from appt. See phone encounter dated 03/07/20 when LEEP was scheduled.   Routing to Dr Talbert Nan for review, please advise.

## 2020-04-08 NOTE — Progress Notes (Signed)
Labs for [facility]

## 2020-04-08 NOTE — Telephone Encounter (Signed)
Patient is calling in regards to having a medication called in for "anxiety/pain for procedure tomorrow". Patient states it has not been sent to pharmacy.

## 2020-04-08 NOTE — Telephone Encounter (Signed)
Ativan sent

## 2020-04-09 ENCOUNTER — Ambulatory Visit (INDEPENDENT_AMBULATORY_CARE_PROVIDER_SITE_OTHER): Payer: 59 | Admitting: Obstetrics and Gynecology

## 2020-04-09 ENCOUNTER — Encounter: Payer: Self-pay | Admitting: Obstetrics and Gynecology

## 2020-04-09 ENCOUNTER — Other Ambulatory Visit (HOSPITAL_COMMUNITY)
Admission: RE | Admit: 2020-04-09 | Discharge: 2020-04-09 | Disposition: A | Discharge status: Home / Self Care | Payer: 59 | Source: Ambulatory Visit | Attending: Obstetrics and Gynecology | Admitting: Obstetrics and Gynecology

## 2020-04-09 ENCOUNTER — Other Ambulatory Visit: Payer: Self-pay

## 2020-04-09 VITALS — BP 122/74 | HR 129 | Ht 65.5 in | Wt 106.0 lb

## 2020-04-09 DIAGNOSIS — E538 Deficiency of other specified B group vitamins: Secondary | ICD-10-CM

## 2020-04-09 DIAGNOSIS — Z01812 Encounter for preprocedural laboratory examination: Secondary | ICD-10-CM | POA: Diagnosis not present

## 2020-04-09 DIAGNOSIS — M2550 Pain in unspecified joint: Secondary | ICD-10-CM

## 2020-04-09 DIAGNOSIS — N939 Abnormal uterine and vaginal bleeding, unspecified: Secondary | ICD-10-CM | POA: Insufficient documentation

## 2020-04-09 DIAGNOSIS — E618 Deficiency of other specified nutrient elements: Secondary | ICD-10-CM

## 2020-04-09 DIAGNOSIS — D069 Carcinoma in situ of cervix, unspecified: Secondary | ICD-10-CM

## 2020-04-09 DIAGNOSIS — G894 Chronic pain syndrome: Secondary | ICD-10-CM

## 2020-04-09 LAB — POCT URINE PREGNANCY: Preg Test, Ur: NEGATIVE

## 2020-04-09 NOTE — Progress Notes (Signed)
GYNECOLOGY  VISIT   HPI: 39 y.o.   Married White or Caucasian Not Hispanic or Latino  female   [phone] with Patient's last menstrual period was 03/13/2020.   here for a leep procedure and endometrial biopsy. H/O CIN II-III, h/o AUB. She has a h/o AUB and severe dysmenorrhea. Endometrial polyp on ultrasound.  Original plan was for hysteroscopy, polypectomy, D&C mirena IUD insertion. She has decided she wants definitive surgery.  Patient has taken 1 MG of ativan.    GYNECOLOGIC HISTORY: Patient's last menstrual period was 03/13/2020. Contraception: none  Menopausal hormone therapy: none         OB History    Gravida  2   Para  2   Term  2   Preterm      AB      Living  2     SAB      TAB      Ectopic      Multiple      Live Births  2              Patient Active Problem List   Diagnosis Date Noted  . 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 Encounter for insertion of mirena IUD 05/19/2017  . Atypical nevi 05/19/2017  . Screening examination for STD (sexually transmitted disease) 05/19/2017  . Tobacco use 05/19/2017  . High serum low density lipoprotein (LDL) cholesterol 05/19/2017  . Healthcare maintenance 05/19/2017  . Vitamin D deficiency 05/19/2017  . Iron deficiency anemia 05/18/2017  . Chronic bilateral low back pain 03/05/2017  . Dysphagia, idiopathic 03/05/2017  . Opiate withdrawal ([place]) 03/04/2017    Past Medical History:  Diagnosis Date  . Anemia   . Anxiety   . Chronic pain    back, hips and pelvis  . Depression   . Dysmenorrhea   . Endometriosis   . Ovarian cyst     Past Surgical History:  Procedure Laterality Date  . LUMBAR DISC SURGERY    . NASAL SEPTUM SURGERY      Current Outpatient Medications  Medication Sig Dispense Refill  . Buprenorphine HCl-Naloxone HCl 4-1 MG FILM SMARTSIG:1 Strip(s) Sublingual Every 6 Hours    . LORazepam (ATIVAN) 1 MG tablet Take one tablet one hour prior to the procedure. 1 tablet 0  .  ondansetron (ZOFRAN) 4 MG tablet Take 4 mg by mouth as needed.    Marland Kitchen tiZANidine (ZANAFLEX) 4 MG tablet Take 4 mg by mouth every 8 (eight) hours.     No current facility-administered medications for this visit.     ALLERGIES: Sulfa antibiotics  Family History  Problem Relation Age of Onset  . Arthritis Mother   . Anxiety disorder Mother   . Depression Mother   . Diabetes Mother   . Heart disease Mother   . Hyperlipidemia Mother   . Hypertension Mother   . Thyroid disease Mother   . Birth defects Sister   . Migraines Sister   . Healthy Brother   . Healthy Daughter   . Healthy Sister   . Drug abuse Brother   . Healthy Brother   . Healthy Daughter     Social History   Socioeconomic History  . Marital status: Married    Spouse name: Not on file  . Number of children: Not on file  . Years of education: Not on file  . Highest education level: Not on file  Occupational History  . Not on file  Tobacco Use  . Smoking  status: Former Smoker    Packs/day: 1.00    Years: 23.00    Pack years: 23.00    Types: Cigarettes    Quit date: 1998    Years since quitting: 23.8  . Smokeless tobacco: Never Used  Vaping Use  . Vaping Use: Never used  Substance and Sexual Activity  . Alcohol use: No  . Drug use: No  . Sexual activity: Yes    Birth control/protection: None  Other Topics Concern  . Not on file  Social History Narrative  . Not on file   Social Determinants of Health   Financial Resource Strain:   . Difficulty of Paying Living Expenses: Not on file  Food Insecurity:   . Worried About Charity fundraiser in the Last Year: Not on file  . Ran Out of Food in the Last Year: Not on file  Transportation Needs:   . Lack of Transportation (Medical): Not on file  . Lack of Transportation (Non-Medical): Not on file  Physical Activity:   . Days of Exercise per Week: Not on file  . Minutes of Exercise per Session: Not on file  Stress:   . Feeling of Stress : Not on file   Social Connections:   . Frequency of Communication with Friends and Family: Not on file  . Frequency of Social Gatherings with Friends and Family: Not on file  . Attends Religious Services: Not on file  . Active Member of Clubs or Organizations: Not on file  . Attends Archivist Meetings: Not on file  . Marital Status: Not on file  Intimate Partner Violence:   . Fear of Current or Ex-Partner: Not on file  . Emotionally Abused: Not on file  . Physically Abused: Not on file  . Sexually Abused: Not on file    Review of Systems  Psychiatric/Behavioral: The patient is nervous/anxious.   All other systems reviewed and are negative.   PHYSICAL EXAMINATION:    BP 122/74   Pulse (!) 129   Ht 5' 5.5" (1.664 m)   Wt 106 lb (48.1 kg)   LMP 03/13/2020   SpO2 97%   BMI 17.37 kg/m     General appearance: alert, cooperative and appears stated age  Pelvic: External genitalia:  no lesions              Urethra:  normal appearing urethra with no masses, tenderness or lesions              Bartholins and Skenes: normal                 Vagina: normal appearing vagina with normal color and discharge, no lesions              Cervix: no lesions  Procedure: Endometrial biopsy and loop cone cervical biopsy  The patient was counseled as to the risks of the procedure, including: infection, bleeding, uterine perforation,future pregnancy risks and cervical stenosis. A consent form was signed.   Under colposcopic guidance, Lugols solution was placed on the cervix and a paracervical block was injected using 1% lidocaine with epinephrine. The lugols extended to the opening of the external os. The colposcopy was not satisfactory posteriorly.  Hibaclens was used to cleans the cervix and a non conducting tenaculum was placed at 12 o'clock. The pipelle was placed into the endometrial cavity. The uterus sounded to ~8 cm. The endometrial biopsy was performed, taking care to get a representative sample,  sampling 360 degrees of the  uterine cavity. A large amount of tissue was obtained. There were no complications.   Under colposcopic guidance, the 2 x 0.8 cm loop was used to remove a portion of the ectocervix taking care to get the entire transformation zone. 2 passes were needed A second 1 x 0.8 cm loop was used to remove a portion of the endocervix. The settings were 55 cut, 61 coag with a blend of 1.  An ECC was performed. The cautery ball was then used to cauterize the base of the biopsy site and monsels were placed. The patient tolerated the procedure well.   Chaperone was present for exam.  ASSESSMENT CIN II-III AUB Severe dysmenorrhea    PLAN Leep done Endometrial biopsy done Will schedule TLH/BS, possible treatment of endometriosis, cystoscopy.    An After Visit Summary was printed and given to the patient.

## 2020-04-09 NOTE — Patient Instructions (Signed)

## 2020-04-11 ENCOUNTER — Telehealth: Payer: Self-pay

## 2020-04-11 LAB — LIPID PANEL
Chol/HDL Ratio: 6.3 ratio — ABNORMAL HIGH (ref 0.0–4.4)
Cholesterol, Total: 226 mg/dL — ABNORMAL HIGH (ref 100–199)
HDL: 36 mg/dL — ABNORMAL LOW (ref >39)
LDL Chol Calc (NIH): 174 mg/dL — ABNORMAL HIGH (ref 0–99)
Triglycerides: 91 mg/dL (ref 0–149)
VLDL Cholesterol Cal: 16 mg/dL (ref 5–40)

## 2020-04-11 LAB — INSULIN, RANDOM: INSULIN: 19.8 u[IU]/mL (ref 2.6–24.9)

## 2020-04-11 LAB — COMPREHENSIVE METABOLIC PANEL
ALT: 7 IU/L (ref 0–32)
AST: 11 IU/L (ref 0–40)
Albumin/Globulin Ratio: 1.6 (ref 1.2–2.2)
Albumin: 4.7 g/dL (ref 3.8–4.8)
Alkaline Phosphatase: 73 IU/L (ref 44–121)
BUN/Creatinine Ratio: 17 (ref 9–23)
BUN: 14 mg/dL (ref 6–24)
Bilirubin Total: 0.6 mg/dL (ref 0.0–1.2)
CO2: 24 mmol/L (ref 20–29)
Calcium: 9.8 mg/dL (ref 8.7–10.2)
Chloride: 96 mmol/L (ref 96–106)
Creatinine, Ser: 0.84 mg/dL (ref 0.57–1.00)
GFR calc Af Amer: 101 mL/min/{1.73_m2} (ref >59)
GFR calc non Af Amer: 87 mL/min/{1.73_m2} (ref >59)
Globulin, Total: 3 g/dL (ref 1.5–4.5)
Glucose: 91 mg/dL (ref 65–99)
Potassium: 3.7 mmol/L (ref 3.5–5.2)
Sodium: 135 mmol/L (ref 134–144)
Total Protein: 7.7 g/dL (ref 6.0–8.5)

## 2020-04-11 LAB — THYROID ANTIBODIES
Thyroglobulin Antibody: <1 IU/mL (ref 0.0–0.9)
Thyroperoxidase Ab SerPl-aCnc: 9 IU/mL (ref 0–34)

## 2020-04-11 LAB — B12 AND FOLATE PANEL
Folate: 6.9 ng/mL (ref >3.0)
Vitamin B-12: 325 pg/mL (ref 232–1245)

## 2020-04-11 LAB — HIGH SENSITIVITY CRP: CRP, High Sensitivity: 0.48 mg/L (ref 0.00–3.00)

## 2020-04-11 LAB — THYROID PANEL WITH TSH
Free Thyroxine Index: 1.9 (ref 1.2–4.9)
T3 Uptake Ratio: 24 % (ref 24–39)
T4, Total: 7.9 ug/dL (ref 4.5–12.0)
TSH: 2.72 u[IU]/mL (ref 0.450–4.500)

## 2020-04-11 LAB — NMR LIPOPROF WSUBCLS+GRAPH
Cholesterol, Total: 223 mg/dL — ABNORMAL HIGH (ref 100–199)
HDL Particle Number: 20.1 umol/L — ABNORMAL LOW (ref >=30.5)
HDL Size: 9.4 nm (ref >=9.2)
HDL-C: 36 mg/dL — ABNORMAL LOW (ref >39)
LDL Particle Number: 1795 nmol/L — ABNORMAL HIGH (ref <1000)
LDL Size: 21.8 nm (ref >20.5)
LDL Size: 21.8 nm (ref >=20.8)
LDL-C (NIH Calc): 174 mg/dL — ABNORMAL HIGH (ref 0–99)
LP-IR Score: <25 (ref <=45)
Large HDL-P: 3.6 umol/L — ABNORMAL LOW (ref >=4.8)
Large VLDL-P: 1.3 nmol/L (ref <=2.7)
Small LDL Particle Number: 488 nmol/L (ref <=527)
Small LDL-P: 488 nmol/L (ref <=527)
Triglycerides: 75 mg/dL (ref 0–149)
VLDL Size: 37.7 nm (ref <=46.6)

## 2020-04-11 LAB — HEMOGLOBIN A1C
Est. average glucose Bld gHb Est-mCnc: 120 mg/dL
Hgb A1c MFr Bld: 5.8 % — ABNORMAL HIGH (ref 4.8–5.6)

## 2020-04-11 LAB — FIBRINOGEN: Fibrinogen: 379 mg/dL (ref 193–507)

## 2020-04-11 LAB — SURGICAL PATHOLOGY

## 2020-04-11 LAB — URIC ACID: Uric Acid: 3.4 mg/dL (ref 2.6–6.2)

## 2020-04-11 LAB — G-6-PD, QUANT, BLOOD AND RBC
G-6-PD, Quant: 275 U/10E12 RBC (ref 127–427)
RBC: 5.03 x10E6/uL (ref 3.77–5.28)

## 2020-04-11 LAB — APOLIPOPROTEIN A-1: Apolipoprotein A-1: 95 mg/dL — ABNORMAL LOW (ref 116–209)

## 2020-04-11 LAB — VITAMIN D 25 HYDROXY (VIT D DEFICIENCY, FRACTURES): Vit D, 25-Hydroxy: 19 ng/mL — ABNORMAL LOW (ref 30.0–100.0)

## 2020-04-11 LAB — T3, REVERSE: Reverse T3, Serum: 19.9 ng/dL (ref 9.2–24.1)

## 2020-04-11 LAB — HOMOCYSTEINE: Homocysteine: 15.9 umol/L — ABNORMAL HIGH (ref 0.0–14.5)

## 2020-04-11 LAB — GAMMA GT: GGT: 7 IU/L (ref 0–60)

## 2020-04-11 LAB — APOLIPOPROTEIN B: Apolipoprotein B: 126 mg/dL — ABNORMAL HIGH (ref <90)

## 2020-04-11 NOTE — Telephone Encounter (Signed)
-----   Message from Salvadore Dom, MD sent at 04/11/2020  4:43 PM EDT ----- Please inform the patient that her specimens all returned with CIN II-III, + margins. Endometrial biopsy is negative.  Please schedule her TLH for 6 weeks from now.

## 2020-04-11 NOTE — Telephone Encounter (Signed)
Spoke with patient. Advised of results as seen below from [place]. Patient verbalizes understanding and would like to proceed with TLH. Advised she will be contacted to discuss benefits and then we will discuss surgery dates. Patient is agreeable.  Routing to [employer] for benefits.

## 2020-04-12 ENCOUNTER — Telehealth: Payer: Self-pay

## 2020-04-12 NOTE — Telephone Encounter (Signed)
Patient is returning call.  °

## 2020-04-12 NOTE — Telephone Encounter (Signed)
Spoke with patient regarding surgery benefits. Patient acknowledges understanding of information presented. Patient is aware that benefits presented are for professional benefits only. Patient is aware that once surgery is scheduled, the hospital will call with separate benefits. Patient is aware of surgery cancellation policy.  Patient is ready to proceed with surgery scheduling and has requested to be scheduled for 05/28/2020. Informed patient that surgery would be posted and a return call from [place], [place], once date is confirmed.  Patient is agreeable.

## 2020-04-12 NOTE — Telephone Encounter (Signed)
I agree

## 2020-04-12 NOTE — Telephone Encounter (Signed)
Spoke with patient regarding increased bleeding after procedure. Patient stated that yesterday around lunchtime she began experiencing an increase in bleeding. Patient stated that it is bright red, with some dark spots. Patient is currently changing pad every 4 hours and is questioning at what point she should be concerned.

## 2020-04-12 NOTE — Telephone Encounter (Signed)
Spoke with patient. S/p LEEP and EMB on 04/09/20.  Patient reports vaginal bleeding after LEEP procedure. Blood is bright at times and dark red with black spots. Changing overnight pad that is not saturated q4 hours. Reports intermittent sharp stabbing pain in vagina, last approximately 15 seconds and resolves. Otherwise she has intermittent cramping 7/10. She has been taking Aleve 220 mg q12 hours, no change in pain. Denies odor, N/V, fever/chills or foul odor. LMP 03/13/20.   Advised patient this could be the start of her menses. Advised to continue to monitor bleeding, return call to office if bleeding becomes heavy, changing a saturated pad q1-2 hours or if any new symptoms develop. Heating pad for comfort, also recommended trying OTC tylenol, can alternate with Aleve. Advised may notice some dark, black d/c for several days, this is because of the solution used during the LEEP procedure to stop bleeding.   Advised patient I will update Dr. Talbert Nan and return call if any additional recommendations. Instructions provided to patient for on-call provider since it is Friday, she is aware to call with any concerns.   Routing to Dr. Talbert Nan for final review.

## 2020-04-12 NOTE — Telephone Encounter (Signed)
Encounter closed

## 2020-04-12 NOTE — Telephone Encounter (Signed)
Left message to call Keondre Markson at [phone]. 

## 2020-04-12 NOTE — Telephone Encounter (Signed)
Spoke with patient. Surgery scheduled for 05/28/2020 at 0730 at [facility]. Pre op scheduled for 05/07/2020 at 2:30 pm with Dr. Talbert Nan. COVID test scheduled for 05/24/2020 at 10 am at [facility] location. Patient is aware of the need to quarantine after test until surgery. 1 week post op scheduled for 06/04/2020 at 4 pm with Dr.Jertson. 4 week post op scheduled for 06/26/2019 at 4 pm with Dr.Jertson. Surgery instructions reviewed. Patient verbalizes understanding. To be given at her pre op appointment.  Routing to provider and will close encounter.

## 2020-04-16 ENCOUNTER — Encounter: Payer: Self-pay | Admitting: Physician Assistant

## 2020-04-16 ENCOUNTER — Other Ambulatory Visit: Payer: Self-pay

## 2020-04-16 ENCOUNTER — Other Ambulatory Visit: Payer: 59

## 2020-04-16 ENCOUNTER — Ambulatory Visit (INDEPENDENT_AMBULATORY_CARE_PROVIDER_SITE_OTHER): Payer: 59 | Admitting: Physician Assistant

## 2020-04-16 VITALS — BP 114/77 | HR 110 | Temp 97.7°F | Ht 65.5 in | Wt 104.9 lb

## 2020-04-16 DIAGNOSIS — Z23 Encounter for immunization: Secondary | ICD-10-CM | POA: Diagnosis not present

## 2020-04-16 DIAGNOSIS — G894 Chronic pain syndrome: Secondary | ICD-10-CM

## 2020-04-16 DIAGNOSIS — N939 Abnormal uterine and vaginal bleeding, unspecified: Secondary | ICD-10-CM

## 2020-04-16 DIAGNOSIS — Z0000 Encounter for general adult medical examination without abnormal findings: Secondary | ICD-10-CM

## 2020-04-16 DIAGNOSIS — G4700 Insomnia, unspecified: Secondary | ICD-10-CM | POA: Diagnosis not present

## 2020-04-16 DIAGNOSIS — Z720 Tobacco use: Secondary | ICD-10-CM

## 2020-04-16 DIAGNOSIS — D069 Carcinoma in situ of cervix, unspecified: Secondary | ICD-10-CM | POA: Diagnosis not present

## 2020-04-16 DIAGNOSIS — F32A Depression, unspecified: Secondary | ICD-10-CM

## 2020-04-16 DIAGNOSIS — Z716 Tobacco abuse counseling: Secondary | ICD-10-CM

## 2020-04-16 DIAGNOSIS — F419 Anxiety disorder, unspecified: Secondary | ICD-10-CM

## 2020-04-16 MED ORDER — HYDROXYZINE HCL 25 MG PO TABS: ORAL_TABLET | ORAL | 0 refills | Status: DC

## 2020-04-16 MED ORDER — CHANTIX STARTING MONTH PAK 0.5 MG X 11 & 1 MG X 42 PO TABS: ORAL_TABLET | ORAL | 0 refills | Status: DC

## 2020-04-16 MED ORDER — TRAZODONE HCL 50 MG PO TABS: 25.0000 mg | ORAL_TABLET | Freq: Every evening | ORAL | 2 refills | Status: DC | PRN

## 2020-04-16 MED ORDER — FLUOXETINE HCL 20 MG PO TABS: ORAL_TABLET | ORAL | 0 refills | Status: DC

## 2020-04-16 NOTE — Progress Notes (Signed)
Female Physical   Impression and Recommendations:    1. Healthcare maintenance   2. Need for influenza vaccination   3. Insomnia, unspecified type   4. Tobacco use   5. Anxiety and depression   6. Encounter for tobacco use cessation counseling   7. CIN III (cervical intraepithelial neoplasia grade III) with severe dysplasia   8. Abnormal uterine bleeding   9. Chronic pain syndrome      1) Anticipatory Guidance: Discussed skin CA prevention and sunscreen when outside along with skin surveillance; eating a balanced and modest diet; physical activity at least 25 minutes per day or minimum of 150 min/ week moderate to intense activity.  2) Immunizations / Screenings / Labs:   All immunizations are up-to-date per recommendations or will be updated today if pt allows.    - Patient understands with dental and vision screens they will schedule independently.  - UTD on Pap smear, Tdap, hep C and HIV screenings. - Patient obtaining various lab tests per pain management and is aware to return for additional labs when [facility] phlebotomist is in the office. - Agreeable to flu vaccine today. -Plans to obtain mammogram through Ob-Gyn.  3) Weight: Discussed goal to improve diet habits to improve overall feelings of well being and objective health data. Improve nutrient density of diet through increasing intake of fruits and vegetables and decreasing saturated fats, white flour products and refined sugars.  4) Healthcare maintenance: -Continue to follow-up with OB/GYN and pain management. -Follow a heart healthy diet and monitor carbohydrates and glucose. -Stay well-hydrated. -Discussed with patient various management options for anxiety and insomnia including medications and side effects.  Advised to let me know if unable to tolerate medications. Will start Prozac and hydroxyzine prn. Patient interested on Chantix for smoking cessation and discussed potential side effects including abnormal  dreams and worsening mood. Advised to stop medication if starts experiencing any suicidal ideations. Should avoid alcohol (reports no use). -Will start Trazodone to help with sleep and recommend to establish a good sleep hygiene. -Follow-up in 3 months for mood management, insomnia, and tobacco use (okay for telemedicine appointment).   Meds ordered this encounter  Medications   varenicline (CHANTIX STARTING MONTH PAK) 0.5 MG X 11 & 1 MG X 42 tablet    Sig: Take one 0.5 mg tablet by mouth once daily for 3 days, then increase to one 0.5 mg tablet twice daily for 4 days, then increase to one 1 mg tablet twice daily.    Dispense:  53 tablet    Refill:  0    Order Specific Question:   Supervising Provider    Answer:   Beatrice Lecher D [[id number]]   traZODone (DESYREL) 50 MG tablet    Sig: Take 0.5-1 tablets (25-50 mg total) by mouth at bedtime as needed for sleep.    Dispense:  30 tablet    Refill:  2    Order Specific Question:   Supervising Provider    Answer:   Beatrice Lecher D [[id number]]   FLUoxetine (PROZAC) 20 MG tablet    Sig: Take 0.5 (10mg ) tablet once daily for 7 days. Then take 1 tablet once daily.    Dispense:  90 tablet    Refill:  0    Needs OV for further refills.    Order Specific Question:   Supervising Provider    Answer:   Beatrice Lecher D [[id number]]   hydrOXYzine (ATARAX/VISTARIL) 25 MG tablet    Sig: Take 1-2 tablets by mouth  every 8 hours as needed for severe anxiety.    Dispense:  60 tablet    Refill:  0    Order Specific Question:   Supervising Provider    Answer:   Beatrice Lecher D [[id number]]    Orders Placed This Encounter  Procedures   Flu Vaccine QUAD 36+ mos IM     Return in about 3 months (around 07/17/2020) for Mood- started new med, Isomnia- started med, Tobacco use.      Gross side effects, risk and benefits, and alternatives of medications discussed with patient.  Patient is aware that all medications have potential side effects and we  are unable to predict every side effect or drug-drug interaction that may occur.  Expresses verbal understanding and consents to current therapy plan and treatment regimen.  F-up preventative CPE in 1 year- this is in addition to any chronic care visits.    Please see orders placed and AVS handed out to patient at the end of our visit for further patient instructions/ counseling done pertaining to today's office visit.  Note:  This note was prepared with assistance of Dragon voice recognition software. Occasional wrong-word or sound-a-like substitutions may have occurred due to the inherent limitations of voice recognition software.    Subjective:     CPE HPI: Rachel Bennett is a 39 y.o. female who presents to [facility] today for a yearly health maintenance exam.   Health Maintenance Summary  - Reviewed and updated, unless pt declines services.   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    Immunization History  Administered Date(s) Administered   HPV 9-valent 01/15/2020, 03/19/2020   Influenza,inj,Quad PF,6+ Mos 05/19/2017, 04/16/2020   Tdap 05/19/2017     Health Maintenance  Topic Date Due   COVID-19 Vaccine (1) 05/02/2020 (Originally 03/21/1993)   PAP SMEAR-Modifier  01/16/2023   TETANUS/TDAP  05/20/2027    INFLUENZA VACCINE  Completed   Hepatitis C Screening  Completed   HIV Screening  Completed     Wt Readings from Last 3 Encounters:  04/16/20 104 lb 14.4 oz (47.6 kg)  04/09/20 106 lb (48.1 kg)  03/19/20 107 lb (48.5 kg)   BP Readings from Last 3 Encounters:  04/16/20 114/77  04/09/20 122/74  03/19/20 102/60   Pulse Readings from Last 3 Encounters:  04/16/20 (!) 110  04/09/20 (!) 129  03/19/20 63     Past Medical History:  Diagnosis Date   Anemia    Anxiety    Chronic pain    back, hips and pelvis   Depression    Dysmenorrhea    Endometriosis    Ovarian cyst       Past Surgical History:  Procedure Laterality Date   LEEP     LUMBAR DISC SURGERY     NASAL SEPTUM SURGERY  Family History  Problem Relation Age of Onset   Arthritis Mother    Anxiety disorder Mother    Depression Mother    Diabetes Mother    Heart disease Mother    Hyperlipidemia Mother    Hypertension Mother    Thyroid disease Mother    Birth defects Sister    Migraines Sister    Healthy Brother    Healthy Daughter    Healthy Sister    Drug abuse Brother    Healthy Brother    Healthy Daughter       Social History   Substance and Sexual Activity  Drug Use No  ,   Social History   Substance and Sexual Activity  Alcohol Use No  ,   Social History   Tobacco Use  Smoking Status Former Smoker   Packs/day: 1.00   Years: 23.00   Pack years: 23.00   Types: Cigarettes   Quit date: 1998   Years since quitting: 23.8  Smokeless Tobacco Never Used  ,   Social History   Substance and Sexual Activity  Sexual Activity Yes   Birth control/protection: None    Current Outpatient Medications on File Prior to Visit  Medication Sig Dispense Refill   Buprenorphine HCl-Naloxone HCl 4-1 MG FILM SMARTSIG:1 Strip(s) Sublingual Every 6 Hours     LORazepam (ATIVAN) 1 MG tablet Take one tablet one hour prior to the procedure. 1 tablet 0    ondansetron (ZOFRAN) 4 MG tablet Take 4 mg by mouth as needed.     tiZANidine (ZANAFLEX) 4 MG tablet Take 4 mg by mouth every 8 (eight) hours.     No current facility-administered medications on file prior to visit.    Allergies: Sulfa antibiotics  Review of Systems: General:   Denies fever, chills, unexplained weight loss.  Optho/Auditory:   Denies visual changes, blurred vision/LOV Respiratory:   Denies SOB, DOE more than baseline levels.   Cardiovascular:   Denies chest pain, palpitations, new onset peripheral edema  Gastrointestinal:   Denies nausea, vomiting, diarrhea.  Genitourinary: Denies dysuria, urgency, flank pain   Endocrine:     Denies hot or cold intolerance, polyuria, polydipsia. Musculoskeletal:   Denies unexplained myalgias, joint swelling, unexplained arthralgias, gait problems.  Skin:  Denies rash, suspicious lesions Neurological:     Denies dizziness, unexplained weakness, numbness  Psychiatric/Behavioral:   Denies suicidal or homicidal ideations, hallucinations, delusions    Objective:    Blood pressure 114/77, pulse (!) 110, temperature 97.7 F (36.5 C), temperature source Oral, height 5' 5.5" (1.664 m), weight 104 lb 14.4 oz (47.6 kg), SpO2 100 %. Body mass index is 17.19 kg/m. General Appearance:    Alert, cooperative, no distress, appears stated age  Head:    Normocephalic, without obvious abnormality, atraumatic  Eyes:    PERRL, conjunctiva/corneas clear, EOM's intact, both eyes  Ears:    Normal TM's and external ear canals, both ears  Nose:   Nares normal, septum midline, mucosa normal, no drainage    or sinus tenderness  Throat:   Lips w/o lesion, mucosa moist, and tongue normal; teeth and   gums poor condition  Neck:   Supple, symmetrical, trachea midline, no adenopathy;    thyroid:  no enlargement/tenderness/nodules  Back:     Symmetric, no curvature, ROM normal, no CVA tenderness  Lungs:     Clear to auscultation bilaterally, respirations  unlabored, no       Wh/ R/ R  Chest Wall:    No tenderness  or gross deformity; normal excursion   Heart:    Regular rate and rhythm, S1 and S2 normal, no murmur  Breast Exam:    Deferred to Ob-Gyn.  Abdomen:     Soft, tenderness (on menses), bowel sounds active all four quadrants, No   G/R/R, no masses, no organomegaly  Genitalia:   Deferred to OB-Gyn.  Rectal:   Deferred to Ob-Gyn.  Extremities:   Extremities normal, atraumatic, no cyanosis or gross edema  Pulses:   2+ and symmetric all extremities  Skin:   Warm, dry, Skin color, texture, turgor normal, no obvious rashes or lesions Psych: No HI/SI, judgement and insight good, anxious/depressed mood. Flat Affect.  Neurologic:   CNII-XII grossly intact, normal strength, sensation and reflexes throughout

## 2020-04-16 NOTE — Patient Instructions (Signed)
Preventive Care 40-39 Years Old, Female Preventive care refers to visits with your health care provider and lifestyle choices that can promote health and wellness. This includes:  A yearly physical exam. This may also be called an annual well check.  Regular dental visits and eye exams.  Immunizations.  Screening for certain conditions.  Healthy lifestyle choices, such as eating a healthy diet, getting regular exercise, not using drugs or products that contain nicotine and tobacco, and limiting alcohol use. What can I expect for my preventive care visit? Physical exam Your health care provider will check your:  Height and weight. This may be used to calculate body mass index (BMI), which tells if you are at a healthy weight.  Heart rate and blood pressure.  Skin for abnormal spots. Counseling Your health care provider may ask you questions about your:  Alcohol, tobacco, and drug use.  Emotional well-being.  Home and relationship well-being.  Sexual activity.  Eating habits.  Work and work environment.  Method of birth control.  Menstrual cycle.  Pregnancy history. What immunizations do I need?  Influenza (flu) vaccine  This is recommended every year. Tetanus, diphtheria, and pertussis (Tdap) vaccine  You may need a Td booster every 40 years. Varicella (chickenpox) vaccine  You may need this if you have not been vaccinated. Human papillomavirus (HPV) vaccine  If recommended by your health care provider, you may need three doses over 6 months. Measles, mumps, and rubella (MMR) vaccine  You may need at least one dose of MMR. You may also need a second dose. Meningococcal conjugate (MenACWY) vaccine  One dose is recommended if you are age 19-21 years and a first-year college student living in a residence hall, or if you have one of several medical conditions. You may also need additional booster doses. Pneumococcal conjugate (PCV13) vaccine  You may need  this if you have certain conditions and were not previously vaccinated. Pneumococcal polysaccharide (PPSV23) vaccine  You may need one or two doses if you smoke cigarettes or if you have certain conditions. Hepatitis A vaccine  You may need this if you have certain conditions or if you travel or work in places where you may be exposed to hepatitis A. Hepatitis B vaccine  You may need this if you have certain conditions or if you travel or work in places where you may be exposed to hepatitis B. Haemophilus influenzae type b (Hib) vaccine  You may need this if you have certain conditions. You may receive vaccines as individual doses or as more than one vaccine together in one shot (combination vaccines). Talk with your health care provider about the risks and benefits of combination vaccines. What tests do I need?  Blood tests  Lipid and cholesterol levels. These may be checked every 5 years starting at age 40.  Hepatitis C test.  Hepatitis B test. Screening  Diabetes screening. This is done by checking your blood sugar (glucose) after you have not eaten for a while (fasting).  Sexually transmitted disease (STD) testing.  BRCA-related cancer screening. This may be done if you have a family history of breast, ovarian, tubal, or peritoneal cancers.  Pelvic exam and Pap test. This may be done every 3 years starting at age 40. Starting at age 40, this may be done every 5 years if you have a Pap test in combination with an HPV test. Talk with your health care provider about your test results, treatment options, and if necessary, the need for more tests.   Follow these instructions at home: Eating and drinking   Eat a diet that includes fresh fruits and vegetables, whole grains, lean protein, and low-fat dairy.  Take vitamin and mineral supplements as recommended by your health care provider.  Do not drink alcohol if: ? Your health care provider tells you not to drink. ? You are  pregnant, may be pregnant, or are planning to become pregnant.  If you drink alcohol: ? Limit how much you have to 0-1 drink a day. ? Be aware of how much alcohol is in your drink. In the [place] one drink equals one 12 oz bottle of beer (355 mL), one 5 oz glass of wine (148 mL), or one 1 oz glass of hard liquor (44 mL). Lifestyle  Take daily care of your teeth and gums.  Stay active. Exercise for at least 30 minutes on 5 or more days each week.  Do not use any products that contain nicotine or tobacco, such as cigarettes, e-cigarettes, and chewing tobacco. If you need help quitting, ask your health care provider.  If you are sexually active, practice safe sex. Use a condom or other form of birth control (contraception) in order to prevent pregnancy and STIs (sexually transmitted infections). If you plan to become pregnant, see your health care provider for a preconception visit. What's next?  Visit your health care provider once a year for a well check visit.  Ask your health care provider how often you should have your eyes and teeth checked.  Stay up to date on all vaccines. This information is not intended to replace advice given to you by your health care provider. Make sure you discuss any questions you have with your health care provider. Document Revised: 02/17/2018 Document Reviewed: 02/17/2018 Elsevier Patient Education  2020 [employer].

## 2020-04-17 ENCOUNTER — Other Ambulatory Visit: Payer: 59

## 2020-04-17 ENCOUNTER — Telehealth: Payer: Self-pay

## 2020-04-17 NOTE — Telephone Encounter (Signed)
Patient is calling with questions about pre op blood work.

## 2020-04-18 ENCOUNTER — Other Ambulatory Visit: Payer: Self-pay

## 2020-04-18 ENCOUNTER — Other Ambulatory Visit: Payer: 59

## 2020-04-18 DIAGNOSIS — E618 Deficiency of other specified nutrient elements: Secondary | ICD-10-CM

## 2020-04-18 DIAGNOSIS — G894 Chronic pain syndrome: Secondary | ICD-10-CM

## 2020-04-18 DIAGNOSIS — E531 Pyridoxine deficiency: Secondary | ICD-10-CM

## 2020-04-18 DIAGNOSIS — M2550 Pain in unspecified joint: Secondary | ICD-10-CM

## 2020-04-18 DIAGNOSIS — E538 Deficiency of other specified B group vitamins: Secondary | ICD-10-CM

## 2020-04-18 NOTE — Telephone Encounter (Signed)
Attempted to reach patient x 2. Message states that the number you have reached has a calling restriction that will not allow completion of the call.

## 2020-04-22 ENCOUNTER — Other Ambulatory Visit: Payer: 59

## 2020-04-22 ENCOUNTER — Other Ambulatory Visit: Payer: Self-pay

## 2020-04-22 DIAGNOSIS — E618 Deficiency of other specified nutrient elements: Secondary | ICD-10-CM

## 2020-04-22 DIAGNOSIS — Z0000 Encounter for general adult medical examination without abnormal findings: Secondary | ICD-10-CM

## 2020-04-22 DIAGNOSIS — E531 Pyridoxine deficiency: Secondary | ICD-10-CM

## 2020-04-22 DIAGNOSIS — M2550 Pain in unspecified joint: Secondary | ICD-10-CM

## 2020-04-22 DIAGNOSIS — E538 Deficiency of other specified B group vitamins: Secondary | ICD-10-CM

## 2020-04-22 DIAGNOSIS — G894 Chronic pain syndrome: Secondary | ICD-10-CM

## 2020-04-22 NOTE — Telephone Encounter (Signed)
Spoke with patient. Patient wants to verify pre op lab appointment with [facility]. Advised this is on 05/23/2020 at 10 am. Patient is agreeable to date and time. Encounter closed.

## 2020-04-30 LAB — POTASSIUM: Potassium: 3.4 mmol/L — ABNORMAL LOW (ref 3.5–5.2)

## 2020-04-30 LAB — HPA-1A (PLA1 PLT AG) GENOTYP

## 2020-04-30 LAB — CBC WITH DIFFERENTIAL/PLATELET
Basophils Absolute: 0.1 10*3/uL (ref 0.0–0.2)
Basos: 1 %
EOS (ABSOLUTE): 0.1 10*3/uL (ref 0.0–0.4)
Eos: 2 %
Hematocrit: 37.4 % (ref 34.0–46.6)
Hemoglobin: 11.6 g/dL (ref 11.1–15.9)
Immature Grans (Abs): 0 10*3/uL (ref 0.0–0.1)
Immature Granulocytes: 1 %
Lymphocytes Absolute: 1.4 10*3/uL (ref 0.7–3.1)
Lymphs: 24 %
MCH: 25.5 pg — ABNORMAL LOW (ref 26.6–33.0)
MCHC: 31 g/dL — ABNORMAL LOW (ref 31.5–35.7)
MCV: 82 fL (ref 79–97)
Monocytes Absolute: 0.5 10*3/uL (ref 0.1–0.9)
Monocytes: 8 %
Neutrophils Absolute: 3.8 10*3/uL (ref 1.4–7.0)
Neutrophils: 64 %
Platelets: 313 10*3/uL (ref 150–450)
RBC: 4.55 x10E6/uL (ref 3.77–5.28)
RDW: 16.8 % — ABNORMAL HIGH (ref 11.7–15.4)
WBC: 5.9 10*3/uL (ref 3.4–10.8)

## 2020-04-30 LAB — TESTOSTERONE: Testosterone: 15 ng/dL (ref 8–60)

## 2020-04-30 LAB — FERRITIN: Ferritin: 12 ng/mL — ABNORMAL LOW (ref 15–150)

## 2020-04-30 LAB — ANTI-RO/NEG ANA: Anti-Ro (SS-A) Ab (RDL): <20 Units (ref <20)

## 2020-04-30 LAB — ANA PROFILE 11(RDL): Anti-Nuclear Ab by IFA (RDL): NEGATIVE

## 2020-04-30 LAB — HEAVY METALS, BLOOD
Arsenic: 1 ug/L — ABNORMAL LOW (ref 2–23)
Lead, Blood: <1 ug/dL (ref 0–4)
Mercury: <1 ug/L (ref 0.0–14.9)

## 2020-04-30 LAB — CHROMIUM AND COBALT, WB (MOM)
Chromium: <1 ng/mL (ref <3.0)
Cobalt: <1 ng/mL (ref <3.0)

## 2020-04-30 LAB — CORTISOL: Cortisol: 11.9 ug/dL

## 2020-04-30 LAB — SODIUM: Sodium: 140 mmol/L (ref 134–144)

## 2020-04-30 LAB — TESTOSTERONE, FREE: Testosterone, Free: 1 pg/mL (ref 0.0–4.2)

## 2020-04-30 LAB — THALLIUM: Thallium: <1 ng/ml (ref <5.0)

## 2020-04-30 LAB — TITANIUM, WB: Titanium, WB: <10 ng/mL (ref <10.0)

## 2020-04-30 LAB — INSULIN-LIKE GROWTH FACTOR: Insulin-Like GF-1: 87 ng/mL (ref 79–259)

## 2020-04-30 LAB — ESTRADIOL: Estradiol: 65.8 pg/mL

## 2020-04-30 LAB — NICKEL, PLASMA: Nickel, Plasma: 3.7 ug/L (ref 0.6–7.5)

## 2020-04-30 LAB — PROGESTERONE: Progesterone: 0.3 ng/mL

## 2020-04-30 LAB — CALCIUM: Calcium: 9.2 mg/dL (ref 8.7–10.2)

## 2020-04-30 LAB — ZINC: Zinc: 72 ug/dL (ref 44–115)

## 2020-04-30 LAB — MAGNESIUM: Magnesium: 2 mg/dL (ref 1.6–2.3)

## 2020-04-30 LAB — CERULOPLASMIN: Ceruloplasmin: 29.2 mg/dL (ref 19.0–39.0)

## 2020-04-30 LAB — SELENIUM SERUM: Selenium, S/P: 105 ug/L (ref 93–198)

## 2020-04-30 LAB — LITHIUM LEVEL: Lithium Lvl: <0.1 mmol/L — ABNORMAL LOW (ref 0.5–1.2)

## 2020-04-30 LAB — PHOSPHORUS: Phosphorus: 3.8 mg/dL (ref 3.0–4.3)

## 2020-04-30 LAB — PREGNENOLONE: Pregnenolone: 25 ng/dL

## 2020-04-30 LAB — DHEA-SULFATE: DHEA-SO4: 64.9 ug/dL (ref 57.3–279.2)

## 2020-05-04 LAB — THALLIUM: Thallium: <1 ng/ml (ref <5.0)

## 2020-05-04 LAB — IRON: Iron: 27 ug/dL (ref 27–159)

## 2020-05-04 LAB — LEAD, BLOOD (ADULT >= 16 YRS): Lead-Whole Blood: <1 ug/dL (ref 0–4)

## 2020-05-04 LAB — HEAVY METALS, BLOOD
Arsenic: <1 ug/L — ABNORMAL LOW (ref 2–23)
Lead, Blood: <1 ug/dL (ref 0–4)
Mercury: <1 ug/L (ref 0.0–14.9)

## 2020-05-04 LAB — CALCIUM: Calcium: 9.6 mg/dL (ref 8.7–10.2)

## 2020-05-04 LAB — CERULOPLASMIN: Ceruloplasmin: 29.2 mg/dL (ref 19.0–39.0)

## 2020-05-07 ENCOUNTER — Ambulatory Visit (INDEPENDENT_AMBULATORY_CARE_PROVIDER_SITE_OTHER): Payer: 59 | Admitting: Obstetrics and Gynecology

## 2020-05-07 ENCOUNTER — Encounter: Payer: Self-pay | Admitting: Obstetrics and Gynecology

## 2020-05-07 ENCOUNTER — Other Ambulatory Visit: Payer: Self-pay

## 2020-05-07 DIAGNOSIS — R7303 Prediabetes: Secondary | ICD-10-CM

## 2020-05-07 DIAGNOSIS — E785 Hyperlipidemia, unspecified: Secondary | ICD-10-CM

## 2020-05-07 NOTE — H&P (View-Only) (Signed)
GYNECOLOGY  VISIT   HPI: 39 y.o.   Married White or Caucasian Not Hispanic or Latino  female   [phone] with Patient's last menstrual period was 05/06/2020.   here for pre op appointment.     The patient has a h/o AUB, severe dysmenorrhea and cervical dysplasia. She desires definitive surgery. 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 Endometrial biopsy with secretory endometrium.   She has a h/o chronic pain, managed by Dr Angie Fava. She is on suboxone, zofran, muscle relaxant.   She has had significant nausea for the last 3 months. She has lost 12 lbs since the end of July.   GYNECOLOGIC HISTORY: Patient's last menstrual period was 05/06/2020. Contraception: w/d  Menopausal hormone therapy: none         OB History    Gravida  2   Para  2   Term  2   Preterm      AB      Living  2     SAB      TAB      Ectopic      Multiple      Live Births  2              Patient Active Problem List   Diagnosis Date Noted  . 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 Encounter for insertion of mirena IUD 05/19/2017  . Atypical nevi 05/19/2017  . Screening examination for STD (sexually transmitted disease) 05/19/2017  . Tobacco use 05/19/2017  . High serum low density lipoprotein (LDL) cholesterol 05/19/2017  . Healthcare maintenance 05/19/2017  . Vitamin D deficiency 05/19/2017  . Iron deficiency anemia 05/18/2017  . Chronic bilateral low back pain 03/05/2017  . Dysphagia, idiopathic 03/05/2017  . Opiate withdrawal ([place]) 03/04/2017    Past Medical History:  Diagnosis Date  . Anemia   . Anxiety   . Chronic pain    back, hips and pelvis  . Depression   . Dysmenorrhea   . Endometriosis   . Ovarian cyst     Past Surgical History:  Procedure Laterality Date  . LEEP    . LUMBAR DISC SURGERY    . NASAL SEPTUM SURGERY      Current Outpatient Medications  Medication Sig Dispense Refill  . Buprenorphine HCl-Naloxone HCl 4-1 MG FILM SMARTSIG:1 Strip(s)  Sublingual Every 6 Hours    . FLUoxetine (PROZAC) 20 MG tablet Take 0.5 (10mg ) tablet once daily for 7 days. Then take 1 tablet once daily. 90 tablet 0  . hydrOXYzine (ATARAX/VISTARIL) 25 MG tablet Take 1-2 tablets by mouth every 8 hours as needed for severe anxiety. 60 tablet 0  . ondansetron (ZOFRAN) 4 MG tablet Take 4 mg by mouth as needed.    Marland Kitchen tiZANidine (ZANAFLEX) 4 MG tablet Take 4 mg by mouth every 8 (eight) hours.    . traZODone (DESYREL) 50 MG tablet Take 0.5-1 tablets (25-50 mg total) by mouth at bedtime as needed for sleep. 30 tablet 2  . varenicline (CHANTIX STARTING MONTH PAK) 0.5 MG X 11 & 1 MG X 42 tablet Take one 0.5 mg tablet by mouth once daily for 3 days, then increase to one 0.5 mg tablet twice daily for 4 days, then increase to one 1 mg tablet twice daily. 53 tablet 0   No current facility-administered medications for this visit.  Hasn't started the Chantix yet   ALLERGIES: Sulfa antibiotics  Family History  Problem Relation Age of Onset  .  Arthritis Mother   . Anxiety disorder Mother   . Depression Mother   . Diabetes Mother   . Heart disease Mother   . Hyperlipidemia Mother   . Hypertension Mother   . Thyroid disease Mother   . Birth defects Sister   . Migraines Sister   . Healthy Brother   . Healthy Daughter   . Healthy Sister   . Drug abuse Brother   . Healthy Brother   . Healthy Daughter     Social History   Socioeconomic History  . Marital status: Married    Spouse name: Not on file  . Number of children: Not on file  . Years of education: Not on file  . Highest education level: Not on file  Occupational History  . Not on file  Tobacco Use  . Smoking status: Former Smoker    Packs/day: 1.00    Years: 23.00    Pack years: 23.00    Types: Cigarettes    Quit date: 1998    Years since quitting: 23.8  . Smokeless tobacco: Never Used  Vaping Use  . Vaping Use: Never used  Substance and Sexual Activity  . Alcohol use: No  . Drug use: No  .  Sexual activity: Yes    Birth control/protection: None  Other Topics Concern  . Not on file  Social History Narrative  . Not on file   Social Determinants of Health   Financial Resource Strain:   . Difficulty of Paying Living Expenses: Not on file  Food Insecurity:   . Worried About Charity fundraiser in the Last Year: Not on file  . Ran Out of Food in the Last Year: Not on file  Transportation Needs:   . Lack of Transportation (Medical): Not on file  . Lack of Transportation (Non-Medical): Not on file  Physical Activity:   . Days of Exercise per Week: Not on file  . Minutes of Exercise per Session: Not on file  Stress:   . Feeling of Stress : Not on file  Social Connections:   . Frequency of Communication with Friends and Family: Not on file  . Frequency of Social Gatherings with Friends and Family: Not on file  . Attends Religious Services: Not on file  . Active Member of Clubs or Organizations: Not on file  . Attends Archivist Meetings: Not on file  . Marital Status: Not on file  Intimate Partner Violence:   . Fear of Current or Ex-Partner: Not on file  . Emotionally Abused: Not on file  . Physically Abused: Not on file  . Sexually Abused: Not on file  Smoking 1-1.5 PPD No ETOH.   Review of Systems  All other systems reviewed and are negative.   PHYSICAL EXAMINATION:    BP 110/72   Pulse 99   Ht 5' 5.5" (1.664 m)   Wt 102 lb (46.3 kg)   LMP 05/06/2020 Comment: spotting   SpO2 100%   BMI 16.72 kg/m     General appearance: alert, cooperative and appears stated age Neck: no adenopathy, supple, symmetrical, trachea midline and thyroid normal to inspection and palpation Heart: regular rate and rhythm Lungs: CTAB Abdomen: soft, non-tender; bowel sounds normal; no masses,  no organomegaly Extremities: normal, atraumatic, no cyanosis Skin: normal color, texture and turgor, no rashes or lesions Lymph: normal cervical supraclavicular and inguinal  nodes Neurologic: grossly normal   ASSESSMENT H/O AUB and severe dysmenorrhea H/O LEEP for CIN III with + ECC  H/O opioid dependence on Suboxone Low BMI of 16, she has had a 12 lb weight loss in the last several months secondary to nausea.     PLAN Discussed total laparoscopic hysterectomy, bilateral salpingectomies, cystoscopy, possible treatment of endometriosis. Reviewed the risks of the procedure, including infection, bleeding, damage to bowel/badder/vessels/ureters.  Discussed the possible need for laparotomy. Discussed post operative recovery and risk of cuff dehiscence. All of her questions were answered Will discuss pain management with her pain provider. Will have her f/u with her primary for medical clearance

## 2020-05-07 NOTE — Progress Notes (Signed)
GYNECOLOGY  VISIT   HPI: 39 y.o.   Married White or Caucasian Not Hispanic or Latino  female   [phone] with Patient's last menstrual period was 05/06/2020.   here for pre op appointment.     The patient has a h/o AUB, severe dysmenorrhea and cervical dysplasia. She desires definitive surgery. 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 Endometrial biopsy with secretory endometrium.   She has a h/o chronic pain, managed by Dr Angie Fava. She is on suboxone, zofran, muscle relaxant.   She has had significant nausea for the last 3 months. She has lost 12 lbs since the end of July.   GYNECOLOGIC HISTORY: Patient's last menstrual period was 05/06/2020. Contraception: w/d  Menopausal hormone therapy: none         OB History    Gravida  2   Para  2   Term  2   Preterm      AB      Living  2     SAB      TAB      Ectopic      Multiple      Live Births  2              Patient Active Problem List   Diagnosis Date Noted  . 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 Encounter for insertion of mirena IUD 05/19/2017  . Atypical nevi 05/19/2017  . Screening examination for STD (sexually transmitted disease) 05/19/2017  . Tobacco use 05/19/2017  . High serum low density lipoprotein (LDL) cholesterol 05/19/2017  . Healthcare maintenance 05/19/2017  . Vitamin D deficiency 05/19/2017  . Iron deficiency anemia 05/18/2017  . Chronic bilateral low back pain 03/05/2017  . Dysphagia, idiopathic 03/05/2017  . Opiate withdrawal ([place]) 03/04/2017    Past Medical History:  Diagnosis Date  . Anemia   . Anxiety   . Chronic pain    back, hips and pelvis  . Depression   . Dysmenorrhea   . Endometriosis   . Ovarian cyst     Past Surgical History:  Procedure Laterality Date  . LEEP    . LUMBAR DISC SURGERY    . NASAL SEPTUM SURGERY      Current Outpatient Medications  Medication Sig Dispense Refill  . Buprenorphine HCl-Naloxone HCl 4-1 MG FILM SMARTSIG:1 Strip(s)  Sublingual Every 6 Hours    . FLUoxetine (PROZAC) 20 MG tablet Take 0.5 (10mg ) tablet once daily for 7 days. Then take 1 tablet once daily. 90 tablet 0  . hydrOXYzine (ATARAX/VISTARIL) 25 MG tablet Take 1-2 tablets by mouth every 8 hours as needed for severe anxiety. 60 tablet 0  . ondansetron (ZOFRAN) 4 MG tablet Take 4 mg by mouth as needed.    Marland Kitchen tiZANidine (ZANAFLEX) 4 MG tablet Take 4 mg by mouth every 8 (eight) hours.    . traZODone (DESYREL) 50 MG tablet Take 0.5-1 tablets (25-50 mg total) by mouth at bedtime as needed for sleep. 30 tablet 2  . varenicline (CHANTIX STARTING MONTH PAK) 0.5 MG X 11 & 1 MG X 42 tablet Take one 0.5 mg tablet by mouth once daily for 3 days, then increase to one 0.5 mg tablet twice daily for 4 days, then increase to one 1 mg tablet twice daily. 53 tablet 0   No current facility-administered medications for this visit.  Hasn't started the Chantix yet   ALLERGIES: Sulfa antibiotics  Family History  Problem Relation Age of Onset  .  Arthritis Mother   . Anxiety disorder Mother   . Depression Mother   . Diabetes Mother   . Heart disease Mother   . Hyperlipidemia Mother   . Hypertension Mother   . Thyroid disease Mother   . Birth defects Sister   . Migraines Sister   . Healthy Brother   . Healthy Daughter   . Healthy Sister   . Drug abuse Brother   . Healthy Brother   . Healthy Daughter     Social History   Socioeconomic History  . Marital status: Married    Spouse name: Not on file  . Number of children: Not on file  . Years of education: Not on file  . Highest education level: Not on file  Occupational History  . Not on file  Tobacco Use  . Smoking status: Former Smoker    Packs/day: 1.00    Years: 23.00    Pack years: 23.00    Types: Cigarettes    Quit date: 1998    Years since quitting: 23.8  . Smokeless tobacco: Never Used  Vaping Use  . Vaping Use: Never used  Substance and Sexual Activity  . Alcohol use: No  . Drug use: No  .  Sexual activity: Yes    Birth control/protection: None  Other Topics Concern  . Not on file  Social History Narrative  . Not on file   Social Determinants of Health   Financial Resource Strain:   . Difficulty of Paying Living Expenses: Not on file  Food Insecurity:   . Worried About Charity fundraiser in the Last Year: Not on file  . Ran Out of Food in the Last Year: Not on file  Transportation Needs:   . Lack of Transportation (Medical): Not on file  . Lack of Transportation (Non-Medical): Not on file  Physical Activity:   . Days of Exercise per Week: Not on file  . Minutes of Exercise per Session: Not on file  Stress:   . Feeling of Stress : Not on file  Social Connections:   . Frequency of Communication with Friends and Family: Not on file  . Frequency of Social Gatherings with Friends and Family: Not on file  . Attends Religious Services: Not on file  . Active Member of Clubs or Organizations: Not on file  . Attends Archivist Meetings: Not on file  . Marital Status: Not on file  Intimate Partner Violence:   . Fear of Current or Ex-Partner: Not on file  . Emotionally Abused: Not on file  . Physically Abused: Not on file  . Sexually Abused: Not on file  Smoking 1-1.5 PPD No ETOH.   Review of Systems  All other systems reviewed and are negative.   PHYSICAL EXAMINATION:    BP 110/72   Pulse 99   Ht 5' 5.5" (1.664 m)   Wt 102 lb (46.3 kg)   LMP 05/06/2020 Comment: spotting   SpO2 100%   BMI 16.72 kg/m     General appearance: alert, cooperative and appears stated age Neck: no adenopathy, supple, symmetrical, trachea midline and thyroid normal to inspection and palpation Heart: regular rate and rhythm Lungs: CTAB Abdomen: soft, non-tender; bowel sounds normal; no masses,  no organomegaly Extremities: normal, atraumatic, no cyanosis Skin: normal color, texture and turgor, no rashes or lesions Lymph: normal cervical supraclavicular and inguinal  nodes Neurologic: grossly normal   ASSESSMENT H/O AUB and severe dysmenorrhea H/O LEEP for CIN III with + ECC  H/O opioid dependence on Suboxone Low BMI of 16, she has had a 12 lb weight loss in the last several months secondary to nausea.     PLAN Discussed total laparoscopic hysterectomy, bilateral salpingectomies, cystoscopy, possible treatment of endometriosis. Reviewed the risks of the procedure, including infection, bleeding, damage to bowel/badder/vessels/ureters.  Discussed the possible need for laparotomy. Discussed post operative recovery and risk of cuff dehiscence. All of her questions were answered Will discuss pain management with her pain provider. Will have her f/u with her primary for medical clearance

## 2020-05-08 ENCOUNTER — Telehealth: Payer: Self-pay | Admitting: *Deleted

## 2020-05-08 NOTE — Telephone Encounter (Signed)
Spoke with patient. Advised of recommendations per Dr. Talbert Nan. Patient will f/u with PCP and notify when she is scheduled. Questions answered. Patient agreeable.

## 2020-05-08 NOTE — Telephone Encounter (Signed)
-----   Message from Salvadore Dom, MD sent at 05/07/2020  5:43 PM EST ----- Please have her see her primary to be cleared for surgery

## 2020-05-08 NOTE — Telephone Encounter (Signed)
Spoke with patient. Patient is scheduled for an OV w/ PCP on 11/23 at [facility] will wait for surgical clearance, will notify once received. Patient verbalizes understanding and is agreeable.   Routing to provider for final review. Patient is agreeable to disposition. Will close encounter.

## 2020-05-08 NOTE — Telephone Encounter (Signed)
Patient is calling to update on surgery clearance. Patient states she has made the appointment at her PCP.

## 2020-05-14 ENCOUNTER — Ambulatory Visit (INDEPENDENT_AMBULATORY_CARE_PROVIDER_SITE_OTHER): Payer: 59 | Admitting: Physician Assistant

## 2020-05-14 ENCOUNTER — Other Ambulatory Visit: Payer: Self-pay

## 2020-05-14 ENCOUNTER — Encounter: Payer: Self-pay | Admitting: Physician Assistant

## 2020-05-14 VITALS — BP 123/79 | HR 119 | Temp 97.9°F | Ht 65.5 in | Wt 100.7 lb

## 2020-05-14 DIAGNOSIS — F1911 Other psychoactive substance abuse, in remission: Secondary | ICD-10-CM

## 2020-05-14 DIAGNOSIS — R000 Tachycardia, unspecified: Secondary | ICD-10-CM

## 2020-05-14 DIAGNOSIS — E785 Hyperlipidemia, unspecified: Secondary | ICD-10-CM

## 2020-05-14 DIAGNOSIS — F419 Anxiety disorder, unspecified: Secondary | ICD-10-CM | POA: Diagnosis not present

## 2020-05-14 DIAGNOSIS — Z01818 Encounter for other preprocedural examination: Secondary | ICD-10-CM

## 2020-05-14 DIAGNOSIS — R7303 Prediabetes: Secondary | ICD-10-CM

## 2020-05-14 DIAGNOSIS — R9431 Abnormal electrocardiogram [ECG] [EKG]: Secondary | ICD-10-CM

## 2020-05-14 DIAGNOSIS — F32A Depression, unspecified: Secondary | ICD-10-CM

## 2020-05-14 MED ORDER — HYDROXYZINE HCL 25 MG PO TABS: ORAL_TABLET | ORAL | 1 refills | Status: DC

## 2020-05-14 NOTE — Progress Notes (Signed)
Established Patient Office Visit  Subjective:  Patient ID: Rachel Bennett, female    DOB: Jan 08, 1981  Age: 39 y.o. MRN: [id number]  CC:  Chief Complaint  Patient presents with   Surgical Clearance    HPI RHEANA CASEBOLT presents for preoperative clearance for hysterectomy with salpingectomy with PMHX of anxiety, depression, anemia, chronic panic, AUB, endometriosis, CIN II-III, prediabetes, hyperlipidemia, substance abuse, tobacco abuse, and chronic lumbar pain. Patient complains of nausea, which has been ongoing for a few months which she feels is related to her periods. Forgets to take Zofran. Denies chest pain, palpitations, dyspnea, orthopnea, dizziness or lower extremity swelling. Patient is followed by [facility] for chronic pain syndrome and recently had extensive blood work ordered by Dr. Jackey Loge- [place]. Patient has a family history of heart disease, reports mother had to have stents. Continues to smoke about 1-2 PPD. Was not able to start Chantix due to insurance coverage and cost. Hydroxyzine has helped with anxiety and tolerating Prozac without issues.  Past Medical History:  Diagnosis Date   Anemia    Anxiety    Chronic pain    back, hips and pelvis   Depression    Dysmenorrhea    Elevated lipids    Endometriosis    Ovarian cyst    Prediabetes     Past Surgical History:  Procedure Laterality Date   LEEP     LUMBAR DISC SURGERY     NASAL SEPTUM SURGERY      Family History  Problem Relation Age of Onset   Arthritis Mother    Anxiety disorder Mother    Depression Mother    Diabetes Mother    Heart disease Mother    Hyperlipidemia Mother    Hypertension Mother    Thyroid disease Mother    Birth defects Sister    Migraines Sister    Healthy Brother    Healthy Daughter    Healthy Sister    Drug abuse Brother    Healthy Brother    Healthy Daughter     Social History   Socioeconomic History   Marital status:  Married    Spouse name: Not on file   Number of children: Not on file   Years of education: Not on file   Highest education level: Not on file  Occupational History   Not on file  Tobacco Use   Smoking status: Former Smoker    Packs/day: 1.00    Years: 23.00    Pack years: 23.00    Types: Cigarettes    Quit date: 1998    Years since quitting: 23.9   Smokeless tobacco: Never Used  Scientific laboratory technician Use: Never used  Substance and Sexual Activity   Alcohol use: No   Drug use: No   Sexual activity: Yes    Birth control/protection: None  Other Topics Concern   Not on file  Social History Narrative   Not on file   Social Determinants of Health   Financial Resource Strain:    Difficulty of Paying Living Expenses: Not on file  Food Insecurity:    Worried About Charity fundraiser in the Last Year: Not on file   [employer] of Food in the Last Year: Not on file  Transportation Needs:    Lack of Transportation (Medical): Not on file   Lack of Transportation (Non-Medical): Not on file  Physical Activity:    Days of Exercise per Week: Not on file  Minutes of Exercise per Session: Not on file  Stress:    Feeling of Stress : Not on file  Social Connections:    Frequency of Communication with Friends and Family: Not on file   Frequency of Social Gatherings with Friends and Family: Not on file   Attends Religious Services: Not on file   Active Member of Clubs or Organizations: Not on file   Attends Archivist Meetings: Not on file   Marital Status: Not on file  Intimate Partner Violence:    Fear of Current or Ex-Partner: Not on file   Emotionally Abused: Not on file   Physically Abused: Not on file   Sexually Abused: Not on file    Outpatient Medications Prior to Visit  Medication Sig Dispense Refill   Buprenorphine HCl-Naloxone HCl 4-1 MG FILM SMARTSIG:1 Strip(s) Sublingual Every 6 Hours     FLUoxetine (PROZAC) 20 MG tablet Take  0.5 (10mg ) tablet once daily for 7 days. Then take 1 tablet once daily. 90 tablet 0   ondansetron (ZOFRAN) 4 MG tablet Take 4 mg by mouth as needed.     tiZANidine (ZANAFLEX) 4 MG tablet Take 4 mg by mouth every 8 (eight) hours.     traZODone (DESYREL) 50 MG tablet Take 0.5-1 tablets (25-50 mg total) by mouth at bedtime as needed for sleep. 30 tablet 2   hydrOXYzine (ATARAX/VISTARIL) 25 MG tablet Take 1-2 tablets by mouth every 8 hours as needed for severe anxiety. 60 tablet 0   varenicline (CHANTIX STARTING MONTH PAK) 0.5 MG X 11 & 1 MG X 42 tablet Take one 0.5 mg tablet by mouth once daily for 3 days, then increase to one 0.5 mg tablet twice daily for 4 days, then increase to one 1 mg tablet twice daily. (Patient not taking: Reported on 05/14/2020) 53 tablet 0   No facility-administered medications prior to visit.    Allergies  Allergen Reactions   Sulfa Antibiotics     ROS Review of Systems A fourteen system review of systems was performed and found to be positive as per HPI.   Objective:    Physical Exam Constitutional:      General: She is not in acute distress. HENT:     Head: Normocephalic and atraumatic.     Right Ear: Tympanic membrane and external ear normal. There is no impacted cerumen.     Left Ear: Tympanic membrane and external ear normal. There is no impacted cerumen.     Nose: Nose normal.     Comments: Mild septal deviation    Mouth/Throat:     Mouth: Mucous membranes are dry.     Comments: Abnormal dentition Eyes:     Extraocular Movements: Extraocular movements intact.     Conjunctiva/sclera: Conjunctivae normal.     Pupils: Pupils are equal, round, and reactive to light.  Cardiovascular:     Rate and Rhythm: Regular rhythm. Tachycardia present.     Pulses: Normal pulses.     Heart sounds: Normal heart sounds.  Pulmonary:     Effort: Pulmonary effort is normal. No respiratory distress.     Breath sounds: Normal breath sounds. No stridor.   Abdominal:     General: Abdomen is flat. There is no distension.     Palpations: Abdomen is soft.     Tenderness: There is no abdominal tenderness. There is no right CVA tenderness or left CVA tenderness.  Musculoskeletal:        General: No swelling or deformity.  Cervical back: Normal range of motion and neck supple.  Lymphadenopathy:     Cervical: No cervical adenopathy.  Skin:    General: Skin is dry.     Coloration: Skin is pale.     Findings: No bruising or lesion.  Neurological:     General: No focal deficit present.     Mental Status: She is alert and oriented to person, place, and time.     Sensory: No sensory deficit.     Motor: No weakness.  Psychiatric:        Behavior: Behavior normal.        Thought Content: Thought content normal.        Judgment: Judgment normal.     LMP 05/06/2020 Comment: spotting  Wt Readings from Last 3 Encounters:  05/07/20 102 lb (46.3 kg)  04/16/20 104 lb 14.4 oz (47.6 kg)  04/09/20 106 lb (48.1 kg)     Health Maintenance Due  Topic Date Due   COVID-19 Vaccine (1) Never done    There are no preventive care reminders to display for this patient.  Lab Results  Component Value Date   TSH 2.720 04/08/2020   Lab Results  Component Value Date   WBC 5.9 04/18/2020   HGB 11.6 04/18/2020   HCT 37.4 04/18/2020   MCV 82 04/18/2020   PLT 313 04/18/2020   Lab Results  Component Value Date   NA 140 04/18/2020   K 3.4 (L) 04/18/2020   CO2 24 04/08/2020   GLUCOSE 91 04/08/2020   BUN 14 04/08/2020   CREATININE 0.84 04/08/2020   BILITOT 0.6 04/08/2020   ALKPHOS 73 04/08/2020   AST 11 04/08/2020   ALT 7 04/08/2020   PROT 7.7 04/08/2020   ALBUMIN 4.7 04/08/2020   CALCIUM 9.6 04/22/2020   Lab Results  Component Value Date   CHOL 226 (H) 04/08/2020   Lab Results  Component Value Date   HDL 36 (L) 04/08/2020   Lab Results  Component Value Date   LDLCALC 174 (H) 04/08/2020   Lab Results  Component Value Date    TRIG 91 04/08/2020   Lab Results  Component Value Date   CHOLHDL 6.3 (H) 04/08/2020   Lab Results  Component Value Date   HGBA1C 5.8 (H) 04/08/2020      Assessment & Plan:   Problem List Items Addressed This Visit      Other   Prediabetes    Other Visit Diagnoses    Preoperative clearance    -  Primary   Relevant Orders   EKG 12-Lead   ECHOCARDIOGRAM COMPLETE   Ambulatory referral to Cardiology   Anxiety and depression       Relevant Medications   hydrOXYzine (ATARAX/VISTARIL) 25 MG tablet   Tachycardia       Relevant Orders   ECHOCARDIOGRAM COMPLETE   Ambulatory referral to Cardiology   History of substance abuse ([place])       Relevant Orders   ECHOCARDIOGRAM COMPLETE   Ambulatory referral to Cardiology   Abnormal EKG       Relevant Orders   ECHOCARDIOGRAM COMPLETE   Ambulatory referral to Cardiology   Hyperlipidemia, unspecified hyperlipidemia type         Preoperative clearance: -Patient's recent CMP wnl, lipid panel elevated (total cholesterol 226, triglycerides 91, HDL 36, LDL 174), A1c 5.8, TSH wnl, and CBC stable. -Vital signs wnl with exception of elevated pulse at 119. EKG revealed NSR with right atrial enlargement and pulmonary disease pattern, rate  86, no acute ST-T wave changes. -Discussed with patient possible referral to cardiology for further evaluation of abnormal EKG findings, especially with tachycardia, history of substance abuse and family history of heart diease. Discussed case with SP Dr. Madilyn Fireman who agrees with POC. Was able to find prior EKG from 2018 which revealed possible left atrial enlargement. Will proceed with cardiology referral for surgical clearance and will order echocardiogram.  -Advised to take Zofran as directed to help improve nausea and appetite.  -Attempted to contact patient to notify of final plan but no answer. Medical staff will attempt tomorrow.  Anxiety and depression: -PHQ-9 score of 11, improved from 17 -Continue  current medication regimen. QTc on EKG 445 ms (<500 ms). Pending cardiology evaluation will consider medication adjustments if indicated or recommended.  -Follow up in 3 months for medication management.  Tobacco abuse: -Recommend to reduce tobacco use and consider other treatment options that may be more cost affordable such as nicotine patches.   Prediabetes: -Most recent A1c 5.8, stable. -Recommend to monitor carbohydrates and glucose to improve A1c. -Encourage to stay as active as possible. Physical activity limited by chronic pain. -Will continue to monitor.     Meds ordered this encounter  Medications   hydrOXYzine (ATARAX/VISTARIL) 25 MG tablet    Sig: Take 1-2 tablets by mouth every 8 hours as needed for severe anxiety.    Dispense:  60 tablet    Refill:  1    Order Specific Question:   Supervising Provider    Answer:   Beatrice Lecher D [[id number]]    Follow-up: Return in about 3 months (around 08/14/2020) for Mood.   Note:  This note was prepared with assistance of Dragon voice recognition software. Occasional wrong-word or sound-a-like substitutions may have occurred due to the inherent limitations of voice recognition software.   Lorrene Reid, PA-C

## 2020-05-15 ENCOUNTER — Telehealth: Payer: Self-pay

## 2020-05-15 DIAGNOSIS — N946 Dysmenorrhea, unspecified: Secondary | ICD-10-CM | POA: Insufficient documentation

## 2020-05-15 DIAGNOSIS — D649 Anemia, unspecified: Secondary | ICD-10-CM | POA: Insufficient documentation

## 2020-05-15 DIAGNOSIS — G8929 Other chronic pain: Secondary | ICD-10-CM | POA: Insufficient documentation

## 2020-05-15 DIAGNOSIS — N809 Endometriosis, unspecified: Secondary | ICD-10-CM | POA: Insufficient documentation

## 2020-05-15 DIAGNOSIS — F32A Depression, unspecified: Secondary | ICD-10-CM | POA: Insufficient documentation

## 2020-05-15 DIAGNOSIS — F419 Anxiety disorder, unspecified: Secondary | ICD-10-CM | POA: Insufficient documentation

## 2020-05-15 DIAGNOSIS — N83209 Unspecified ovarian cyst, unspecified side: Secondary | ICD-10-CM | POA: Insufficient documentation

## 2020-05-15 NOTE — Telephone Encounter (Signed)
Spoke with patient who said she was unable to write information down and requested me to call and leave apt details on her voice mail. This has been done. AS, CMA

## 2020-05-15 NOTE — Telephone Encounter (Signed)
Scheduled pt for appt with Dr. Geraldo Pitter at [facility] in [place] for 05/20/2020 @ 2:40pm per Lorrene Reid, PA-C.  Pt is aware of appt.    Called pt back to inform her of appt for echocardiogram, which is scheduled for 05/20/2021 @ 9:15am at [facility], Entrance C.  LVM for pt to return call so that we can provide information regarding this appt.    Charyl Bigger, CMA

## 2020-05-20 ENCOUNTER — Ambulatory Visit (HOSPITAL_COMMUNITY)
Admission: RE | Admit: 2020-05-20 | Discharge: 2020-05-20 | Disposition: A | Discharge status: Home / Self Care | Payer: 59 | Source: Ambulatory Visit | Attending: Physician Assistant | Admitting: Physician Assistant

## 2020-05-20 ENCOUNTER — Ambulatory Visit (INDEPENDENT_AMBULATORY_CARE_PROVIDER_SITE_OTHER): Payer: 59 | Admitting: Cardiology

## 2020-05-20 ENCOUNTER — Other Ambulatory Visit: Payer: Self-pay

## 2020-05-20 ENCOUNTER — Encounter: Payer: Self-pay | Admitting: Cardiology

## 2020-05-20 DIAGNOSIS — Z01818 Encounter for other preprocedural examination: Secondary | ICD-10-CM | POA: Diagnosis not present

## 2020-05-20 DIAGNOSIS — R000 Tachycardia, unspecified: Secondary | ICD-10-CM | POA: Insufficient documentation

## 2020-05-20 DIAGNOSIS — F17210 Nicotine dependence, cigarettes, uncomplicated: Secondary | ICD-10-CM

## 2020-05-20 DIAGNOSIS — R9431 Abnormal electrocardiogram [ECG] [EKG]: Secondary | ICD-10-CM | POA: Insufficient documentation

## 2020-05-20 DIAGNOSIS — F1911 Other psychoactive substance abuse, in remission: Secondary | ICD-10-CM | POA: Diagnosis not present

## 2020-05-20 DIAGNOSIS — E782 Mixed hyperlipidemia: Secondary | ICD-10-CM

## 2020-05-20 DIAGNOSIS — Z01810 Encounter for preprocedural cardiovascular examination: Secondary | ICD-10-CM

## 2020-05-20 HISTORY — DX: Nicotine dependence, cigarettes, uncomplicated: F17.210

## 2020-05-20 HISTORY — DX: Abnormal electrocardiogram (ECG) (EKG): R94.31

## 2020-05-20 HISTORY — DX: Mixed hyperlipidemia: E78.2

## 2020-05-20 HISTORY — DX: Encounter for preprocedural cardiovascular examination: Z01.810

## 2020-05-20 LAB — ECHOCARDIOGRAM COMPLETE
Area-P 1/2: 4.15 cm2
S' Lateral: 2.7 cm

## 2020-05-20 NOTE — Progress Notes (Signed)
Cardiology Office Note:    Date:  05/20/2020   ID:  Rachel Bennett, DOB 1980-08-22, MRN [id number]  PCP:  Lorrene Reid, PA-C  Cardiologist:  Jenean Lindau, MD   Referring MD: Lorrene Reid, PA-C    ASSESSMENT:    1. Preoperative cardiovascular examination   2. Mixed dyslipidemia   3. Cigarette smoker   4. Abnormal EKG    PLAN:    In order of problems listed above:  1. Primary prevention stressed with the patient.  Importance of compliance with diet medication stressed and she vocalized understanding. 2. Cigarette smoker: I spent 5 minutes with the patient discussing solely about smoking. Smoking cessation was counseled. I suggested to the patient also different medications and pharmacological interventions. Patient is keen to try stopping on its own at this time. He will get back to me if he needs any further assistance in this matter. 3. Cardiac murmur and abnormal EKG: Echocardiogram was done at [facility] today.  We will try to obtain a copy of that report and discussed with her. 4. Preoperative cardiovascular risk stratification: In view of chest pain and multiple risk factors for coronary artery disease we will do a Lexiscan sestamibi.  If this is negative then she is not at high risk for coronary events during the aforementioned surgery.  Meticulous hemodynamic monitoring will further reduce the risk of coronary events.  Patient and daughter had multiple questions which were answered to their satisfaction. 5. Mixed dyslipidemia: Lipids are markedly elevated diet emphasized risks of elevated lipids emphasized and she plans to do better.  In the future she might need lipid-lowering therapy but this I would leave it to the discretion of her long-term primary care provider. 6. Patient will be seen in follow-up appointment in 6 weeks or earlier if the patient has any concerns 7.    Medication Adjustments/Labs and Tests Ordered: Current medicines are reviewed at  length with the patient today.  Concerns regarding medicines are outlined above.  No orders of the defined types were placed in this encounter.  No orders of the defined types were placed in this encounter.    No chief complaint on file.    History of Present Illness:    Rachel Bennett is a 39 y.o. female.  Patient has past medical history of mixed dyslipidemia.  She is a heavy chronic smoker.  She has significant pain issues.  She is planning to undergo hysterectomy and is here for preop assessment.  Patient denies any chest pain orthopnea or PND.  She tells me that she occasionally has chest tightness when she has panic attacks.  She tells me that she suffers from PTSD.  At the time of my evaluation, the patient is alert awake oriented and in no distress.  Past Medical History:  Diagnosis Date  . Anemia   . Anxiety   . Atypical nevi 05/19/2017  . Chronic bilateral low back pain 03/05/2017  . Chronic pain    back, hips and pelvis  . 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 Depression   . Dysmenorrhea   . Dysphagia, idiopathic 03/05/2017  . Elevated lipids   . Encounter for insertion of mirena IUD 05/19/2017  . Endometriosis   . Healthcare maintenance 05/19/2017  . High serum low density lipoprotein (LDL) cholesterol 05/19/2017  . Iron deficiency anemia 05/18/2017  . Opiate withdrawal ([place]) 03/04/2017  . Ovarian cyst   . Prediabetes   . Screening examination for STD (sexually transmitted disease) 05/19/2017  .  Tobacco use 05/19/2017  . Vitamin D deficiency 05/19/2017    Past Surgical History:  Procedure Laterality Date  . LEEP    . LUMBAR DISC SURGERY    . NASAL SEPTUM SURGERY      Current Medications: Current Meds  Medication Sig  . Buprenorphine HCl-Naloxone HCl 4-1 MG FILM SMARTSIG:1 Strip(s) Sublingual Every 6 Hours  . FLUoxetine (PROZAC) 10 MG capsule Take 10 mg by mouth daily.  . hydrOXYzine (ATARAX/VISTARIL) 25 MG tablet Take 1-2 tablets by mouth every 8  hours as needed for severe anxiety.  . ondansetron (ZOFRAN) 4 MG tablet Take 4 mg by mouth as needed.  Marland Kitchen tiZANidine (ZANAFLEX) 4 MG tablet Take 4 mg by mouth every 8 (eight) hours.  . traZODone (DESYREL) 50 MG tablet Take 0.5-1 tablets (25-50 mg total) by mouth at bedtime as needed for sleep.     Allergies:   Sulfa antibiotics   Social History   Socioeconomic History  . Marital status: Married    Spouse name: Not on file  . Number of children: Not on file  . Years of education: Not on file  . Highest education level: Not on file  Occupational History  . Not on file  Tobacco Use  . Smoking status: Current Every Day Smoker    Packs/day: 1.50    Years: 23.00    Pack years: 34.50    Types: Cigarettes    Last attempt to quit: 1998    Years since quitting: 23.9  . Smokeless tobacco: Never Used  Vaping Use  . Vaping Use: Never used  Substance and Sexual Activity  . Alcohol use: No  . Drug use: No  . Sexual activity: Yes    Birth control/protection: None  Other Topics Concern  . Not on file  Social History Narrative  . Not on file   Social Determinants of Health   Financial Resource Strain:   . Difficulty of Paying Living Expenses: Not on file  Food Insecurity:   . Worried About Charity fundraiser in the Last Year: Not on file  . Ran Out of Food in the Last Year: Not on file  Transportation Needs:   . Lack of Transportation (Medical): Not on file  . Lack of Transportation (Non-Medical): Not on file  Physical Activity:   . Days of Exercise per Week: Not on file  . Minutes of Exercise per Session: Not on file  Stress:   . Feeling of Stress : Not on file  Social Connections:   . Frequency of Communication with Friends and Family: Not on file  . Frequency of Social Gatherings with Friends and Family: Not on file  . Attends Religious Services: Not on file  . Active Member of Clubs or Organizations: Not on file  . Attends Archivist Meetings: Not on file  .  Marital Status: Not on file     Family History: The patient's family history includes Anxiety disorder in her mother; Arthritis in her mother; Birth defects in her sister; Depression in her mother; Diabetes in her mother; Drug abuse in her brother; Healthy in her brother, brother, daughter, daughter, and sister; Heart disease in her mother; Hyperlipidemia in her mother; Hypertension in her mother; Migraines in her sister; Thyroid disease in her mother.  ROS:   Please see the history of present illness.    All other systems reviewed and are negative.  EKGs/Labs/Other Studies Reviewed:    The following studies were reviewed today: EKG reveals sinus rhythm  pulmonary disease pattern and nonspecific ST-T changes   Recent Labs: 04/08/2020: ALT 7; BUN 14; Creatinine, Ser 0.84; TSH 2.720 04/18/2020: Hemoglobin 11.6; Magnesium 2.0; Platelets 313; Potassium 3.4; Sodium 140  Recent Lipid Panel    Component Value Date/Time   CHOL 226 (H) 04/08/2020 1035   TRIG 91 04/08/2020 1035   HDL 36 (L) 04/08/2020 1035   CHOLHDL 6.3 (H) 04/08/2020 1035   LDLCALC 174 (H) 04/08/2020 1035    Physical Exam:    VS:  BP 118/88   Pulse 91   Ht 5' 5.5" (1.664 m)   Wt 102 lb 9.6 oz (46.5 kg)   LMP 05/06/2020 Comment: spotting   SpO2 94%   BMI 16.81 kg/m     Wt Readings from Last 3 Encounters:  05/20/20 102 lb 9.6 oz (46.5 kg)  05/14/20 100 lb 11.2 oz (45.7 kg)  05/07/20 102 lb (46.3 kg)     GEN: Patient is in no acute distress HEENT: Normal NECK: No JVD; No carotid bruits LYMPHATICS: No lymphadenopathy CARDIAC: Hear sounds regular, 2/6 systolic murmur at the apex. RESPIRATORY:  Clear to auscultation without rales, wheezing or rhonchi  ABDOMEN: Soft, non-tender, non-distended MUSCULOSKELETAL:  No edema; No deformity  SKIN: Warm and dry NEUROLOGIC:  Alert and oriented x 3 PSYCHIATRIC:  Normal affect   Signed, Jenean Lindau, MD  05/20/2020 3:22 PM     [facility]

## 2020-05-20 NOTE — Patient Instructions (Signed)
Medication Instructions:  No medication changes. *If you need a refill on your cardiac medications before your next appointment, please call your pharmacy*   Lab Work: None ordered If you have labs (blood work) drawn today and your tests are completely normal, you will receive your results only by: Marland Kitchen MyChart Message (if you have MyChart) OR . A paper copy in the mail If you have any lab test that is abnormal or we need to change your treatment, we will call you to review the results.   Testing/Procedures: Your physician has requested that you have a lexiscan myoview. For further information please visit [email or web address]. Please follow instruction sheet, as given.  The test will take approximately 3 to 4 hours to complete; you may bring reading material.  If someone comes with you to your appointment, they will need to remain in the main lobby due to limited space in the testing area. **If you are pregnant or breastfeeding, please notify the nuclear lab prior to your appointment**  How to prepare for your Myocardial Perfusion Test: . Do not eat or drink 3 hours prior to your test, except you may have water. . Do not consume products containing caffeine (regular or decaffeinated) 12 hours prior to your test. (ex: coffee, chocolate, sodas, tea). . Do bring a list of your current medications with you.  If not listed below, you may take your medications as normal. . Do wear comfortable clothes (no dresses or overalls) and walking shoes, tennis shoes preferred (No heels or open toe shoes are allowed). . Do NOT wear cologne, perfume, aftershave, or lotions (deodorant is allowed). . If these instructions are not followed, your test will have to be rescheduled.    Follow-Up: At [facility], you and your health needs are our priority.  As part of our continuing mission to provide you with exceptional heart care, we have created designated Provider Care Teams.  These Care Teams include your  primary Cardiologist (physician) and Advanced Practice Providers (APPs -  Physician Assistants and Nurse Practitioners) who all work together to provide you with the care you need, when you need it.  We recommend signing up for the patient portal called "MyChart".  Sign up information is provided on this After Visit Summary.  MyChart is used to connect with patients for Virtual Visits (Telemedicine).  Patients are able to view lab/test results, encounter notes, upcoming appointments, etc.  Non-urgent messages can be sent to your provider as well.   To learn more about what you can do with MyChart, go to [email or web address].    Your next appointment:   6 week(s)  The format for your next appointment:   In Person  Provider:   Jyl Heinz, MD   Other Instructions  Cardiac Nuclear Scan A cardiac nuclear scan is a test that is done to check the flow of blood to your heart. It is done when you are resting and when you are exercising. The test looks for problems such as:  Not enough blood reaching a portion of the heart.  The heart muscle not working as it should. You may need this test if:  You have heart disease.  You have had lab results that are not normal.  You have had heart surgery or a balloon procedure to open up blocked arteries (angioplasty).  You have chest pain.  You have shortness of breath. In this test, a special dye (tracer) is put into your bloodstream. The tracer will travel to your heart. A camera  will then take pictures of your heart to see how the tracer moves through your heart. This test is usually done at a hospital and takes 2-4 hours. Tell a doctor about:  Any allergies you have.  All medicines you are taking, including vitamins, herbs, eye drops, creams, and over-the-counter medicines.  Any problems you or family members have had with anesthetic medicines.  Any blood disorders you have.  Any surgeries you have had.  Any medical conditions you  have.  Whether you are pregnant or may be pregnant. What are the risks? Generally, this is a safe test. However, problems may occur, such as:  Serious chest pain and heart attack. This is only a risk if the stress portion of the test is done.  Rapid heartbeat.  A feeling of warmth in your chest. This feeling usually does not last long.  Allergic reaction to the tracer. What happens before the test?  Ask your doctor about changing or stopping your normal medicines. This is important.  Follow instructions from your doctor about what you cannot eat or drink.  Remove your jewelry on the day of the test. What happens during the test?  An IV tube will be inserted into one of your veins.  Your doctor will give you a small amount of tracer through the IV tube.  You will wait for 20-40 minutes while the tracer moves through your bloodstream.  Your heart will be monitored with an electrocardiogram (ECG).  You will lie down on an exam table.  Pictures of your heart will be taken for about 15-20 minutes.  You may also have a stress test. For this test, one of these things may be done: ? You will be asked to exercise on a treadmill or a stationary bike. ? You will be given medicines that will make your heart work harder. This is done if you are unable to exercise.  When blood flow to your heart has peaked, a tracer will again be given through the IV tube.  After 20-40 minutes, you will get back on the exam table. More pictures will be taken of your heart.  Depending on the tracer that is used, more pictures may need to be taken 3-4 hours later.  Your IV tube will be removed when the test is over. The test may vary among doctors and hospitals. What happens after the test?  Ask your doctor: ? Whether you can return to your normal schedule, including diet, activities, and medicines. ? Whether you should drink more fluids. This will help to remove the tracer from your body. Drink  enough fluid to keep your pee (urine) pale yellow.  Ask your doctor, or the department that is doing the test: ? When will my results be ready? ? How will I get my results? Summary  A cardiac nuclear scan is a test that is done to check the flow of blood to your heart.  Tell your doctor whether you are pregnant or may be pregnant.  Before the test, ask your doctor about changing or stopping your normal medicines. This is important.  Ask your doctor whether you can return to your normal activities. You may be asked to drink more fluids. This information is not intended to replace advice given to you by your health care provider. Make sure you discuss any questions you have with your health care provider. Document Revised: 09/28/2018 Document Reviewed: 11/22/2017 Elsevier Patient Education  [place].

## 2020-05-20 NOTE — Progress Notes (Signed)
  Echocardiogram 2D Echocardiogram has been performed.  Rachel Bennett M 05/20/2020, 10:11 AM

## 2020-05-21 ENCOUNTER — Telehealth: Payer: Self-pay | Admitting: Cardiology

## 2020-05-21 ENCOUNTER — Telehealth: Payer: Self-pay | Admitting: *Deleted

## 2020-05-21 ENCOUNTER — Telehealth (HOSPITAL_COMMUNITY): Payer: Self-pay | Admitting: Radiology

## 2020-05-21 NOTE — Telephone Encounter (Signed)
    Sharee Pimple from [facility] women's health calling, she would like to know if they can get the pt's results of stress test same day on 12/02 so they don't have to r/s pt's procedure.

## 2020-05-21 NOTE — Telephone Encounter (Signed)
Patient given detailed instructions per Myocardial Perfusion Study Information Sheet for the test on 05/23/2020 at 11:30. Patient notified to arrive 15 minutes early and that it is imperative to arrive on time for appointment to keep from having the test rescheduled.  If you need to cancel or reschedule your appointment, please call the office within 24 hours of your appointment. . Patient verbalized understanding.EHK

## 2020-05-21 NOTE — Telephone Encounter (Signed)
Spoke with Tyson Dense, NP at [facility]. Was advised Myocardial Perfusion should be able to be read same day and results by 05/24/20.   Routing to Dr. Rosann Auerbach.   Will await results.

## 2020-05-21 NOTE — Telephone Encounter (Signed)
Sharee Pimple advised that the pt is scheduled for her stress test in the [place] office and that the results will be in Epic as so the upcoming surgery doesn't need to be rescheduled.

## 2020-05-21 NOTE — Telephone Encounter (Signed)
Patient is scheduled for TLH/BS/Cysto, possible Tx of endometriosis on 05/28/20.  Cardiology surgical clearance pending.  Patient is scheduled for Myocardial Perfusion on 05/23/20.  Reviewed with Dr. Talbert Nan.  Call placed to [place], spoke with [facility]. Asked if report can be read same day to avoid r/s surgery. Message forwarded to Dr. Julien Nordmann nurse for return call.

## 2020-05-22 NOTE — Progress Notes (Signed)
Pt. Needs orders for upcomming surgery.PST and labs appointment on 05/23/20.Thanks

## 2020-05-22 NOTE — Patient Instructions (Addendum)
DUE TO COVID-19 ONLY ONE VISITOR IS ALLOWED IN WAITING ROOM (VISITOR WILL HAVE A TEMPERATURE CHECK ON ARRIVAL AND MUST WEAR A FACE MASK THE ENTIRE TIME.)  ONCE YOU ARE ADMITTED TO YOUR PRIVATE ROOM, THE SAME ONE VISITOR IS ALLOWED TO VISIT DURING VISITING HOURS ONLY.  Your COVID swab testing is scheduled for: 05/24/20  at 10:00 am , You must self quarantine after your testing per handout given to you at the testing site. [place] [address] [place], [place] [address]  (Must self quarantine after testing. Follow instructions on handout.)       Your procedure is scheduled on:05/28/20  Report to [place] AT: 5:30  A. M.   Call this number if you have problems the morning of surgery:[phone].   OUR ADDRESS IS [place].  WE ARE LOCATED IN THE [place]                                   [address].                                     REMEMBER:   DO NOT EAT FOOD OR DRINK LIQUIDS AFTER MIDNIGHT .    BRUSH YOUR TEETH THE MORNING OF SURGERY.  TAKE THESE MEDICATIONS MORNING OF SURGERY WITH A SIP OF WATER:  fluoxetine  DO NOT WEAR JEWERLY, MAKE UP, OR NAIL POLISH.  DO NOT WEAR LOTIONS, POWDERS, PERFUMES/COLOGNE OR DEODORANT.  DO NOT SHAVE FOR 24 HOURS PRIOR TO DAY OF SURGERY.  CONTACTS, GLASSES, OR DENTURES MAY NOT BE WORN TO SURGERY.                                    [facility] IS NOT RESPONSIBLE  FOR ANY BELONGINGS.         YOU MAY BRING A SMALL OVERNIGHT [place] - Preparing for Surgery Before surgery, you can play an important role.  Because skin is not sterile, your skin needs to be as free of germs as possible.  You can reduce the number of germs on your skin by washing with CHG (chlorahexidine gluconate) soap before surgery.  CHG is an antiseptic cleaner which kills germs and bonds with the skin to continue killing germs even after washing. Please DO NOT use if you have an allergy to  CHG or antibacterial soaps.  If your skin becomes reddened/irritated stop using the CHG and inform your nurse when you arrive at Short Stay. Do not shave (including legs and underarms) for at least 48 hours prior to the first CHG shower.  You may shave your face/neck. Please follow these instructions carefully:  1.  Shower with CHG Soap the night before surgery and the  morning of Surgery.  2.  If you choose to wash your hair, wash your hair first as usual with your  normal  shampoo.  3.  After you shampoo,  rinse your hair and body thoroughly to remove the  shampoo.                           4.  Use CHG as you would any other liquid soap.  You can apply chg directly  to the skin and wash                       Gently with a scrungie or clean washcloth.  5.  Apply the CHG Soap to your body ONLY FROM THE NECK DOWN.   Do not use on face/ open                           Wound or open sores. Avoid contact with eyes, ears mouth and genitals (private parts).                       Wash face,  Genitals (private parts) with your normal soap.             6.  Wash thoroughly, paying special attention to the area where your surgery  will be performed.  7.  Thoroughly rinse your body with warm water from the neck down.  8.  DO NOT shower/wash with your normal soap after using and rinsing off  the CHG Soap.                9.  Pat yourself dry with a clean towel.            10.  Wear clean pajamas.            11.  Place clean sheets on your bed the night of your first shower and do not  sleep with pets. Day of Surgery : Do not apply any lotions/deodorants the morning of surgery.  Please wear clean clothes to the hospital/surgery center.  FAILURE TO FOLLOW THESE INSTRUCTIONS MAY RESULT IN THE CANCELLATION OF YOUR SURGERY PATIENT SIGNATURE_________________________________  NURSE SIGNATURE__________________________________  ________________________________________________________________________

## 2020-05-23 ENCOUNTER — Encounter (HOSPITAL_COMMUNITY): Payer: Self-pay

## 2020-05-23 ENCOUNTER — Ambulatory Visit (INDEPENDENT_AMBULATORY_CARE_PROVIDER_SITE_OTHER): Payer: 59

## 2020-05-23 ENCOUNTER — Encounter (HOSPITAL_COMMUNITY)
Admission: RE | Admit: 2020-05-23 | Discharge: 2020-05-23 | Disposition: A | Discharge status: Home / Self Care | Payer: 59 | Source: Ambulatory Visit | Attending: Obstetrics and Gynecology | Admitting: Obstetrics and Gynecology

## 2020-05-23 ENCOUNTER — Other Ambulatory Visit: Payer: Self-pay

## 2020-05-23 DIAGNOSIS — Z01812 Encounter for preprocedural laboratory examination: Secondary | ICD-10-CM | POA: Diagnosis not present

## 2020-05-23 DIAGNOSIS — R9431 Abnormal electrocardiogram [ECG] [EKG]: Secondary | ICD-10-CM

## 2020-05-23 DIAGNOSIS — Z01810 Encounter for preprocedural cardiovascular examination: Secondary | ICD-10-CM

## 2020-05-23 HISTORY — DX: Prediabetes: R73.03

## 2020-05-23 LAB — MYOCARDIAL PERFUSION IMAGING
LV dias vol: 67 mL (ref 46–106)
LV sys vol: 37 mL
Peak HR: 108 {beats}/min
Rest HR: 68 {beats}/min
SDS: 0
SRS: 0
SSS: 0
TID: 1.06

## 2020-05-23 LAB — CBC
HCT: 39.9 % (ref 36.0–46.0)
Hemoglobin: 12.4 g/dL (ref 12.0–15.0)
MCH: 26.1 pg (ref 26.0–34.0)
MCHC: 31.1 g/dL (ref 30.0–36.0)
MCV: 83.8 fL (ref 80.0–100.0)
Platelets: 358 10*3/uL (ref 150–400)
RBC: 4.76 MIL/uL (ref 3.87–5.11)
RDW: 17.9 % — ABNORMAL HIGH (ref 11.5–15.5)
WBC: 7.5 10*3/uL (ref 4.0–10.5)
nRBC: 0 % (ref 0.0–0.2)

## 2020-05-23 MED ORDER — TECHNETIUM TC 99M TETROFOSMIN IV KIT
31.1000 | PACK | Freq: Once | INTRAVENOUS | Status: AC | PRN
  Administered 2020-05-23: 31.1 via INTRAVENOUS

## 2020-05-23 MED ORDER — REGADENOSON 0.4 MG/5ML IV SOLN
0.4000 mg | Freq: Once | INTRAVENOUS | Status: AC
  Administered 2020-05-23: 0.4 mg via INTRAVENOUS

## 2020-05-23 MED ORDER — TECHNETIUM TC 99M TETROFOSMIN IV KIT
10.4000 | PACK | Freq: Once | INTRAVENOUS | Status: AC | PRN
  Administered 2020-05-23: 10.4 via INTRAVENOUS

## 2020-05-23 NOTE — Progress Notes (Signed)
COVID Vaccine Completed: No Date COVID Vaccine completed: COVID vaccine manufacturer: [employer]   PCP - Lorrene Reid. PAC. LOV: 05/14/20 Cardiologist - Dr. Jyl Heinz. LOV: 05/20/20. Chest x-ray -  EKG - 05/14/20 Stress Test -  ECHO - 05/20/20 Cardiac Cath -  Pacemaker/ICD device last checked: A1-C: 5.8: 04/08/20. Sleep Study -  CPAP -   Fasting Blood Sugar -  Checks Blood Sugar _____ times a day  Blood Thinner Instructions: Aspirin Instructions: Last Dose:  Anesthesia review:   Patient denies shortness of breath, fever, cough and chest pain at PAT appointment   Patient verbalized understanding of instructions that were given to them at the PAT appointment. Patient was also instructed that they will need to review over the PAT instructions again at home before surgery.

## 2020-05-23 NOTE — Telephone Encounter (Signed)
Please keep me posted. Thank you!

## 2020-05-24 ENCOUNTER — Other Ambulatory Visit (HOSPITAL_COMMUNITY)
Admission: RE | Admit: 2020-05-24 | Discharge: 2020-05-24 | Disposition: A | Discharge status: Home / Self Care | Payer: 59 | Source: Ambulatory Visit | Attending: Obstetrics and Gynecology | Admitting: Obstetrics and Gynecology

## 2020-05-24 DIAGNOSIS — Z01812 Encounter for preprocedural laboratory examination: Secondary | ICD-10-CM | POA: Insufficient documentation

## 2020-05-24 DIAGNOSIS — Z20822 Contact with and (suspected) exposure to covid-19: Secondary | ICD-10-CM | POA: Insufficient documentation

## 2020-05-24 LAB — SARS CORONAVIRUS 2 (TAT 6-24 HRS): SARS Coronavirus 2: NEGATIVE

## 2020-05-24 NOTE — Telephone Encounter (Signed)
Call placed to [facility], spoke with Selena. Called for update on surgical clearance, patient scheduled for surgery on 12/7, myocardial perfusion completed 12/2.  Was advised results are available in Epic. I requested letter for surgical clearance to be faxed to [phone].    Jenean Lindau, MD  05/23/2020 5:05 PM EST     The results of the study is unremarkable. Please inform patient. I will discuss in detail at next appointment. [facility] primary care/referring physician Jenean Lindau, MD 05/23/2020 5:05 PM     Routing to Dr. Talbert Nan

## 2020-05-24 NOTE — Telephone Encounter (Signed)
Surgical clearance received from cardiology. Copy of letter faxed to [facility] PAT, original to Dr. Talbert Nan to review and sign for scan.

## 2020-05-24 NOTE — Telephone Encounter (Signed)
Sharee Pimple from [facility] health calling back requesting the clearance be faxed to them. Fax: [phone]

## 2020-05-24 NOTE — Telephone Encounter (Signed)
   Primary Cardiologist: Jenean Lindau, MD  Chart reviewed as part of pre-operative protocol coverage. Given past medical history and time since last visit, based on ACC/AHA guidelines, Rachel Bennett would be at acceptable risk for the planned procedure without further cardiovascular testing.    I will route this recommendation to the requesting party via Epic fax function and remove from pre-op pool.  Please call with questions.  [name] NP-C    05/24/2020, 11:40 AM [address] Office [phone] Fax [phone]

## 2020-05-27 NOTE — Telephone Encounter (Signed)
Patient want to make sure everything is good for surgery Tuesday.

## 2020-05-27 NOTE — Telephone Encounter (Signed)
Reviewed with Dr. Talbert Nan. Dr. Talbert Nan spoke with patient moments ago, ok to proceed with surgery as scheduled. Call returned to patient. No additional questions. Patient is aware will proceed with surgery as scheduled.   Routing to provider for final review. Patient is agreeable to disposition. Will close encounter.

## 2020-05-27 NOTE — Anesthesia Preprocedure Evaluation (Signed)
Anesthesia Evaluation  Patient identified by MRN, date of birth, ID band Patient awake    Reviewed: Allergy & Precautions, NPO status , Patient's Chart, lab work & pertinent test results  Airway Mallampati: II  TM Distance: >3 FB Neck ROM: Full    Dental  (+) Dental Advisory Given   Pulmonary COPD, Current Smoker,    breath sounds clear to auscultation       Cardiovascular negative cardio ROS   Rhythm:Regular Rate:Normal     Neuro/Psych negative neurological ROS     GI/Hepatic negative GI ROS, Neg liver ROS,   Endo/Other  negative endocrine ROS  Renal/GU negative Renal ROS     Musculoskeletal   Abdominal   Peds  Hematology  (+) anemia ,   Anesthesia Other Findings Dental one upper tooth with severe decay, 4 front lower also very decayed   Reproductive/Obstetrics                           Lab Results  Component Value Date   WBC 7.5 05/23/2020   HGB 12.4 05/23/2020   HCT 39.9 05/23/2020   MCV 83.8 05/23/2020   PLT 358 05/23/2020    Anesthesia Physical Anesthesia Plan  ASA: III  Anesthesia Plan: General   Post-op Pain Management:    Induction: Intravenous  PONV Risk Score and Plan: 2 and Dexamethasone, Ondansetron and Treatment may vary due to age or medical condition  Airway Management Planned: Oral ETT  Additional Equipment: None  Intra-op Plan:   Post-operative Plan: Extubation in OR  Informed Consent: I have reviewed the patients History and Physical, chart, labs and discussed the procedure including the risks, benefits and alternatives for the proposed anesthesia with the patient or authorized representative who has indicated his/her understanding and acceptance.     Dental advisory given  Plan Discussed with: CRNA  Anesthesia Plan Comments: (ERAS)       Anesthesia Quick Evaluation

## 2020-05-28 ENCOUNTER — Encounter (HOSPITAL_BASED_OUTPATIENT_CLINIC_OR_DEPARTMENT_OTHER)
Admission: RE | Disposition: A | Discharge status: Home / Self Care | Payer: Self-pay | Source: Home / Self Care | Attending: Obstetrics and Gynecology

## 2020-05-28 ENCOUNTER — Ambulatory Visit (HOSPITAL_BASED_OUTPATIENT_CLINIC_OR_DEPARTMENT_OTHER): Payer: 59 | Admitting: Anesthesiology

## 2020-05-28 ENCOUNTER — Ambulatory Visit (HOSPITAL_BASED_OUTPATIENT_CLINIC_OR_DEPARTMENT_OTHER): Payer: 59 | Admitting: Physician Assistant

## 2020-05-28 ENCOUNTER — Encounter (HOSPITAL_BASED_OUTPATIENT_CLINIC_OR_DEPARTMENT_OTHER): Payer: Self-pay | Admitting: Obstetrics and Gynecology

## 2020-05-28 ENCOUNTER — Ambulatory Visit (HOSPITAL_BASED_OUTPATIENT_CLINIC_OR_DEPARTMENT_OTHER)
Admission: RE | Admit: 2020-05-28 | Discharge: 2020-05-28 | Disposition: A | Discharge status: Home / Self Care | Payer: 59 | Attending: Obstetrics and Gynecology | Admitting: Obstetrics and Gynecology

## 2020-05-28 DIAGNOSIS — N801 Endometriosis of ovary: Secondary | ICD-10-CM | POA: Diagnosis not present

## 2020-05-28 DIAGNOSIS — D251 Intramural leiomyoma of uterus: Secondary | ICD-10-CM | POA: Insufficient documentation

## 2020-05-28 DIAGNOSIS — D259 Leiomyoma of uterus, unspecified: Secondary | ICD-10-CM

## 2020-05-28 DIAGNOSIS — G8929 Other chronic pain: Secondary | ICD-10-CM | POA: Insufficient documentation

## 2020-05-28 DIAGNOSIS — N838 Other noninflammatory disorders of ovary, fallopian tube and broad ligament: Secondary | ICD-10-CM

## 2020-05-28 DIAGNOSIS — N841 Polyp of cervix uteri: Secondary | ICD-10-CM | POA: Insufficient documentation

## 2020-05-28 DIAGNOSIS — Z882 Allergy status to sulfonamides status: Secondary | ICD-10-CM | POA: Diagnosis not present

## 2020-05-28 DIAGNOSIS — N946 Dysmenorrhea, unspecified: Secondary | ICD-10-CM | POA: Diagnosis not present

## 2020-05-28 DIAGNOSIS — D069 Carcinoma in situ of cervix, unspecified: Secondary | ICD-10-CM | POA: Insufficient documentation

## 2020-05-28 DIAGNOSIS — Z90710 Acquired absence of both cervix and uterus: Secondary | ICD-10-CM | POA: Diagnosis present

## 2020-05-28 DIAGNOSIS — N939 Abnormal uterine and vaginal bleeding, unspecified: Secondary | ICD-10-CM | POA: Diagnosis not present

## 2020-05-28 DIAGNOSIS — F1120 Opioid dependence, uncomplicated: Secondary | ICD-10-CM | POA: Insufficient documentation

## 2020-05-28 DIAGNOSIS — Z79899 Other long term (current) drug therapy: Secondary | ICD-10-CM | POA: Diagnosis not present

## 2020-05-28 HISTORY — PX: TOTAL LAPAROSCOPIC HYSTERECTOMY WITH SALPINGECTOMY: SHX6742

## 2020-05-28 HISTORY — DX: Acquired absence of both cervix and uterus: Z90.710

## 2020-05-28 HISTORY — PX: CYSTOSCOPY: SHX5120

## 2020-05-28 LAB — POCT PREGNANCY, URINE: Preg Test, Ur: NEGATIVE

## 2020-05-28 LAB — CBC
HCT: 34.7 % — ABNORMAL LOW (ref 36.0–46.0)
Hemoglobin: 10.8 g/dL — ABNORMAL LOW (ref 12.0–15.0)
MCH: 25.7 pg — ABNORMAL LOW (ref 26.0–34.0)
MCHC: 31.1 g/dL (ref 30.0–36.0)
MCV: 82.6 fL (ref 80.0–100.0)
Platelets: 283 10*3/uL (ref 150–400)
RBC: 4.2 MIL/uL (ref 3.87–5.11)
RDW: 17.4 % — ABNORMAL HIGH (ref 11.5–15.5)
WBC: 8.7 10*3/uL (ref 4.0–10.5)
nRBC: 0 % (ref 0.0–0.2)

## 2020-05-28 LAB — TYPE AND SCREEN
ABO/RH(D): O POS
Antibody Screen: NEGATIVE

## 2020-05-28 SURGERY — HYSTERECTOMY, TOTAL, LAPAROSCOPIC, WITH SALPINGECTOMY
Anesthesia: General

## 2020-05-28 MED ORDER — GABAPENTIN 300 MG PO CAPS
300.0000 mg | ORAL_CAPSULE | Freq: Once | ORAL | Status: AC
  Administered 2020-05-28: 300 mg via ORAL

## 2020-05-28 MED ORDER — ROCURONIUM BROMIDE 10 MG/ML (PF) SYRINGE
PREFILLED_SYRINGE | INTRAVENOUS | Status: AC
  Filled 2020-05-28: qty 10

## 2020-05-28 MED ORDER — KETOROLAC TROMETHAMINE 30 MG/ML IJ SOLN
INTRAMUSCULAR | Status: DC | PRN
  Administered 2020-05-28: 30 mg via INTRAVENOUS

## 2020-05-28 MED ORDER — ROCURONIUM BROMIDE 10 MG/ML (PF) SYRINGE
PREFILLED_SYRINGE | INTRAVENOUS | Status: DC | PRN
  Administered 2020-05-28: 10 mg via INTRAVENOUS
  Administered 2020-05-28: 40 mg via INTRAVENOUS

## 2020-05-28 MED ORDER — IBUPROFEN 800 MG PO TABS: 800.0000 mg | ORAL_TABLET | Freq: Three times a day (TID) | ORAL | 0 refills | Status: DC | PRN

## 2020-05-28 MED ORDER — BUPRENORPHINE HCL-NALOXONE HCL 8-2 MG SL FILM
1.0000 | ORAL_FILM | Freq: Two times a day (BID) | SUBLINGUAL | Status: DC
  Administered 2020-05-28: 1 via SUBLINGUAL

## 2020-05-28 MED ORDER — EPHEDRINE SULFATE 50 MG/ML IJ SOLN
INTRAMUSCULAR | Status: DC | PRN
  Administered 2020-05-28: 5 mg via INTRAVENOUS
  Administered 2020-05-28 (×2): 10 mg via INTRAVENOUS

## 2020-05-28 MED ORDER — MIDAZOLAM HCL 5 MG/5ML IJ SOLN
INTRAMUSCULAR | Status: DC | PRN
  Administered 2020-05-28: 2 mg via INTRAVENOUS

## 2020-05-28 MED ORDER — FENTANYL CITRATE (PF) 250 MCG/5ML IJ SOLN
INTRAMUSCULAR | Status: AC
  Filled 2020-05-28: qty 5

## 2020-05-28 MED ORDER — LIDOCAINE 2% (20 MG/ML) 5 ML SYRINGE
INTRAMUSCULAR | Status: DC | PRN
  Administered 2020-05-28: 60 mg via INTRAVENOUS

## 2020-05-28 MED ORDER — ONDANSETRON HCL 4 MG/2ML IJ SOLN
INTRAMUSCULAR | Status: DC | PRN
  Administered 2020-05-28: 4 mg via INTRAVENOUS

## 2020-05-28 MED ORDER — POVIDONE-IODINE 10 % EX SWAB: 2.0000 "application " | Freq: Once | CUTANEOUS | Status: DC

## 2020-05-28 MED ORDER — DEXAMETHASONE SODIUM PHOSPHATE 10 MG/ML IJ SOLN
INTRAMUSCULAR | Status: AC
  Filled 2020-05-28: qty 1

## 2020-05-28 MED ORDER — KETOROLAC TROMETHAMINE 30 MG/ML IJ SOLN: 30.0000 mg | Freq: Once | INTRAMUSCULAR | Status: DC

## 2020-05-28 MED ORDER — ACETAMINOPHEN 500 MG PO TABS
1000.0000 mg | ORAL_TABLET | Freq: Four times a day (QID) | ORAL | 0 refills | Status: DC
Start: 2020-05-28 — End: 2020-12-09

## 2020-05-28 MED ORDER — FENTANYL CITRATE (PF) 100 MCG/2ML IJ SOLN
INTRAMUSCULAR | Status: AC
  Filled 2020-05-28: qty 2

## 2020-05-28 MED ORDER — KCL IN DEXTROSE-NACL 20-5-0.45 MEQ/L-%-% IV SOLN
INTRAVENOUS | Status: DC
  Filled 2020-05-28: qty 1000

## 2020-05-28 MED ORDER — DOCUSATE SODIUM 100 MG PO CAPS
100.0000 mg | ORAL_CAPSULE | Freq: Two times a day (BID) | ORAL | 0 refills | Status: DC
Start: 2020-05-28 — End: 2020-06-25

## 2020-05-28 MED ORDER — KETAMINE HCL 10 MG/ML IJ SOLN
INTRAMUSCULAR | Status: DC | PRN
  Administered 2020-05-28: 30 mg via INTRAVENOUS

## 2020-05-28 MED ORDER — SODIUM CHLORIDE 0.9 % IV SOLN
2.0000 g | INTRAVENOUS | Status: AC
  Administered 2020-05-28: 2 g via INTRAVENOUS

## 2020-05-28 MED ORDER — ORAL CARE MOUTH RINSE: 15.0000 mL | Freq: Once | OROMUCOSAL | Status: DC

## 2020-05-28 MED ORDER — IPRATROPIUM-ALBUTEROL 0.5-2.5 (3) MG/3ML IN SOLN
RESPIRATORY_TRACT | Status: AC
  Filled 2020-05-28: qty 3

## 2020-05-28 MED ORDER — SUGAMMADEX SODIUM 200 MG/2ML IV SOLN
INTRAVENOUS | Status: DC | PRN
  Administered 2020-05-28: 100 mg via INTRAVENOUS

## 2020-05-28 MED ORDER — ONDANSETRON HCL 4 MG/2ML IJ SOLN
INTRAMUSCULAR | Status: AC
  Filled 2020-05-28: qty 2

## 2020-05-28 MED ORDER — LIDOCAINE HCL (PF) 2 % IJ SOLN
INTRAMUSCULAR | Status: DC | PRN
  Administered 2020-05-28: 1.5 mg/kg/h via INTRADERMAL

## 2020-05-28 MED ORDER — ROPIVACAINE HCL 5 MG/ML IJ SOLN
INTRAMUSCULAR | Status: DC | PRN
  Administered 2020-05-28: 30 mL via EPIDURAL

## 2020-05-28 MED ORDER — MIDAZOLAM HCL 2 MG/2ML IJ SOLN
INTRAMUSCULAR | Status: AC
  Filled 2020-05-28: qty 2

## 2020-05-28 MED ORDER — KETOROLAC TROMETHAMINE 15 MG/ML IJ SOLN
15.0000 mg | Freq: Four times a day (QID) | INTRAMUSCULAR | Status: DC
  Administered 2020-05-28: 15 mg via INTRAVENOUS

## 2020-05-28 MED ORDER — IPRATROPIUM-ALBUTEROL 0.5-2.5 (3) MG/3ML IN SOLN
3.0000 mL | Freq: Once | RESPIRATORY_TRACT | Status: AC
  Administered 2020-05-28: 3 mL via RESPIRATORY_TRACT

## 2020-05-28 MED ORDER — CHLORHEXIDINE GLUCONATE 0.12 % MT SOLN: 15.0000 mL | Freq: Once | OROMUCOSAL | Status: DC

## 2020-05-28 MED ORDER — ACETAMINOPHEN 500 MG PO TABS
ORAL_TABLET | ORAL | Status: AC
  Filled 2020-05-28: qty 2

## 2020-05-28 MED ORDER — PHENYLEPHRINE 40 MCG/ML (10ML) SYRINGE FOR IV PUSH (FOR BLOOD PRESSURE SUPPORT)
PREFILLED_SYRINGE | INTRAVENOUS | Status: AC
  Filled 2020-05-28: qty 10

## 2020-05-28 MED ORDER — PANTOPRAZOLE SODIUM 40 MG PO TBEC
DELAYED_RELEASE_TABLET | ORAL | Status: AC
  Filled 2020-05-28: qty 1

## 2020-05-28 MED ORDER — LACTATED RINGERS IV SOLN: INTRAVENOUS | Status: DC

## 2020-05-28 MED ORDER — FLUOXETINE HCL 20 MG PO CAPS
20.0000 mg | ORAL_CAPSULE | Freq: Every day | ORAL | Status: DC
  Administered 2020-05-28: 20 mg via ORAL
  Filled 2020-05-28 (×2): qty 1

## 2020-05-28 MED ORDER — ENOXAPARIN SODIUM 40 MG/0.4ML ~~LOC~~ SOLN
SUBCUTANEOUS | Status: AC
  Filled 2020-05-28: qty 0.4

## 2020-05-28 MED ORDER — ONDANSETRON HCL 4 MG PO TABS: 4.0000 mg | ORAL_TABLET | Freq: Three times a day (TID) | ORAL | Status: DC | PRN

## 2020-05-28 MED ORDER — DEXMEDETOMIDINE (PRECEDEX) IN NS 20 MCG/5ML (4 MCG/ML) IV SYRINGE
PREFILLED_SYRINGE | INTRAVENOUS | Status: DC | PRN
  Administered 2020-05-28: 4 ug via INTRAVENOUS
  Administered 2020-05-28: 12 ug via INTRAVENOUS
  Administered 2020-05-28: 4 ug via INTRAVENOUS

## 2020-05-28 MED ORDER — SIMETHICONE 80 MG PO CHEW
80.0000 mg | CHEWABLE_TABLET | Freq: Four times a day (QID) | ORAL | Status: DC | PRN
  Administered 2020-05-28 (×2): 80 mg via ORAL

## 2020-05-28 MED ORDER — GABAPENTIN 100 MG PO CAPS
100.0000 mg | ORAL_CAPSULE | Freq: Three times a day (TID) | ORAL | Status: DC
  Administered 2020-05-28: 100 mg via ORAL

## 2020-05-28 MED ORDER — 0.9 % SODIUM CHLORIDE (POUR BTL) OPTIME
TOPICAL | Status: DC | PRN
  Administered 2020-05-28: 500 mL

## 2020-05-28 MED ORDER — PANTOPRAZOLE SODIUM 40 MG PO TBEC
40.0000 mg | DELAYED_RELEASE_TABLET | Freq: Every day | ORAL | Status: DC
  Administered 2020-05-28: 40 mg via ORAL

## 2020-05-28 MED ORDER — ENOXAPARIN SODIUM 30 MG/0.3ML ~~LOC~~ SOLN
30.0000 mg | Freq: Once | SUBCUTANEOUS | Status: AC
  Administered 2020-05-28: 30 mg via SUBCUTANEOUS
  Filled 2020-05-28: qty 0.3

## 2020-05-28 MED ORDER — KETAMINE HCL 10 MG/ML IJ SOLN
INTRAMUSCULAR | Status: AC
  Filled 2020-05-28: qty 1

## 2020-05-28 MED ORDER — PROPOFOL 10 MG/ML IV BOLUS
INTRAVENOUS | Status: DC | PRN
  Administered 2020-05-28: 80 mg via INTRAVENOUS

## 2020-05-28 MED ORDER — SODIUM CHLORIDE 0.9 % IR SOLN
Status: DC | PRN
  Administered 2020-05-28: 3000 mL

## 2020-05-28 MED ORDER — ACETAMINOPHEN 500 MG PO TABS
1000.0000 mg | ORAL_TABLET | Freq: Once | ORAL | Status: AC
  Administered 2020-05-28: 1000 mg via ORAL

## 2020-05-28 MED ORDER — SIMETHICONE 80 MG PO CHEW
CHEWABLE_TABLET | ORAL | Status: AC
  Filled 2020-05-28: qty 1

## 2020-05-28 MED ORDER — PROPOFOL 10 MG/ML IV BOLUS
INTRAVENOUS | Status: AC
  Filled 2020-05-28: qty 40

## 2020-05-28 MED ORDER — SODIUM CHLORIDE (PF) 0.9 % IJ SOLN
INTRAMUSCULAR | Status: DC | PRN
  Administered 2020-05-28: 30 mL

## 2020-05-28 MED ORDER — GABAPENTIN 300 MG PO CAPS
ORAL_CAPSULE | ORAL | Status: AC
  Filled 2020-05-28: qty 1

## 2020-05-28 MED ORDER — DEXMEDETOMIDINE (PRECEDEX) IN NS 20 MCG/5ML (4 MCG/ML) IV SYRINGE
PREFILLED_SYRINGE | INTRAVENOUS | Status: AC
  Filled 2020-05-28: qty 5

## 2020-05-28 MED ORDER — KETOROLAC TROMETHAMINE 30 MG/ML IJ SOLN
INTRAMUSCULAR | Status: AC
  Filled 2020-05-28: qty 1

## 2020-05-28 MED ORDER — DOCUSATE SODIUM 100 MG PO CAPS
100.0000 mg | ORAL_CAPSULE | Freq: Two times a day (BID) | ORAL | Status: DC
  Administered 2020-05-28: 100 mg via ORAL

## 2020-05-28 MED ORDER — BUPIVACAINE HCL (PF) 0.25 % IJ SOLN
INTRAMUSCULAR | Status: DC | PRN
  Administered 2020-05-28: 9 mL

## 2020-05-28 MED ORDER — MENTHOL 3 MG MT LOZG: 1.0000 | LOZENGE | OROMUCOSAL | Status: DC | PRN

## 2020-05-28 MED ORDER — KETOROLAC TROMETHAMINE 15 MG/ML IJ SOLN
INTRAMUSCULAR | Status: AC
  Filled 2020-05-28: qty 1

## 2020-05-28 MED ORDER — LIDOCAINE HCL 2 % IJ SOLN
INTRAMUSCULAR | Status: AC
  Filled 2020-05-28: qty 20

## 2020-05-28 MED ORDER — SODIUM CHLORIDE 0.9 % IV SOLN
INTRAVENOUS | Status: AC
  Filled 2020-05-28: qty 2

## 2020-05-28 MED ORDER — DEXAMETHASONE SODIUM PHOSPHATE 10 MG/ML IJ SOLN
INTRAMUSCULAR | Status: DC | PRN
  Administered 2020-05-28: 10 mg via INTRAVENOUS

## 2020-05-28 MED ORDER — GABAPENTIN 100 MG PO CAPS
ORAL_CAPSULE | ORAL | Status: AC
  Filled 2020-05-28: qty 1

## 2020-05-28 MED ORDER — ACETAMINOPHEN 500 MG PO TABS
1000.0000 mg | ORAL_TABLET | Freq: Four times a day (QID) | ORAL | Status: DC
  Administered 2020-05-28: 1000 mg via ORAL

## 2020-05-28 MED ORDER — ENOXAPARIN SODIUM 40 MG/0.4ML ~~LOC~~ SOLN: 40.0000 mg | SUBCUTANEOUS | Status: DC

## 2020-05-28 MED ORDER — FENTANYL CITRATE (PF) 100 MCG/2ML IJ SOLN
25.0000 ug | INTRAMUSCULAR | Status: DC | PRN
  Administered 2020-05-28: 25 ug via INTRAVENOUS
  Administered 2020-05-28: 50 ug via INTRAVENOUS
  Administered 2020-05-28: 25 ug via INTRAVENOUS

## 2020-05-28 MED ORDER — DOCUSATE SODIUM 100 MG PO CAPS
ORAL_CAPSULE | ORAL | Status: AC
  Filled 2020-05-28: qty 1

## 2020-05-28 MED ORDER — HYDROXYZINE HCL 25 MG PO TABS
25.0000 mg | ORAL_TABLET | Freq: Three times a day (TID) | ORAL | Status: DC | PRN
  Filled 2020-05-28: qty 2

## 2020-05-28 SURGICAL SUPPLY — 69 items
ADH SKN CLS APL DERMABOND .7 (GAUZE/BANDAGES/DRESSINGS) ×1
APL SRG 38 LTWT LNG FL B (MISCELLANEOUS) ×1
APPLICATOR ARISTA FLEXITIP XL (MISCELLANEOUS) ×2 IMPLANT
BLADE SURG 10 STRL SS (BLADE) IMPLANT
CABLE HIGH FREQUENCY MONO STRZ (ELECTRODE) IMPLANT
CANISTER SUCT 3000ML PPV (MISCELLANEOUS) ×3 IMPLANT
CELL SAVER LIPIGURD (MISCELLANEOUS) IMPLANT
COVER BACK TABLE 60X90IN (DRAPES) ×2 IMPLANT
COVER MAYO STAND STRL (DRAPES) ×3 IMPLANT
COVER WAND RF STERILE (DRAPES) ×3 IMPLANT
DECANTER SPIKE VIAL GLASS SM (MISCELLANEOUS) ×7 IMPLANT
DERMABOND ADVANCED (GAUZE/BANDAGES/DRESSINGS) ×2
DERMABOND ADVANCED .7 DNX12 (GAUZE/BANDAGES/DRESSINGS) ×1 IMPLANT
DEVICE RETRIEVAL ALEXIS 14 (MISCELLANEOUS) IMPLANT
DRSG COVADERM PLUS 2X2 (GAUZE/BANDAGES/DRESSINGS) ×9 IMPLANT
DRSG OPSITE POSTOP 3X4 (GAUZE/BANDAGES/DRESSINGS) ×3 IMPLANT
DURAPREP 26ML APPLICATOR (WOUND CARE) ×3 IMPLANT
EXTRT SYSTEM ALEXIS 14CM (MISCELLANEOUS)
EXTRT SYSTEM ALEXIS 17CM (MISCELLANEOUS)
GAUZE 4X4 16PLY RFD (DISPOSABLE) ×5 IMPLANT
GLOVE BIO SURGEON STRL SZ 6.5 (GLOVE) ×4 IMPLANT
GLOVE BIO SURGEONS STRL SZ 6.5 (GLOVE) ×2
GLOVE BIOGEL PI IND STRL 7.0 (GLOVE) ×4 IMPLANT
GLOVE BIOGEL PI INDICATOR 7.0 (GLOVE) ×8
GOWN STRL REUS W/TWL LRG LVL3 (GOWN DISPOSABLE) ×12 IMPLANT
HARMONIC RUM II 2.5CM SILVER (DISPOSABLE)
HARMONIC RUM II 3.0CM SILVER (DISPOSABLE) ×3
HARMONIC RUM II 3.5CM SILVER (DISPOSABLE)
HARMONIC RUM II 4.0CM SILVER (DISPOSABLE)
HEMOSTAT ARISTA ABSORB 3G PWDR (HEMOSTASIS) ×3 IMPLANT
KIT TURNOVER CYSTO (KITS) ×3 IMPLANT
LIGASURE VESSEL 5MM BLUNT TIP (ELECTROSURGICAL) ×3 IMPLANT
NEEDLE INSUFFLATION 120MM (ENDOMECHANICALS) ×3 IMPLANT
PACK LAPAROSCOPY BASIN (CUSTOM PROCEDURE TRAY) ×3 IMPLANT
PACK TRENDGUARD 450 HYBRID PRO (MISCELLANEOUS) IMPLANT
POUCH LAPAROSCOPIC INSTRUMENT (MISCELLANEOUS) ×3 IMPLANT
PROTECTOR NERVE ULNAR (MISCELLANEOUS) ×6 IMPLANT
RETRACTOR WOUND ALXS 19CM XSML (INSTRUMENTS) IMPLANT
RTRCTR WOUND ALEXIS 19CM XSML (INSTRUMENTS)
SCALPEL HRMNC RUM II 2.5 SILVR (DISPOSABLE) IMPLANT
SCALPEL HRMNC RUM II 3.0 SILVR (DISPOSABLE) IMPLANT
SCALPEL HRMNC RUM II 3.5 SILVR (DISPOSABLE) IMPLANT
SCALPEL HRMNC RUM II 4.0 SILVR (DISPOSABLE) IMPLANT
SCISSORS LAP 5X35 DISP (ENDOMECHANICALS) IMPLANT
SET IRRIG Y TYPE TUR BLADDER L (SET/KITS/TRAYS/PACK) ×3 IMPLANT
SET SUCTION IRRIG HYDROSURG (IRRIGATION / IRRIGATOR) ×5 IMPLANT
SET TRI-LUMEN FLTR TB AIRSEAL (TUBING) ×3 IMPLANT
SHEARS HARMONIC ACE PLUS 36CM (ENDOMECHANICALS) ×3 IMPLANT
SUT VIC AB 0 CT1 36 (SUTURE) ×3 IMPLANT
SUT VIC AB 4-0 PS2 18 (SUTURE) ×3 IMPLANT
SUT VICRYL 0 UR6 27IN ABS (SUTURE) IMPLANT
SUT VLOC 180 0 9IN  GS21 (SUTURE) ×3
SUT VLOC 180 0 9IN GS21 (SUTURE) IMPLANT
SYR 50ML LL SCALE MARK (SYRINGE) ×6 IMPLANT
SYSTEM CONTND EXTRCTN KII BLLN (MISCELLANEOUS) IMPLANT
TIP RUMI ORANGE 6.7MMX12CM (TIP) IMPLANT
TIP UTERINE 5.1X6CM LAV DISP (MISCELLANEOUS) ×2 IMPLANT
TIP UTERINE 6.7X10CM GRN DISP (MISCELLANEOUS) IMPLANT
TIP UTERINE 6.7X6CM WHT DISP (MISCELLANEOUS) IMPLANT
TIP UTERINE 6.7X8CM BLUE DISP (MISCELLANEOUS) IMPLANT
TOWEL OR 17X26 10 PK STRL BLUE (TOWEL DISPOSABLE) ×3 IMPLANT
TRAY FOLEY W/BAG SLVR 14FR LF (SET/KITS/TRAYS/PACK) ×3 IMPLANT
TRENDGUARD 450 HYBRID PRO PACK (MISCELLANEOUS) ×3
TROCAR ADV FIXATION 5X100MM (TROCAR) ×5 IMPLANT
TROCAR BLADELESS OPT 5 100 (ENDOMECHANICALS) ×3 IMPLANT
TROCAR PORT AIRSEAL 5X120 (TROCAR) ×3 IMPLANT
TROCAR XCEL NON BLADE 8MM B8LT (ENDOMECHANICALS) ×3 IMPLANT
TUBING TUR DISP (UROLOGICAL SUPPLIES) ×2 IMPLANT
WARMER LAPAROSCOPE (MISCELLANEOUS) ×3 IMPLANT

## 2020-05-28 NOTE — Transfer of Care (Signed)
Immediate Anesthesia Transfer of Care Note  Patient: Rachel Bennett  Procedure(s) Performed: TOTAL LAPAROSCOPIC HYSTERECTOMY WITH SALPINGECTOMY (N/A ) CYSTOSCOPY (N/A )  Patient Location: PACU  Anesthesia Type:General  Level of Consciousness: sedated  Airway & Oxygen Therapy: Patient Spontanous Breathing and Patient connected to nasal cannula oxygen  Post-op Assessment: Report given to RN  Post vital signs: Reviewed and stable  Last Vitals:  Vitals Value Taken Time  BP 108/66 05/28/20 0949  Temp    Pulse 76 05/28/20 0952  Resp 11 05/28/20 0952  SpO2 100 % 05/28/20 0952  Vitals shown include unvalidated device data.  Last Pain:  Vitals:   05/28/20 0649  TempSrc: Oral  PainSc: 4       Patients Stated Pain Goal: 6 (19/62/22 9798)  Complications: No complications documented.

## 2020-05-28 NOTE — Anesthesia Postprocedure Evaluation (Signed)
Anesthesia Post Note  Patient: LILIANN FILE  Procedure(s) Performed: TOTAL LAPAROSCOPIC HYSTERECTOMY WITH SALPINGECTOMY (N/A ) CYSTOSCOPY (N/A )     Patient location during evaluation: PACU Anesthesia Type: General Level of consciousness: awake and alert Pain management: pain level controlled Vital Signs Assessment: post-procedure vital signs reviewed and stable Respiratory status: spontaneous breathing, nonlabored ventilation, respiratory function stable and patient connected to nasal cannula oxygen Cardiovascular status: blood pressure returned to baseline and stable Postop Assessment: no apparent nausea or vomiting Anesthetic complications: no   No complications documented.  Last Vitals:  Vitals:   05/28/20 1115 05/28/20 1145  BP: 103/66 106/66  Pulse: 73 77  Resp: 14 12  Temp:  36.5 C  SpO2: 100% 96%    Last Pain:  Vitals:   05/28/20 1130  TempSrc:   PainSc: 8                  Tiajuana Amass

## 2020-05-28 NOTE — Interval H&P Note (Signed)
History and Physical Interval Note:  05/28/2020 7:19 AM  Rachel Bennett  has presented today for surgery, with the diagnosis of Abnormal uterine bleeding, severe dysmenorrhea.  The various methods of treatment have been discussed with the patient and family. After consideration of risks, benefits and other options for treatment, the patient has consented to  Procedure(s): TOTAL LAPAROSCOPIC HYSTERECTOMY WITH SALPINGECTOMY (N/A) CYSTOSCOPY (N/A) POSSIBLE ABLATION OF ENDOMETRIOSIS (N/A) as a surgical intervention.  The patient's history has been reviewed, patient examined, no change in status, stable for surgery.  I have reviewed the patient's chart and labs.  Questions were answered to the patient's satisfaction.   She has had cardiac clearance for an abnormal EKG.    Salvadore Dom

## 2020-05-28 NOTE — Op Note (Signed)
Preoperative Diagnosis: abnormal uterine bleeding, severe dysmenorrhea, cervical dysplasia  Postoperative Diagnosis: abnormal uterine bleeding, severe dysmenorrhea, endometriosis, cervical dysplasia  Procedure:  Total Laparoscopic Hysterectomy, ablation of endometriosis, bilateral salpingectomies and cystoscopy  Surgeon: Dr Sumner Boast  Assistant: Dr Josefa Half, an MD assistant was necessary for tissue manipulation, retraction and positioning due to the complexity of the case and hospital policies   Anesthesia: General  EBL: 20  Fluids: 700  Urine output: 161 cc  Complications: none  Indications for surgery: The patient is a 39 year old female, who presented with abnormal uterine bleeding a thickened endometrium and severe dysmenorrhea. A pelvic ultrasound showed a normal sized uterus with an endometrial mass suspicious for an endometrial polyp. Pap smear returned with HSIL, colposcopy with CIN II-III. We discussed hysteroscopy, D&C with LEEP and mirena IUD insertion. She declined, wanting definitive surgery. 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 Endometrial biopsy with secretory endometrium.  The patient is aware of the risks and complications involved with the surgery and consent was obtained prior to the procedure.  Findings: EUA: Normal sized uterus, no adnexal masses. Laparoscopy: superficial endometriosis was noted on each ovary, otherwise normal uterus and bilateral adnexa. Normal liver edge, normal appendix.   Procedure: The patient was taken to the operating room with an IV in placed, preoperative lovenox and antibiotics had been administered. She was placed in the dorsal lithotomy position. General anesthesia was administered. She was prepped and draped in the usual sterile fashion for an abdominal, vaginal surgery. A rumi uterine manipulator was placed, using a # 3 cup and a 6 cm extender. A foley catheter was placed.    The  umbilicus was everted, injected with 0.25% marcaine and incised with a # 11 blade. 2 towel clips were used to elevated the umbilicus and a veress needle was placed into the abdominal cavity. The abdominal cavity was insufflated with CO2, with normal intraabdominal pressures. After adequate pneumo-insufflation the veress needle was removed and the 5 mm laparoscope was placed into the abdominal cavity using the opti-view trocar. The patient was placed in trendelenburg and the abdominal pelvic cavity was inspected. 3 more trocars were placed: 1 in each lower quadrant approximately 3 cm medial to and superior to the anterior superior iliac spine and one in the midline approximately 6 cm above the pubic symphysis in the midline. These areas were injected with 0.25% marcaine, incised with a #11 blade and all trocars were inserted with direct visualization with the laparoscope. A # 5 airseal trocar was placed in the RLQ, a 5 mm trocar in the LLQ and a #8 trocar in the midline. The abdominal pelvic cavity was again inspected. A mixture of 30 cc of Robivacaine and 30 cc of NS was place in the pelvic cavity. The ureters were identified bilaterally.  The endometriosis on the ovaries was ablated with the harmonic scalpel. The left tube was elevated from the pelvic sidewall, cauterized and cut with the ligasure device. The mesosalpinx was cauterized and cut with the ligasure device. The tube was separated from the uterus using the ligasure device and removed through the midline trocar. The left uterine ovarian ligament was cauterized and cut with the ligasure device. The left round ligament was cauterized and cut with the ligasure device and the anterior and posterior leafs of the broad ligament were taken down with the ligasure device. The harmonic scalpel was then used to take down the bladder flap and  skeltonize the vessels. The left uterine vessels were then clamped, cauterized and ligated with the ligasure device.  Hemostasis was excellent. The same procedure was repeated on the right.   Using the rumi manipulator the uterus was pushed up in the pelvic cavity and the harmonic scalpel was used to separate the cervix from the vagina using the harmonic energy. The uterus was  removed vaginally at this time. An occluder was placed in the vagina to maintain pneumoperitoneum. The vaginal cuff was then closed with a 0 V-lock suture. Hemostasis was excellent. The abdominal pelvic cavity was irrigated and suctioned dry. Pressure was released and hemostasis remained excellent.   The abdominal cavity was desufflated and the trocars were removed. The skin was closed with subcuticular stiches of 4-0 vicryl and dermabond was placed over the incisions.  The foley catheter was removed and cystoscopy was performed using a 70 degree scope. Both ureters expelled urine, no bladder abnormalities were noted. The bladder was allowed to drain and the cystoscope was removed.   The patient's abdomen and perineum were cleansed and she was taken out of the dorsal lithotomy position. Upon awakening she was extubated and taken to the recovery room in stable condition. The sponge and instrument counts were correct.

## 2020-05-28 NOTE — Anesthesia Procedure Notes (Signed)
Procedure Name: Intubation Date/Time: 05/28/2020 7:44 AM Performed by: Bonney Aid, CRNA Pre-anesthesia Checklist: Patient identified, Emergency Drugs available, Suction available and Patient being monitored Patient Re-evaluated:Patient Re-evaluated prior to induction Oxygen Delivery Method: Circle system utilized Preoxygenation: Pre-oxygenation with 100% oxygen Induction Type: IV induction Ventilation: Mask ventilation without difficulty Laryngoscope Size: Mac and 3 Grade View: Grade I Tube type: Oral Tube size: 7.0 mm Number of attempts: 1 Airway Equipment and Method: Stylet Placement Confirmation: ETT inserted through vocal cords under direct vision,  positive ETCO2 and breath sounds checked- equal and bilateral Secured at: 21 cm Tube secured with: Tape Dental Injury: Teeth and Oropharynx as per pre-operative assessment

## 2020-05-28 NOTE — Discharge Summary (Signed)
Physician Discharge Summary  Patient ID: Rachel Bennett MRN: [id number] DOB/AGE: 1981/01/31 39 y.o.  Admit date: 05/28/2020 Discharge date: 05/28/2020  Admission Diagnoses: abnormal uterine bleeding, severe dysmenorrhea, cervical dysplasia  Discharge Diagnoses:  Active Problems:   S/P laparoscopic hysterectomy   Discharged Condition: good  Hospital Course: uncomplicated   Consults: None  Significant Diagnostic Studies: labs:  Lab Results  Component Value Date   WBC 8.7 05/28/2020   HGB 10.8 (L) 05/28/2020   HCT 34.7 (L) 05/28/2020   MCV 82.6 05/28/2020   PLT 283 05/28/2020     Treatments: surgery: Total laparoscopic hysterectomy, bilateral salpingectomies, ablation of endometriosis, cystoscopy  Discharge Exam: Blood pressure 100/69, pulse 74, temperature 97.7 F (36.5 C), resp. rate 16, height 5' 5.5" (1.664 m), weight 46 kg, last menstrual period 05/06/2020, SpO2 95 %. General appearance: alert, cooperative and no distress Resp: clear to auscultation bilaterally Cardio: S1, S2 normal GI: soft, non-tender; bowel sounds normal; no masses,  no organomegaly Extremities: extremities normal, atraumatic, no cyanosis or edema Incision/Wound: clean, dry and intact without erythema.   Disposition: Discharge disposition: 01-Home or Self Care       Discharge Instructions    Call MD for:   Complete by: As directed    Heavy vaginal bleeding   Call MD for:  difficulty breathing, headache or visual disturbances   Complete by: As directed    Call MD for:  extreme fatigue   Complete by: As directed    Call MD for:  hives   Complete by: As directed    Call MD for:  persistant dizziness or light-headedness   Complete by: As directed    Call MD for:  persistant nausea and vomiting   Complete by: As directed    Call MD for:  redness, tenderness, or signs of infection (pain, swelling, redness, odor or green/yellow discharge around incision site)   Complete by: As directed     Call MD for:  severe uncontrolled pain   Complete by: As directed    Call MD for:  temperature >100.4   Complete by: As directed    Diet general   Complete by: As directed    Driving Restrictions   Complete by: As directed    No driving until you can slam on the brakes of the car   Increase activity slowly   Complete by: As directed    May shower / Bathe   Complete by: As directed    Can shower 24 hours after surgery, no tub bath for at least 2 weeks.   No dressing needed   Complete by: As directed    Sexual Activity Restrictions   Complete by: As directed    No intercourse x 12 weeks        Signed: Salvadore Dom 05/28/2020, 5:47 PM

## 2020-05-29 ENCOUNTER — Encounter (HOSPITAL_BASED_OUTPATIENT_CLINIC_OR_DEPARTMENT_OTHER): Payer: Self-pay | Admitting: Obstetrics and Gynecology

## 2020-05-29 LAB — SURGICAL PATHOLOGY

## 2020-06-04 ENCOUNTER — Ambulatory Visit: Payer: 59 | Admitting: Obstetrics and Gynecology

## 2020-06-04 ENCOUNTER — Telehealth: Payer: Self-pay | Admitting: Physician Assistant

## 2020-06-04 ENCOUNTER — Other Ambulatory Visit: Payer: Self-pay | Admitting: Physician Assistant

## 2020-06-04 DIAGNOSIS — F32A Depression, unspecified: Secondary | ICD-10-CM

## 2020-06-04 MED ORDER — HYDROXYZINE HCL 25 MG PO TABS: ORAL_TABLET | ORAL | 1 refills | Status: DC

## 2020-06-04 NOTE — Telephone Encounter (Signed)
We have not prescribed these medications for the patient previously.  Please review and refill if appropriate.  T. Anaaya Fuster, CMA  

## 2020-06-04 NOTE — Telephone Encounter (Signed)
Patient needs refill on Hydroxyzine.  It needs to be sent to [facility].   hydrOXYzine (ATARAX/VISTARIL) 25 MG tablet [[id number]]   Order Details Dose, Route, Frequency: As Directed  Dispense Quantity: 60 tablet Refills: 1       Sig: Take 1-2 tablets by mouth every 8 hours as needed for severe anxiety.  Patient taking differently: Take 25-50 mg by mouth every 8 (eight) hours as needed (severe anxiety).       Start Date: 05/14/20 End Date: --  Written Date: 05/14/20 Expiration Date: 05/14/21    Diagnosis Association: Anxiety and depression (F41.9 , F32.A)  Original Order:  hydrOXYzine (ATARAX/VISTARIL) 25 MG tablet [[id number]]     Order Questions  Question Answer Comment  Supervising Provider Beatrice Lecher D

## 2020-06-05 ENCOUNTER — Other Ambulatory Visit: Payer: Self-pay

## 2020-06-05 ENCOUNTER — Ambulatory Visit (INDEPENDENT_AMBULATORY_CARE_PROVIDER_SITE_OTHER): Payer: 59 | Admitting: Obstetrics and Gynecology

## 2020-06-05 ENCOUNTER — Encounter: Payer: Self-pay | Admitting: Obstetrics and Gynecology

## 2020-06-05 VITALS — BP 118/68 | HR 145 | Temp 98.8°F | Ht 65.5 in | Wt 98.0 lb

## 2020-06-05 DIAGNOSIS — G8918 Other acute postprocedural pain: Secondary | ICD-10-CM

## 2020-06-05 DIAGNOSIS — R000 Tachycardia, unspecified: Secondary | ICD-10-CM

## 2020-06-05 DIAGNOSIS — R3911 Hesitancy of micturition: Secondary | ICD-10-CM

## 2020-06-05 NOTE — Progress Notes (Signed)
GYNECOLOGY  VISIT   HPI: 40 y.o.   Married White or Caucasian Not Hispanic or Latino  female   [phone] with Patient's last menstrual period was 05/06/2020.   here for 1 week post op from hysterectomy. Patient states that she thought she was supposed to be on an antibiotic after surgery but she did not get one called in for her.   7/10 pain level at the worst.  She is on suboxone 8mg /4mg  one time a day and 4mg /1mg  one time a day.  She is taking ibuprofen and tylenol.  The patient is one week s/p TLH/BS, ablation of endometriosis and cystoscopy. Pathology with CIN III, otherwise negative. Her pain is bad, but improving from what it was. She is eating and drinking, slight nausea (improved since prior to surgery).  She has had 2-3 BM's in the last week (normal for her). She doesn't feel she is emptying her bladder, feels her urine stream is slow. Needs to sit on the toilet a while to finish voiding. No vaginal bleeding.   GYNECOLOGIC HISTORY: Patient's last menstrual period was 05/06/2020. Contraception: hysterectomy  Menopausal hormone therapy: none         OB History    Gravida  2   Para  2   Term  2   Preterm      AB      Living  2     SAB      IAB      Ectopic      Multiple      Live Births  2              Patient Active Problem List   Diagnosis Date Noted  . S/P laparoscopic hysterectomy 05/28/2020  . Preoperative cardiovascular examination 05/20/2020  . Mixed dyslipidemia 05/20/2020  . Cigarette smoker 05/20/2020  . Abnormal EKG 05/20/2020  . Ovarian cyst   . Endometriosis   . Dysmenorrhea   . Depression   . Chronic pain   . Anxiety   . Anemia   . Prediabetes   . Elevated lipids   . 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 Encounter for insertion of mirena IUD 05/19/2017  . Atypical nevi 05/19/2017  . Screening examination for STD (sexually transmitted disease) 05/19/2017  . Tobacco use 05/19/2017  . High serum low density lipoprotein (LDL)  cholesterol 05/19/2017  . Healthcare maintenance 05/19/2017  . Vitamin D deficiency 05/19/2017  . Iron deficiency anemia 05/18/2017  . Chronic bilateral low back pain 03/05/2017  . Dysphagia, idiopathic 03/05/2017  . Opiate withdrawal ([place]) 03/04/2017    Past Medical History:  Diagnosis Date  . Anemia   . Anxiety   . Atypical nevi 05/19/2017  . Chronic bilateral low back pain 03/05/2017  . Chronic pain    back, hips and pelvis  . 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 Depression   . Dysmenorrhea   . Dysphagia, idiopathic 03/05/2017  . Elevated lipids   . Encounter for insertion of mirena IUD 05/19/2017  . Endometriosis   . Healthcare maintenance 05/19/2017  . High serum low density lipoprotein (LDL) cholesterol 05/19/2017  . Iron deficiency anemia 05/18/2017  . Opiate withdrawal ([place]) 03/04/2017  . Ovarian cyst   . Pre-diabetes   . Prediabetes   . Screening examination for STD (sexually transmitted disease) 05/19/2017  . Tobacco use 05/19/2017  . Vitamin D deficiency 05/19/2017    Past Surgical History:  Procedure Laterality Date  . CYSTOSCOPY N/A 05/28/2020   Procedure: CYSTOSCOPY;  Surgeon: Salvadore Dom, MD;  Location: [facility];  Service: Gynecology;  Laterality: N/A;  . LEEP    . LUMBAR DISC SURGERY    . NASAL SEPTUM SURGERY    . TOTAL LAPAROSCOPIC HYSTERECTOMY WITH SALPINGECTOMY N/A 05/28/2020   Procedure: TOTAL LAPAROSCOPIC HYSTERECTOMY WITH SALPINGECTOMY;  Surgeon: Salvadore Dom, MD;  Location: [facility];  Service: Gynecology;  Laterality: N/A;    Current Outpatient Medications  Medication Sig Dispense Refill  . acetaminophen (TYLENOL) 500 MG tablet Take 2 tablets (1,000 mg total) by mouth every 6 (six) hours. 30 tablet 0  . buprenorphine-naloxone (SUBOXONE) 2-0.5 mg SUBL SL tablet Place 2 tablets under the tongue daily.    Marland Kitchen docusate sodium (COLACE) 100 MG capsule Take 1 capsule (100 mg total) by mouth 2 (two)  times daily. 10 capsule 0  . FLUoxetine (PROZAC) 10 MG capsule Take 20 mg by mouth daily with lunch.     . hydrOXYzine (ATARAX/VISTARIL) 25 MG tablet Take 1-2 tablets by mouth every 8 hours as needed for severe anxiety. 90 tablet 1  . ibuprofen (ADVIL) 800 MG tablet Take 1 tablet (800 mg total) by mouth every 8 (eight) hours as needed. 30 tablet 0  . ondansetron (ZOFRAN) 4 MG tablet Take 4 mg by mouth every 8 (eight) hours as needed for nausea or vomiting.     . traZODone (DESYREL) 50 MG tablet Take 0.5-1 tablets (25-50 mg total) by mouth at bedtime as needed for sleep. (Patient taking differently: Take 50 mg by mouth at bedtime as needed for sleep.) 30 tablet 2  . tiZANidine (ZANAFLEX) 4 MG tablet Take 4 mg by mouth 3 (three) times daily as needed (spasms.).  (Patient not taking: Reported on 06/05/2020)     No current facility-administered medications for this visit.     ALLERGIES: Sulfa antibiotics  Family History  Problem Relation Age of Onset  . Arthritis Mother   . Anxiety disorder Mother   . Depression Mother   . Diabetes Mother   . Heart disease Mother   . Hyperlipidemia Mother   . Hypertension Mother   . Thyroid disease Mother   . Birth defects Sister   . Migraines Sister   . Healthy Brother   . Healthy Daughter   . Healthy Sister   . Drug abuse Brother   . Healthy Brother   . Healthy Daughter     Social History   Socioeconomic History  . Marital status: Married    Spouse name: Not on file  . Number of children: Not on file  . Years of education: Not on file  . Highest education level: Not on file  Occupational History  . Not on file  Tobacco Use  . Smoking status: Current Every Day Smoker    Packs/day: 1.50    Years: 23.00    Pack years: 34.50    Types: Cigarettes    Last attempt to quit: 1998    Years since quitting: 23.9  . Smokeless tobacco: Never Used  Vaping Use  . Vaping Use: Never used  Substance and Sexual Activity  . Alcohol use: No  . Drug  use: No  . Sexual activity: Yes    Birth control/protection: None  Other Topics Concern  . Not on file  Social History Narrative  . Not on file   Social Determinants of Health   Financial Resource Strain: Not on file  Food Insecurity: Not on file  Transportation Needs: Not on file  Physical Activity: Not on file  Stress: Not on file  Social Connections: Not on file  Intimate Partner Violence: Not on file    Review of Systems  Gastrointestinal: Positive for abdominal pain.  All other systems reviewed and are negative.   PHYSICAL EXAMINATION:    BP 118/68   Pulse (!) 145   Ht 5' 5.5" (1.664 m)   Wt 98 lb (44.5 kg)   LMP 05/06/2020 Comment: spotting   SpO2 97%   BMI 16.06 kg/m     Temp 98.8 General appearance: alert, cooperative and appears stated age Heart: tachycardic in the 130's, regular rhythm Lungs: CTAB Abdomen: soft, mildly tender BLQ, no rebound, no guarding, minimal distention. Incisions: clean, dry and intact without erythema  Pelvic: External genitalia:  no lesions              Urethra:  normal appearing urethra with no masses, tenderness or lesions              Bartholins and Skenes: normal                 Vagina: cuff is not tender, not indurated.               Cervix: absent              Bimanual Exam:  No masses, mildly tender with abdominal palpation.  Patient voided 15 cc, PVR ~5 cc  Chaperone was present for exam.  ASSESSMENT Post op visit, she is having pain but it has been improving since surgery She is seen at the pain clinic and is on suboxone. No signs of post op infection or bleeding. I've recommended that she f/u with her pain MD. Urinary hesitancy, doesn't feel she is voiding normally Tachycardia, declines taking any medication that would increase her HR.    PLAN Hgb 13.3 CBC sent  Post void residual is normal  Urine culture sent She must be seen for her tachycardia, she wants to go to her primaries office now. I told her that if  her primary didn't see her she must go to  Urgent care or ER for her tachycardia.

## 2020-06-06 ENCOUNTER — Telehealth: Payer: Self-pay

## 2020-06-06 LAB — CBC
Hematocrit: 41.5 % (ref 34.0–46.6)
Hemoglobin: 13 g/dL (ref 11.1–15.9)
MCH: 25.1 pg — ABNORMAL LOW (ref 26.6–33.0)
MCHC: 31.3 g/dL — ABNORMAL LOW (ref 31.5–35.7)
MCV: 80 fL (ref 79–97)
Platelets: 465 10*3/uL — ABNORMAL HIGH (ref 150–450)
RBC: 5.18 x10E6/uL (ref 3.77–5.28)
RDW: 16.7 % — ABNORMAL HIGH (ref 11.7–15.4)
WBC: 8.2 10*3/uL (ref 3.4–10.8)

## 2020-06-06 NOTE — Telephone Encounter (Signed)
-----   Message from Salvadore Dom, MD sent at 06/06/2020  2:39 PM EST ----- Please call the patient and let her know her CBC is okay. Please confirm that she was seen yesterday for her tachycardia and that she is being evaluated/treated as appropriate

## 2020-06-06 NOTE — Telephone Encounter (Signed)
Left message for call back and with results from CBC. To follow up on Tachycardia.

## 2020-06-07 LAB — URINE CULTURE

## 2020-06-24 ENCOUNTER — Encounter: Payer: Self-pay | Admitting: Obstetrics and Gynecology

## 2020-06-24 NOTE — Progress Notes (Signed)
GYNECOLOGY  VISIT   HPI: 40 y.o.   Married White or Caucasian Not Hispanic or Latino  female   [phone] with Patient's last menstrual period was 05/06/2020.   here for a four week post op check s/p TLH, ablation of endometriosis, bilateral salpingectomies and cystoscopy. Pathology with CIN III, otherwise negative. When she was seen a week after surgery she was tachycardic and urgent f/u was recommended, she didn't f/u. Hgb was 13.3.  Prior to her surgery she c/o a 3 month h/o nausea and c/o weight loss. She had preoperative clearance with her primary, she had an abnormal EKG and was seen and cleared by Cardiology.    Her nausea has improved, she has no appetite, eats small amounts. Tired, sleeping a lot. Her surgery pain has improved. She feels some "knots" under her incisions. Some tension on the incisions, improving.  She has some shaking in her hands since the surgery. Normal TSH in June.   Slight vaginal discharge. She has constipation (no change), BM 1-2 x a week, doesn't need to strain. Voiding okay.   She has a h/o chronic pain and is on suboxone.   GYNECOLOGIC HISTORY: Patient's last menstrual period was 05/06/2020. Contraception: hysterectomy  Menopausal hormone therapy: none         OB History    Gravida  2   Para  2   Term  2   Preterm      AB      Living  2     SAB      IAB      Ectopic      Multiple      Live Births  2              Patient Active Problem List   Diagnosis Date Noted  . S/P laparoscopic hysterectomy 05/28/2020  . Preoperative cardiovascular examination 05/20/2020  . Mixed dyslipidemia 05/20/2020  . Cigarette smoker 05/20/2020  . Abnormal EKG 05/20/2020  . Ovarian cyst   . Endometriosis   . Dysmenorrhea   . Depression   . Chronic pain   . Anxiety   . Anemia   . Prediabetes   . Elevated lipids   . 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 Encounter for insertion of mirena IUD 05/19/2017  . Atypical nevi 05/19/2017  .  Screening examination for STD (sexually transmitted disease) 05/19/2017  . Tobacco use 05/19/2017  . High serum low density lipoprotein (LDL) cholesterol 05/19/2017  . Healthcare maintenance 05/19/2017  . Vitamin D deficiency 05/19/2017  . Iron deficiency anemia 05/18/2017  . Chronic bilateral low back pain 03/05/2017  . Dysphagia, idiopathic 03/05/2017  . Opiate withdrawal ([place]) 03/04/2017    Past Medical History:  Diagnosis Date  . Anemia   . Anxiety   . Atypical nevi 05/19/2017  . Chronic bilateral low back pain 03/05/2017  . Chronic pain    back, hips and pelvis  . 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 Depression   . Dysmenorrhea   . Dysphagia, idiopathic 03/05/2017  . Elevated lipids   . Encounter for insertion of mirena IUD 05/19/2017  . Endometriosis   . Healthcare maintenance 05/19/2017  . High serum low density lipoprotein (LDL) cholesterol 05/19/2017  . Iron deficiency anemia 05/18/2017  . Opiate withdrawal ([place]) 03/04/2017  . Ovarian cyst   . Pre-diabetes   . Prediabetes   . Screening examination for STD (sexually transmitted disease) 05/19/2017  . Tobacco use 05/19/2017  . Vitamin D deficiency 05/19/2017  Past Surgical History:  Procedure Laterality Date  . CYSTOSCOPY N/A 05/28/2020   Procedure: CYSTOSCOPY;  Surgeon: Salvadore Dom, MD;  Location: [facility];  Service: Gynecology;  Laterality: N/A;  . LEEP    . LUMBAR DISC SURGERY    . NASAL SEPTUM SURGERY    . TOTAL LAPAROSCOPIC HYSTERECTOMY WITH SALPINGECTOMY N/A 05/28/2020   Procedure: TOTAL LAPAROSCOPIC HYSTERECTOMY WITH SALPINGECTOMY;  Surgeon: Salvadore Dom, MD;  Location: [facility];  Service: Gynecology;  Laterality: N/A;    Current Outpatient Medications  Medication Sig Dispense Refill  . acetaminophen (TYLENOL) 500 MG tablet Take 2 tablets (1,000 mg total) by mouth every 6 (six) hours. 30 tablet 0  . buprenorphine-naloxone (SUBOXONE) 2-0.5 mg  SUBL SL tablet Place 2 tablets under the tongue daily.    Marland Kitchen FLUoxetine (PROZAC) 10 MG capsule Take 20 mg by mouth daily with lunch.     . hydrOXYzine (ATARAX/VISTARIL) 25 MG tablet Take 1-2 tablets by mouth every 8 hours as needed for severe anxiety. 90 tablet 1  . ibuprofen (ADVIL) 800 MG tablet Take 1 tablet (800 mg total) by mouth every 8 (eight) hours as needed. 30 tablet 0  . ondansetron (ZOFRAN) 4 MG tablet Take 4 mg by mouth every 8 (eight) hours as needed for nausea or vomiting.     Marland Kitchen tiZANidine (ZANAFLEX) 4 MG tablet Take 4 mg by mouth 3 (three) times daily as needed (spasms.).    Marland Kitchen traZODone (DESYREL) 50 MG tablet Take 0.5-1 tablets (25-50 mg total) by mouth at bedtime as needed for sleep. (Patient taking differently: Take 50 mg by mouth at bedtime as needed for sleep.) 30 tablet 2   No current facility-administered medications for this visit.     ALLERGIES: Sulfa antibiotics  Family History  Problem Relation Age of Onset  . Arthritis Mother   . Anxiety disorder Mother   . Depression Mother   . Diabetes Mother   . Heart disease Mother   . Hyperlipidemia Mother   . Hypertension Mother   . Thyroid disease Mother   . Birth defects Sister   . Migraines Sister   . Healthy Brother   . Healthy Daughter   . Healthy Sister   . Drug abuse Brother   . Healthy Brother   . Healthy Daughter     Social History   Socioeconomic History  . Marital status: Married    Spouse name: Not on file  . Number of children: Not on file  . Years of education: Not on file  . Highest education level: Not on file  Occupational History  . Not on file  Tobacco Use  . Smoking status: Former Smoker    Packs/day: 1.50    Years: 23.00    Pack years: 34.50    Types: Cigarettes    Quit date: 1998    Years since quitting: 24.0  . Smokeless tobacco: Never Used  Vaping Use  . Vaping Use: Never used  Substance and Sexual Activity  . Alcohol use: No  . Drug use: No  . Sexual activity: Yes     Birth control/protection: None  Other Topics Concern  . Not on file  Social History Narrative  . Not on file   Social Determinants of Health   Financial Resource Strain: Not on file  Food Insecurity: Not on file  Transportation Needs: Not on file  Physical Activity: Not on file  Stress: Not on file  Social Connections: Not on file  Intimate Partner Violence: Not on file    Review of Systems  Gastrointestinal: Positive for constipation and nausea.       No appetite   Genitourinary:       Vaginal discharge- slightly bloody   Skin:       "knots on 3 incision sites"  All other systems reviewed and are negative.   PHYSICAL EXAMINATION:    BP 138/90 (BP Location: Right Arm, Patient Position: Sitting, Cuff Size: Normal)   Pulse 98   Resp 14   Ht 5' 5.5" (1.664 m)   Wt 96 lb 8 oz (43.8 kg)   LMP 05/06/2020 Comment: spotting   BMI 15.81 kg/m     General appearance: alert, cooperative and appears stated age, hands with a mild tremor.  Neck: no adenopathy, supple, symmetrical, trachea midline and thyroid normal to inspection and palpation  CV: tachycardic, normal rhythm, rate of ~120 BPM Lungs: CTAB Abdomen: soft, non-tender; non distended, no masses,  no organomegaly  Pelvic: External genitalia:  no lesions              Urethra:  normal appearing urethra with no masses, tenderness or lesions              Bartholins and Skenes: normal                 Vagina: normal appearing vagina with normal color and discharge, no lesions, cuff is healing, no granulation tissue noted.               Cervix: absent              Bimanual Exam:  Uterus:  uterus absent              Adnexa: no mass, fullness, tenderness                Chaperone was present for exam.  1. S/P laparoscopic hysterectomy Healing well from surgery  2. Tachycardia This has been present since her last visit, etiology not clear. Urged the patient to f/u, she declines visit to urgent care or ER, states she will f/u  with her primary She needs full evaluation  3. Weight loss Her nausea since prior to surgery has improved, but she has no appetite and continues to loose weight. I stressed the importance of f/u with her primary, I told her that I'm very concerned about her continued weight loss, her tremor, her tachycardia.    CC: Mayer Masker, PA-C

## 2020-06-25 ENCOUNTER — Ambulatory Visit (INDEPENDENT_AMBULATORY_CARE_PROVIDER_SITE_OTHER): Payer: 59 | Admitting: Obstetrics and Gynecology

## 2020-06-25 ENCOUNTER — Encounter: Payer: Self-pay | Admitting: Obstetrics and Gynecology

## 2020-06-25 ENCOUNTER — Other Ambulatory Visit: Payer: Self-pay

## 2020-06-25 VITALS — BP 110/70 | HR 120 | Resp 14 | Ht 65.5 in | Wt 96.5 lb

## 2020-06-25 DIAGNOSIS — R000 Tachycardia, unspecified: Secondary | ICD-10-CM

## 2020-06-25 DIAGNOSIS — R634 Abnormal weight loss: Secondary | ICD-10-CM

## 2020-06-25 DIAGNOSIS — Z90710 Acquired absence of both cervix and uterus: Secondary | ICD-10-CM

## 2020-06-28 DIAGNOSIS — R7303 Prediabetes: Secondary | ICD-10-CM | POA: Insufficient documentation

## 2020-07-01 ENCOUNTER — Encounter: Payer: Self-pay | Admitting: Cardiology

## 2020-07-01 ENCOUNTER — Other Ambulatory Visit: Payer: Self-pay

## 2020-07-01 ENCOUNTER — Ambulatory Visit (INDEPENDENT_AMBULATORY_CARE_PROVIDER_SITE_OTHER): Payer: 59 | Admitting: Cardiology

## 2020-07-01 ENCOUNTER — Ambulatory Visit (INDEPENDENT_AMBULATORY_CARE_PROVIDER_SITE_OTHER): Payer: 59

## 2020-07-01 VITALS — BP 114/72 | HR 108 | Ht 60.5 in | Wt 96.4 lb

## 2020-07-01 DIAGNOSIS — R002 Palpitations: Secondary | ICD-10-CM

## 2020-07-01 DIAGNOSIS — F17210 Nicotine dependence, cigarettes, uncomplicated: Secondary | ICD-10-CM

## 2020-07-01 DIAGNOSIS — I472 Ventricular tachycardia: Secondary | ICD-10-CM

## 2020-07-01 DIAGNOSIS — E785 Hyperlipidemia, unspecified: Secondary | ICD-10-CM

## 2020-07-01 DIAGNOSIS — I4729 Other ventricular tachycardia: Secondary | ICD-10-CM

## 2020-07-01 DIAGNOSIS — E782 Mixed hyperlipidemia: Secondary | ICD-10-CM | POA: Diagnosis not present

## 2020-07-01 HISTORY — DX: Palpitations: R00.2

## 2020-07-01 NOTE — Patient Instructions (Signed)
Medication Instructions:  No medication changes. *If you need a refill on your cardiac medications before your next appointment, please call your pharmacy*   Lab Work: None ordered If you have labs (blood work) drawn today and your tests are completely normal, you will receive your results only by: Marland Kitchen MyChart Message (if you have MyChart) OR . A paper copy in the mail If you have any lab test that is abnormal or we need to change your treatment, we will call you to review the results.   Testing/Procedures:  WHY IS MY DOCTOR PRESCRIBING ZIO? The Zio system is proven and trusted by physicians to detect and diagnose irregular heart rhythms -- and has been prescribed to hundreds of thousands of patients.  The FDA has cleared the Zio system to monitor for many different kinds of irregular heart rhythms. In a study, physicians were able to reach a diagnosis 90% of the time with the Zio system1.  You can wear the Zio monitor -- a small, discreet, comfortable patch -- during your normal day-to-day activity, including while you sleep, shower, and exercise, while it records every single heartbeat for analysis.  1Barrett, P., et al. Comparison of 24 Hour Holter Monitoring Versus 14 Day Novel Adhesive Patch Electrocardiographic Monitoring. [place], 2014.  ZIO VS. HOLTER MONITORING The Zio monitor can be comfortably worn for up to 14 days. Holter monitors can be worn for 24 to 48 hours, limiting the time to record any irregular heart rhythms you may have. Zio is able to capture data for the 51% of patients who have their first symptom-triggered arrhythmia after 48 hours.1  LIVE WITHOUT RESTRICTIONS The Zio ambulatory cardiac monitor is a small, unobtrusive, and water-resistant patch--you might even forget you're wearing it. The Zio monitor records and stores every beat of your heart, whether you're sleeping, working out, or showering.  Wear the monitor for 3 days, remove  07/04/2020 after 4:00.   Follow-Up: At [facility], you and your health needs are our priority.  As part of our continuing mission to provide you with exceptional heart care, we have created designated Provider Care Teams.  These Care Teams include your primary Cardiologist (physician) and Advanced Practice Providers (APPs -  Physician Assistants and Nurse Practitioners) who all work together to provide you with the care you need, when you need it.  We recommend signing up for the patient portal called "MyChart".  Sign up information is provided on this After Visit Summary.  MyChart is used to connect with patients for Virtual Visits (Telemedicine).  Patients are able to view lab/test results, encounter notes, upcoming appointments, etc.  Non-urgent messages can be sent to your provider as well.   To learn more about what you can do with MyChart, go to [email or web address].    Your next appointment:   1 month(s)  The format for your next appointment:   In Person  Provider:   Jyl Heinz, MD   Other Instructions NA

## 2020-07-01 NOTE — Progress Notes (Signed)
Cardiology Office Note:    Date:  07/01/2020   ID:  Rachel Bennett, DOB 06/20/1981, MRN [id number]  PCP:  Lorrene Reid, PA-C  Cardiologist:  Jenean Lindau, MD   Referring MD: Lorrene Reid, PA-C    ASSESSMENT:    1. Cigarette smoker   2. Elevated lipids   3. Mixed dyslipidemia   4. Palpitations    PLAN:    In order of problems listed above:  1. Primary prevention stressed with the patient.  Importance of compliance with diet medication stressed and she vocalized understanding. 2. Mixed dyslipidemia: Diet was emphasized.  Lipids were reviewed extensively and she promises to do better. 3. Cigarette smoker: I spent 5 minutes with the patient discussing solely about smoking. Smoking cessation was counseled. I suggested to the patient also different medications and pharmacological interventions. Patient is keen to try stopping on its own at this time. He will get back to me if he needs any further assistance in this matter. 4. Palpitations and sinus tachycardia: Her TSH was unremarkable.  She tells me that she is borderline anemic.  I told her to keep her self well-hydrated.  I will do a 3-day monitor to assess her baseline heart rates.  She might be a candidate for medication such as Corlanor if she remains tachycardic.  I am hesitant on putting on her medications such as beta-blockers because of borderline blood pressure and the fact that she may already have some element of postural hypotension.  I told her to take extra salt and water in the diet and keep herself well-hydrated. 5. Patient will be seen in follow-up appointment in 6 months or earlier if the patient has any concerns    Medication Adjustments/Labs and Tests Ordered: Current medicines are reviewed at length with the patient today.  Concerns regarding medicines are outlined above.  No orders of the defined types were placed in this encounter.  No orders of the defined types were placed in this encounter.    No  chief complaint on file.    History of Present Illness:    Rachel Bennett is a 40 y.o. female.  Patient was evaluated by me for preop reasons.  Her echocardiogram and stress test was unremarkable.  She has had a hysterectomy.  She tells me that her blood count is fine and she is borderline anemic.  Recent TSH is unremarkable.  Her doctor has now referred her here for tachycardia.  No chest pain orthopnea PND.  She feels dizzy at times but she is never passed out.  At the time of my evaluation, the patient is alert awake oriented and in no distress.  Past Medical History:  Diagnosis Date  . Abnormal EKG 05/20/2020  . Anemia   . Anxiety   . Atypical nevi 05/19/2017  . Chronic bilateral low back pain 03/05/2017  . Chronic pain    back, hips and pelvis  . 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 Cigarette smoker 05/20/2020  . Depression   . Dysmenorrhea   . Dysphagia, idiopathic 03/05/2017  . Elevated lipids   . Encounter for insertion of mirena IUD 05/19/2017  . Endometriosis   . Healthcare maintenance 05/19/2017  . High serum low density lipoprotein (LDL) cholesterol 05/19/2017  . Iron deficiency anemia 05/18/2017  . Mixed dyslipidemia 05/20/2020  . Opiate withdrawal ([place]) 03/04/2017  . Ovarian cyst   . Pre-diabetes   . Prediabetes   . Preoperative cardiovascular examination 05/20/2020  . S/P laparoscopic hysterectomy 05/28/2020  .  Screening examination for STD (sexually transmitted disease) 05/19/2017  . Tobacco use 05/19/2017  . Vitamin D deficiency 05/19/2017    Past Surgical History:  Procedure Laterality Date  . CYSTOSCOPY N/A 05/28/2020   Procedure: CYSTOSCOPY;  Surgeon: Salvadore Dom, MD;  Location: [facility];  Service: Gynecology;  Laterality: N/A;  . LEEP    . LUMBAR DISC SURGERY    . NASAL SEPTUM SURGERY    . TOTAL LAPAROSCOPIC HYSTERECTOMY WITH SALPINGECTOMY N/A 05/28/2020   Procedure: TOTAL LAPAROSCOPIC HYSTERECTOMY WITH SALPINGECTOMY;   Surgeon: Salvadore Dom, MD;  Location: [facility];  Service: Gynecology;  Laterality: N/A;    Current Medications: Current Meds  Medication Sig  . acetaminophen (TYLENOL) 500 MG tablet Take 2 tablets (1,000 mg total) by mouth every 6 (six) hours.  . buprenorphine-naloxone (SUBOXONE) 8-2 mg SUBL SL tablet Place 2 tablets under the tongue daily.  Marland Kitchen FLUoxetine (PROZAC) 10 MG capsule Take 20 mg by mouth daily with lunch.   . hydrOXYzine (ATARAX/VISTARIL) 25 MG tablet Take 1-2 tablets by mouth every 8 hours as needed for severe anxiety.  . ondansetron (ZOFRAN) 4 MG tablet Take 4 mg by mouth every 8 (eight) hours as needed for nausea or vomiting.   . traZODone (DESYREL) 50 MG tablet Take 0.5-1 tablets (25-50 mg total) by mouth at bedtime as needed for sleep.     Allergies:   Sulfa antibiotics   Social History   Socioeconomic History  . Marital status: Married    Spouse name: Not on file  . Number of children: Not on file  . Years of education: Not on file  . Highest education level: Not on file  Occupational History  . Not on file  Tobacco Use  . Smoking status: Former Smoker    Packs/day: 1.50    Years: 23.00    Pack years: 34.50    Types: Cigarettes    Quit date: 1998    Years since quitting: 24.0  . Smokeless tobacco: Never Used  Vaping Use  . Vaping Use: Never used  Substance and Sexual Activity  . Alcohol use: No  . Drug use: No  . Sexual activity: Yes    Birth control/protection: None  Other Topics Concern  . Not on file  Social History Narrative  . Not on file   Social Determinants of Health   Financial Resource Strain: Not on file  Food Insecurity: Not on file  Transportation Needs: Not on file  Physical Activity: Not on file  Stress: Not on file  Social Connections: Not on file     Family History: The patient's family history includes Anxiety disorder in her mother; Arthritis in her mother; Birth defects in her sister; Depression in  her mother; Diabetes in her mother; Drug abuse in her brother; Healthy in her brother, brother, daughter, daughter, and sister; Heart disease in her mother; Hyperlipidemia in her mother; Hypertension in her mother; Migraines in her sister; Thyroid disease in her mother.  ROS:   Please see the history of present illness.    All other systems reviewed and are negative.  EKGs/Labs/Other Studies Reviewed:    The following studies were reviewed today: Study Highlights   The left ventricular ejection fraction is mildly decreased (45-54%).  Visually the systolic function appears preserved. Clinical correlation suggested. Echo may be useful.  There was no ST segment deviation noted during stress.  The study is normal.  This is a low risk study.   IMPRESSIONS  1. Left ventricular ejection fraction, by estimation, is 55 to 60%. The  left ventricle has normal function. The left ventricle has no regional  wall motion abnormalities. Left ventricular diastolic parameters were  normal.  2. Right ventricular systolic function is normal. The right ventricular  size is normal. There is normal pulmonary artery systolic pressure. The  estimated right ventricular systolic pressure is 0000000 mmHg.  3. The mitral valve is grossly normal. No evidence of mitral valve  regurgitation. No evidence of mitral stenosis.  4. The aortic valve is tricuspid. Aortic valve regurgitation is not  visualized. No aortic stenosis is present.  5. The inferior vena cava is normal in size with greater than 50%  respiratory variability, suggesting right atrial pressure of 3 mmHg.   Conclusion(s)/Recommendation(s): Normal biventricular function without  evidence of hemodynamically significant valvular heart disease.       Recent Labs: 04/08/2020: ALT 7; BUN 14; Creatinine, Ser 0.84; TSH 2.720 04/18/2020: Magnesium 2.0; Potassium 3.4; Sodium 140 06/05/2020: Hemoglobin 13.0; Platelets 465  Recent Lipid Panel     Component Value Date/Time   CHOL 226 (H) 04/08/2020 1035   TRIG 91 04/08/2020 1035   HDL 36 (L) 04/08/2020 1035   CHOLHDL 6.3 (H) 04/08/2020 1035   LDLCALC 174 (H) 04/08/2020 1035    Physical Exam:    VS:  BP 114/72   Pulse (!) 108   Ht 5' 0.5" (1.537 m)   Wt 96 lb 6.4 oz (43.7 kg)   LMP 05/06/2020 Comment: spotting   SpO2 95%   BMI 18.52 kg/m     Wt Readings from Last 3 Encounters:  07/01/20 96 lb 6.4 oz (43.7 kg)  06/25/20 96 lb 8 oz (43.8 kg)  06/05/20 98 lb (44.5 kg)     GEN: Patient is in no acute distress HEENT: Normal NECK: No JVD; No carotid bruits LYMPHATICS: No lymphadenopathy CARDIAC: Hear sounds regular, 2/6 systolic murmur at the apex. RESPIRATORY:  Clear to auscultation without rales, wheezing or rhonchi  ABDOMEN: Soft, non-tender, non-distended MUSCULOSKELETAL:  No edema; No deformity  SKIN: Warm and dry NEUROLOGIC:  Alert and oriented x 3 PSYCHIATRIC:  Normal affect   Signed, Jenean Lindau, MD  07/01/2020 3:45 PM    [facility]

## 2020-07-11 ENCOUNTER — Other Ambulatory Visit: Payer: Self-pay | Admitting: Physician Assistant

## 2020-07-24 ENCOUNTER — Other Ambulatory Visit: Payer: Self-pay

## 2020-07-24 ENCOUNTER — Encounter: Payer: Self-pay | Admitting: Physician Assistant

## 2020-07-24 ENCOUNTER — Ambulatory Visit (INDEPENDENT_AMBULATORY_CARE_PROVIDER_SITE_OTHER): Payer: 59 | Admitting: Physician Assistant

## 2020-07-24 VITALS — BP 112/74 | HR 138 | Ht 60.5 in | Wt 93.7 lb

## 2020-07-24 DIAGNOSIS — R634 Abnormal weight loss: Secondary | ICD-10-CM

## 2020-07-24 DIAGNOSIS — F32A Depression, unspecified: Secondary | ICD-10-CM | POA: Diagnosis not present

## 2020-07-24 DIAGNOSIS — R000 Tachycardia, unspecified: Secondary | ICD-10-CM

## 2020-07-24 DIAGNOSIS — F419 Anxiety disorder, unspecified: Secondary | ICD-10-CM | POA: Diagnosis not present

## 2020-07-24 MED ORDER — METOPROLOL SUCCINATE ER 25 MG PO TB24: 25.0000 mg | ORAL_TABLET | Freq: Every day | ORAL | 6 refills | Status: DC

## 2020-07-24 MED ORDER — MIRTAZAPINE 15 MG PO TABS: 15.0000 mg | ORAL_TABLET | Freq: Every day | ORAL | 1 refills | Status: DC

## 2020-07-24 NOTE — Progress Notes (Signed)
Established Patient Office Visit  Subjective:  Patient ID: Rachel Bennett, female    DOB: [birth year]  Age: 40 y.o. MRN: [id number]  CC:  Chief Complaint  Patient presents with  . Depression    HPI Rachel Bennett presents for follow up on mood management.  Patient continues to lose weight.  Reports decreased appetite.  Started drinking boost 2 weeks ago, reports for the past few days has been relying on boost drinks since she has zero appetite. Trying to stay hydrated by drinking Gatorade and water as recommended by her cardiologist.   Continues to have palpitations and recently had a heart monitor, and is awaiting results. Has also been experiencing hand tremor since her surgery which is constant. Patient is s/p total hysterectomy and denies bleeding. No longer having nausea or vomiting. Feels dizzy when standing up and feels tired throughout the day. Denies chest pain, headache, night sweats, hemoptysis, melena, hematochezia, syncope or altered mental status. Continues to smoke 1 PPD. States Hydroxyzine has helped with keeping her anxiety stable, taking fluoxetine as directed without issues.  Past Medical History:  Diagnosis Date  . Abnormal EKG 05/20/2020  . Anemia   . Anxiety   . Atypical nevi 05/19/2017  . Chronic bilateral low back pain 03/05/2017  . Chronic pain    back, hips and pelvis  . 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 Cigarette smoker 05/20/2020  . Depression   . Dysmenorrhea   . Dysphagia, idiopathic 03/05/2017  . Elevated lipids   . Encounter for insertion of mirena IUD 05/19/2017  . Endometriosis   . Healthcare maintenance 05/19/2017  . High serum low density lipoprotein (LDL) cholesterol 05/19/2017  . Iron deficiency anemia 05/18/2017  . Mixed dyslipidemia 05/20/2020  . Opiate withdrawal ([place]) 03/04/2017  . Ovarian cyst   . Pre-diabetes   . Prediabetes   . Preoperative cardiovascular examination 05/20/2020  . S/P laparoscopic hysterectomy 05/28/2020   . Screening examination for STD (sexually transmitted disease) 05/19/2017  . Tobacco use 05/19/2017  . Vitamin D deficiency 05/19/2017    Past Surgical History:  Procedure Laterality Date  . CYSTOSCOPY N/A 05/28/2020   Procedure: CYSTOSCOPY;  Surgeon: Salvadore Dom, MD;  Location: [facility];  Service: Gynecology;  Laterality: N/A;  . LEEP    . LUMBAR DISC SURGERY    . NASAL SEPTUM SURGERY    . TOTAL LAPAROSCOPIC HYSTERECTOMY WITH SALPINGECTOMY N/A 05/28/2020   Procedure: TOTAL LAPAROSCOPIC HYSTERECTOMY WITH SALPINGECTOMY;  Surgeon: Salvadore Dom, MD;  Location: [facility];  Service: Gynecology;  Laterality: N/A;    Family History  Problem Relation Age of Onset  . Arthritis Mother   . Anxiety disorder Mother   . Depression Mother   . Diabetes Mother   . Heart disease Mother   . Hyperlipidemia Mother   . Hypertension Mother   . Thyroid disease Mother   . Birth defects Sister   . Migraines Sister   . Healthy Brother   . Healthy Daughter   . Healthy Sister   . Drug abuse Brother   . Healthy Brother   . Healthy Daughter     Social History   Socioeconomic History  . Marital status: Married    Spouse name: Not on file  . Number of children: Not on file  . Years of education: Not on file  . Highest education level: Not on file  Occupational History  . Not on file  Tobacco Use  . Smoking status:  Former Smoker    Packs/day: 1.50    Years: 23.00    Pack years: 34.50    Types: Cigarettes    Quit date: 1998    Years since quitting: 24.1  . Smokeless tobacco: Never Used  Vaping Use  . Vaping Use: Never used  Substance and Sexual Activity  . Alcohol use: No  . Drug use: No  . Sexual activity: Yes    Birth control/protection: None  Other Topics Concern  . Not on file  Social History Narrative  . Not on file   Social Determinants of Health   Financial Resource Strain: Not on file  Food Insecurity: Not on file   Transportation Needs: Not on file  Physical Activity: Not on file  Stress: Not on file  Social Connections: Not on file  Intimate Partner Violence: Not on file    Outpatient Medications Prior to Visit  Medication Sig Dispense Refill  . acetaminophen (TYLENOL) 500 MG tablet Take 2 tablets (1,000 mg total) by mouth every 6 (six) hours. 30 tablet 0  . buprenorphine-naloxone (SUBOXONE) 8-2 mg SUBL SL tablet Place 2 tablets under the tongue daily.    Marland Kitchen FLUoxetine (PROZAC) 10 MG capsule TAKE 1 CAPSULE (10 MG TOTAL) BY MOUTH ONCE DAILY FOR 7 DAYS, THEN 2 CAPSULES (20 MG TOTAL) DAILY 60 capsule 0  . hydrOXYzine (ATARAX/VISTARIL) 25 MG tablet Take 1-2 tablets by mouth every 8 hours as needed for severe anxiety. 90 tablet 1  . ondansetron (ZOFRAN) 4 MG tablet Take 4 mg by mouth every 8 (eight) hours as needed for nausea or vomiting.     . traZODone (DESYREL) 50 MG tablet Take 0.5-1 tablets (25-50 mg total) by mouth at bedtime as needed for sleep. 30 tablet 2   No facility-administered medications prior to visit.    Allergies  Allergen Reactions  . Sulfa Antibiotics Itching    ROS Review of Systems A fourteen system review of systems was performed and found to be positive as per HPI.    Objective:    Physical Exam General:  Thin appearing, cooperative, mild pallor  Neuro:  Alert and oriented,  extra-ocular muscles intact, CN II-XII grossly intact, tremor noted at rest and with activity (opening water bottle)  HEENT:  Normocephalic, atraumatic, neck supple Skin:  no gross rash, warm, pink. Cardiac:  RRR, S1 S2 Respiratory:  Scattered rhonchi with decreased breath sounds, Not using accessory muscles, speaking in full sentences- unlabored. Vascular:  Ext warm, no cyanosis apprec.; cap RF less 2 sec. Psych:  No HI/SI, judgement and insight good, normal behavior  BP 112/74   Pulse (!) 138   Ht 5' 0.5" (1.537 m)   Wt 93 lb 11.2 oz (42.5 kg)   LMP 05/06/2020 Comment: spotting   SpO2 92%    BMI 18.00 kg/m  Wt Readings from Last 3 Encounters:  07/25/20 93 lb (42.2 kg)  07/24/20 93 lb 11.2 oz (42.5 kg)  07/01/20 96 lb 6.4 oz (43.7 kg)     Health Maintenance Due  Topic Date Due  . COVID-19 Vaccine (1) Never done    There are no preventive care reminders to display for this patient.  Lab Results  Component Value Date   TSH 3.620 07/24/2020   Lab Results  Component Value Date   WBC 8.9 07/24/2020   HGB 14.4 07/24/2020   HCT 46.0 07/24/2020   MCV 80 07/24/2020   PLT 410 07/24/2020   Lab Results  Component Value Date   NA 139  07/24/2020   K 4.9 07/24/2020   CO2 23 07/24/2020   GLUCOSE 155 (H) 07/24/2020   BUN 29 (H) 07/24/2020   CREATININE 0.84 07/24/2020   BILITOT 0.5 07/24/2020   ALKPHOS 90 07/24/2020   AST 20 07/24/2020   ALT 11 07/24/2020   PROT 8.1 07/24/2020   ALBUMIN 4.9 (H) 07/24/2020   CALCIUM 10.1 07/24/2020   Lab Results  Component Value Date   CHOL 226 (H) 04/08/2020   Lab Results  Component Value Date   HDL 36 (L) 04/08/2020   Lab Results  Component Value Date   LDLCALC 174 (H) 04/08/2020   Lab Results  Component Value Date   TRIG 91 04/08/2020   Lab Results  Component Value Date   CHOLHDL 6.3 (H) 04/08/2020   Lab Results  Component Value Date   HGBA1C 5.8 (H) 04/08/2020      Assessment & Plan:   Problem List Items Addressed This Visit   None   Visit Diagnoses    Unintentional weight loss    -  Primary   Relevant Orders   TSH (Completed)   T4, free (Completed)   T3 (Completed)   Comprehensive metabolic panel (Completed)   CBC (Completed)   Ambulatory referral to Hematology   Anxiety and depression       Relevant Medications   mirtazapine (REMERON) 15 MG tablet   Tachycardia         Unintentional weight loss: -BMI 15.24, patient has lost 7 pounds since 04/2020. -Patient had extensive blood work by pain management late 03/2020 which included full thyroid work up which was normal, homocysteine was mildly  elevated, arsenic level low at 1, lithium low at <0.1, vitamin D 19.0.  -Etiology is unclear. Discussed with patient changing fluoxetine to mirtazapine to help improve appetite and weight. Patient verbalized understanding and is agreeable. Anorexia 4-10%, weight loss 2%  and tremor 3-13% are adverse reactions with fluoxetine so possibly medication contributing to symptoms which was started 03/2020. However, given heavy tobacco use and other lab abnormalities as mentioned above recommend further evaluation. Will repeat thyroid function tests, CBC, and CMP. Pending lab results, discussed with patient referral to hematology and/or imaging studies such as CXR or abd CT w/wo contrast.  -Continue boost drinks and recommend eating 3 small meals/day.  -Follow up in 6 weeks.  Anxiety and depression: -PHQ-9 score of 11, stable. Denies SI/HI -Will taper fluoxetine and start mirtazapine 15 mg. Provided tapering instructions. Continue hydroxyzine as needed for severe anxiety. -Follow up in 6 weeks.  Tachycardia: -Followed by cardiology. -Pending Holter monitor results, recommend starting treatment for tachycardia. Pulse today 138, repeat 121. -Recommend to stay hydrated and avoid caffeine.  Of note, labs collected and sent out before magnesium level could be added. Will contact [facility] and add Magnesium level as requested by Cardiologist, Dr. Geraldo Pitter.   Meds ordered this encounter  Medications  . mirtazapine (REMERON) 15 MG tablet    Sig: Take 1 tablet (15 mg total) by mouth at bedtime.    Dispense:  30 tablet    Refill:  1    Follow-up: Return in about 6 weeks (around 09/04/2020) for Mood and weight.    Lorrene Reid, PA-C

## 2020-07-24 NOTE — Addendum Note (Signed)
Addended by: Truddie Hidden on: 07/24/2020 05:08 PM   Modules accepted: Orders

## 2020-07-24 NOTE — Patient Instructions (Signed)
Prozac Tapering: Take 1 tab by mouth for 1 week then take 1/2 tablet by mouth for 1 week. Then start Mirtazapine.   High-Protein and High-Calorie Diet Eating high-protein and high-calorie foods can help you to gain weight, heal after an injury, and recover after an illness or surgery. The specific amount of daily protein and calories you need depends on:  Your body weight.  The reason this diet is recommended for you. What is my plan? Generally, a high-protein, high-calorie diet involves:  Eating 250-500 extra calories each day.  Making sure that you get enough of your daily calories from protein. Ask your health care provider how many of your calories should come from protein. Talk with a health care provider, such as a diet and nutrition specialist (dietitian), about how much protein and how many calories you need each day. Follow the diet as directed by your health care provider. What are tips for following this plan? Preparing meals  Add whole milk, half-and-half, or heavy cream to cereal, pudding, soup, or hot cocoa.  Add whole milk to instant breakfast drinks.  Add peanut butter to oatmeal or smoothies.  Add powdered milk to baked goods, smoothies, or milkshakes.  Add powdered milk, cream, or butter to mashed potatoes.  Add cheese to cooked vegetables.  Make whole-milk yogurt parfaits. Top them with granola, fruit, or nuts.  Add cottage cheese to your fruit.  Add avocado, cheese, or both to sandwiches or salads.  Add meat, poultry, or seafood to rice, pasta, casseroles, salads, and soups.  Use mayonnaise when making egg salad, chicken salad, or tuna salad.  Use peanut butter as a dip for vegetables or as a topping for pretzels, celery, or crackers.  Add beans to casseroles, dips, and spreads.  Add pureed beans to sauces and soups.  Replace calorie-free drinks with calorie-containing drinks, such as milk and fruit juice.  Replace water with milk or heavy cream  when making foods such as oatmeal, pudding, or cocoa. General instructions  Ask your health care provider if you should take a nutritional supplement.  Try to eat six small meals each day instead of three large meals.  Eat a balanced diet. In each meal, include one food that is high in protein.  Keep nutritious snacks available, such as nuts, trail mixes, dried fruit, and yogurt.  If you have kidney disease or diabetes, talk with your health care provider about how much protein is safe for you. Too much protein may put extra stress on your kidneys.  Drink your calories. Choose high-calorie drinks and have them after your meals.   What high-protein foods should I eat? Vegetables Soybeans. Peas. Grains Quinoa. Bulgur wheat. Meats and other proteins Beef, pork, and poultry. Fish and seafood. Eggs. Tofu. Textured vegetable protein (TVP). Peanut butter. Nuts and seeds. Dried beans. Protein powders. Dairy Whole milk. Whole-milk yogurt. Powdered milk. Cheese. [employer]. Eggnog. Beverages High-protein supplement drinks. Soy milk. Other foods Protein bars. The items listed above may not be a complete list of high-protein foods and beverages. Contact a dietitian for more options.   What high-calorie foods should I eat? Fruits Dried fruit. Fruit leather. Canned fruit in syrup. Fruit juice. Avocado. Vegetables Vegetables cooked in oil or butter. Fried potatoes. Grains Pasta. Quick breads. Muffins. Pancakes. Ready-to-eat cereal. Meats and other proteins Peanut butter. Nuts and seeds. Dairy Heavy cream. Whipped cream. Cream cheese. Sour cream. Ice cream. Custard. Pudding. Beverages Meal-replacement beverages. Nutrition shakes. Fruit juice. Sugar-sweetened soft drinks. Seasonings and condiments Salad  dressing. Mayonnaise. Alfredo sauce. Fruit preserves or jelly. Honey. Syrup. Sweets and desserts Cake. Cookies. Pie. Pastries. Candy bars. Chocolate. Fats and oils Butter or  margarine. Oil. Gravy. Other foods Meal-replacement bars. The items listed above may not be a complete list of high-calorie foods and beverages. Contact a dietitian for more options. Summary  A high-protein, high-calorie diet can help you gain weight or heal faster after an injury, illness, or surgery.  To increase your protein and calories, add ingredients such as whole milk, peanut butter, cheese, beans, meat, or seafood to meal items.  To get enough extra calories each day, include high-calorie foods and beverages at each meal.  Adding a high-calorie drink or shake can be an easy way to help you get enough calories each day. Talk with your healthcare provider or dietitian about the best options for you. This information is not intended to replace advice given to you by your health care provider. Make sure you discuss any questions you have with your health care provider. Document Revised: 05/21/2017 Document Reviewed: 04/20/2017 Elsevier Patient Education  2021 [employer].

## 2020-07-24 NOTE — Addendum Note (Signed)
Addended by: Truddie Hidden on: 07/24/2020 04:46 PM   Modules accepted: Orders

## 2020-07-25 ENCOUNTER — Ambulatory Visit (INDEPENDENT_AMBULATORY_CARE_PROVIDER_SITE_OTHER): Payer: 59

## 2020-07-25 VITALS — BP 100/70 | HR 92 | Ht 65.5 in | Wt 93.0 lb

## 2020-07-25 DIAGNOSIS — Z23 Encounter for immunization: Secondary | ICD-10-CM | POA: Diagnosis not present

## 2020-07-25 LAB — CBC
Hematocrit: 46 % (ref 34.0–46.6)
Hemoglobin: 14.4 g/dL (ref 11.1–15.9)
MCH: 25 pg — ABNORMAL LOW (ref 26.6–33.0)
MCHC: 31.3 g/dL — ABNORMAL LOW (ref 31.5–35.7)
MCV: 80 fL (ref 79–97)
Platelets: 410 10*3/uL (ref 150–450)
RBC: 5.75 x10E6/uL — ABNORMAL HIGH (ref 3.77–5.28)
RDW: 18.8 % — ABNORMAL HIGH (ref 11.7–15.4)
WBC: 8.9 10*3/uL (ref 3.4–10.8)

## 2020-07-25 LAB — COMPREHENSIVE METABOLIC PANEL
ALT: 11 IU/L (ref 0–32)
AST: 20 IU/L (ref 0–40)
Albumin/Globulin Ratio: 1.5 (ref 1.2–2.2)
Albumin: 4.9 g/dL — ABNORMAL HIGH (ref 3.8–4.8)
Alkaline Phosphatase: 90 IU/L (ref 44–121)
BUN/Creatinine Ratio: 35 — ABNORMAL HIGH (ref 9–23)
BUN: 29 mg/dL — ABNORMAL HIGH (ref 6–24)
Bilirubin Total: 0.5 mg/dL (ref 0.0–1.2)
CO2: 23 mmol/L (ref 20–29)
Calcium: 10.1 mg/dL (ref 8.7–10.2)
Chloride: 97 mmol/L (ref 96–106)
Creatinine, Ser: 0.84 mg/dL (ref 0.57–1.00)
GFR calc Af Amer: 101 mL/min/{1.73_m2} (ref >59)
GFR calc non Af Amer: 87 mL/min/{1.73_m2} (ref >59)
Globulin, Total: 3.2 g/dL (ref 1.5–4.5)
Glucose: 155 mg/dL — ABNORMAL HIGH (ref 65–99)
Potassium: 4.9 mmol/L (ref 3.5–5.2)
Sodium: 139 mmol/L (ref 134–144)
Total Protein: 8.1 g/dL (ref 6.0–8.5)

## 2020-07-25 LAB — TSH: TSH: 3.62 u[IU]/mL (ref 0.450–4.500)

## 2020-07-25 LAB — T3: T3, Total: 185 ng/dL — ABNORMAL HIGH (ref 71–180)

## 2020-07-25 LAB — T4, FREE: Free T4: 1.04 ng/dL (ref 0.82–1.77)

## 2020-07-25 NOTE — Progress Notes (Signed)
Patient in today for 3rd Gardasil injection.   Contraception: Hysterectomy LMP: 05/06/20 Last AEX: 01/15/20 with JJ  Injection given in LD. Patient tolerated shot well.   Patient has completed the HPV vaccination series.  Routed to provider for final review.  Encounter closed.

## 2020-07-26 LAB — SPECIMEN STATUS REPORT

## 2020-07-26 LAB — MAGNESIUM: Magnesium: 2.4 mg/dL — ABNORMAL HIGH (ref 1.6–2.3)

## 2020-08-01 ENCOUNTER — Encounter: Payer: Self-pay | Admitting: Cardiology

## 2020-08-01 ENCOUNTER — Ambulatory Visit (INDEPENDENT_AMBULATORY_CARE_PROVIDER_SITE_OTHER): Payer: 59 | Admitting: Cardiology

## 2020-08-01 ENCOUNTER — Other Ambulatory Visit: Payer: Self-pay | Admitting: Physician Assistant

## 2020-08-01 ENCOUNTER — Other Ambulatory Visit: Payer: Self-pay

## 2020-08-01 VITALS — BP 100/78 | HR 96 | Ht 65.0 in | Wt 93.6 lb

## 2020-08-01 DIAGNOSIS — E782 Mixed hyperlipidemia: Secondary | ICD-10-CM | POA: Diagnosis not present

## 2020-08-01 DIAGNOSIS — F32A Depression, unspecified: Secondary | ICD-10-CM

## 2020-08-01 DIAGNOSIS — F17210 Nicotine dependence, cigarettes, uncomplicated: Secondary | ICD-10-CM | POA: Diagnosis not present

## 2020-08-01 DIAGNOSIS — E785 Hyperlipidemia, unspecified: Secondary | ICD-10-CM | POA: Diagnosis not present

## 2020-08-01 DIAGNOSIS — R002 Palpitations: Secondary | ICD-10-CM | POA: Diagnosis not present

## 2020-08-01 MED ORDER — METOPROLOL SUCCINATE ER 50 MG PO TB24
50.0000 mg | ORAL_TABLET | Freq: Every day | ORAL | 1 refills | Status: DC
Start: 2020-08-01 — End: 2021-01-07

## 2020-08-01 NOTE — Progress Notes (Signed)
Cardiology Office Note:    Date:  08/01/2020   ID:  [name], DOB September 17, 1980, MRN [id number]  PCP:  Lorrene Reid, PA-C  Cardiologist:  Jenean Lindau, MD   Referring MD: Lorrene Reid, PA-C    ASSESSMENT:    1. Mixed dyslipidemia   2. Palpitations   3. Elevated lipids   4. Cigarette smoker    PLAN:    In order of problems listed above:  1. I discussed my findings with the patient at extensive length and primary prevention stressed. 2. Palpitations: These have much better now with the beta-blocker on board.  She is trying to walk and exercise regularly.  Resting heart rate is still elevated and I will double her beta-blocker.  She was advised to keep her self well-hydrated. 3. Nonsustained ventricular tachycardia: Stable no evidence of ischemia on stress testing.  His ejection fraction is fine and she is on beta-blocker and tolerating well. 4. Mixed dyslipidemia: Marked elevation in lipids and I will recheck it in a month and she may need statin therapy.  I will also do a calcium scoring CT scan to assess risks and stratification process and diet therapy. 5. Smoking cessation was counseled. 6. Patient will be seen in follow-up appointment in 3 months or earlier if the patient has any concerns    Medication Adjustments/Labs and Tests Ordered: Current medicines are reviewed at length with the patient today.  Concerns regarding medicines are outlined above.  Orders Placed This Encounter  Procedures  . CT CARDIAC SCORING (SELF PAY ONLY)  . Basic metabolic panel  . Hepatic function panel  . Lipid panel   Meds ordered this encounter  Medications  . metoprolol succinate (TOPROL XL) 50 MG 24 hr tablet    Sig: Take 1 tablet (50 mg total) by mouth daily.    Dispense:  90 tablet    Refill:  1     No chief complaint on file.    History of Present Illness:    Rachel Bennett is a 40 y.o. female.  Patient was evaluated for palpitations.  He mentions to me that  with beta-blocker it has helped her some.  No chest pain orthopnea or PND.  Also evaluation revealed nonsustained ventricular tachycardia.  At the time of my evaluation, the patient is alert awake oriented and in no distress.  Past Medical History:  Diagnosis Date  . Abnormal EKG 05/20/2020  . Anemia   . Anxiety   . Atypical nevi 05/19/2017  . Chronic bilateral low back pain 03/05/2017  . Chronic pain    back, hips and pelvis  . 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 Cigarette smoker 05/20/2020  . Depression   . Dysmenorrhea   . Dysphagia, idiopathic 03/05/2017  . Elevated lipids   . Encounter for insertion of mirena IUD 05/19/2017  . Endometriosis   . Healthcare maintenance 05/19/2017  . High serum low density lipoprotein (LDL) cholesterol 05/19/2017  . Iron deficiency anemia 05/18/2017  . Mixed dyslipidemia 05/20/2020  . Opiate withdrawal ([place]) 03/04/2017  . Ovarian cyst   . Palpitations 07/01/2020  . Pre-diabetes   . Prediabetes   . Preoperative cardiovascular examination 05/20/2020  . S/P laparoscopic hysterectomy 05/28/2020  . Screening examination for STD (sexually transmitted disease) 05/19/2017  . Tobacco use 05/19/2017  . Vitamin D deficiency 05/19/2017    Past Surgical History:  Procedure Laterality Date  . CYSTOSCOPY N/A 05/28/2020   Procedure: CYSTOSCOPY;  Surgeon: Salvadore Dom, MD;  Location: [facility];  Service: Gynecology;  Laterality: N/A;  . LEEP    . LUMBAR DISC SURGERY    . NASAL SEPTUM SURGERY    . TOTAL LAPAROSCOPIC HYSTERECTOMY WITH SALPINGECTOMY N/A 05/28/2020   Procedure: TOTAL LAPAROSCOPIC HYSTERECTOMY WITH SALPINGECTOMY;  Surgeon: Salvadore Dom, MD;  Location: [facility];  Service: Gynecology;  Laterality: N/A;    Current Medications: Current Meds  Medication Sig  . acetaminophen (TYLENOL) 500 MG tablet Take 2 tablets (1,000 mg total) by mouth every 6 (six) hours.  . buprenorphine-naloxone  (SUBOXONE) 8-2 mg SUBL SL tablet Place 2 tablets under the tongue daily.  . hydrOXYzine (ATARAX/VISTARIL) 25 MG tablet Take 1-2 tablets by mouth every 8 hours as needed for severe anxiety.  . mirtazapine (REMERON) 15 MG tablet Take 1 tablet (15 mg total) by mouth at bedtime.  . ondansetron (ZOFRAN) 4 MG tablet Take 4 mg by mouth every 8 (eight) hours as needed for nausea or vomiting.   . traZODone (DESYREL) 50 MG tablet Take 0.5-1 tablets (25-50 mg total) by mouth at bedtime as needed for sleep.  . [DISCONTINUED] metoprolol succinate (TOPROL XL) 25 MG 24 hr tablet Take 1 tablet (25 mg total) by mouth daily.     Allergies:   Sulfa antibiotics   Social History   Socioeconomic History  . Marital status: Married    Spouse name: Not on file  . Number of children: Not on file  . Years of education: Not on file  . Highest education level: Not on file  Occupational History  . Not on file  Tobacco Use  . Smoking status: Former Smoker    Packs/day: 1.50    Years: 23.00    Pack years: 34.50    Types: Cigarettes    Quit date: 1998    Years since quitting: 24.1  . Smokeless tobacco: Never Used  Vaping Use  . Vaping Use: Never used  Substance and Sexual Activity  . Alcohol use: No  . Drug use: No  . Sexual activity: Yes    Birth control/protection: None  Other Topics Concern  . Not on file  Social History Narrative  . Not on file   Social Determinants of Health   Financial Resource Strain: Not on file  Food Insecurity: Not on file  Transportation Needs: Not on file  Physical Activity: Not on file  Stress: Not on file  Social Connections: Not on file     Family History: The patient's family history includes Anxiety disorder in her mother; Arthritis in her mother; Birth defects in her sister; Depression in her mother; Diabetes in her mother; Drug abuse in her brother; Healthy in her brother, brother, daughter, daughter, and sister; Heart disease in her mother; Hyperlipidemia in  her mother; Hypertension in her mother; Migraines in her sister; Thyroid disease in her mother.  ROS:   Please see the history of present illness.    All other systems reviewed and are negative.  EKGs/Labs/Other Studies Reviewed:    The following studies were reviewed today: Study Highlights  Colbie, Sliker 04/18/1981, MRN [id number]  EVENT MONITOR REPORT:   Patient was monitored from 07/01/2020 to 07/06/2018.. Indication:                    Palpitations Ordering physician:  Jenean Lindau, MD  Referring physician:            Jenean Lindau, MD    Baseline rhythm: Sinus  Minimum heart  rate: 43 BPM.  Average heart rate: 64 BPM.  Maximal heart rate 167 BPM.  Atrial arrhythmia: None significant  Ventricular arrhythmia: 1 run of 6-beat nonsustained ventricular tachycardia at an average rate of 140/min  Conduction abnormality: None significant  Symptoms: None significant   Conclusion:  Abnormal event monitoring with one run of nonsustained ventricular tachycardia lasting 6 beats  Interpreting  cardiologist: Jenean Lindau, MD  Date: 07/24/2020 3:55 PM    Recent Labs: 07/24/2020: ALT 11; BUN 29; Creatinine, Ser 0.84; Hemoglobin 14.4; Magnesium 2.4; Platelets 410; Potassium 4.9; Sodium 139; TSH 3.620  Recent Lipid Panel    Component Value Date/Time   CHOL 226 (H) 04/08/2020 1035   TRIG 91 04/08/2020 1035   HDL 36 (L) 04/08/2020 1035   CHOLHDL 6.3 (H) 04/08/2020 1035   LDLCALC 174 (H) 04/08/2020 1035    Physical Exam:    VS:  BP 100/78   Pulse 96   Ht 5\' 5"  (1.651 m)   Wt 93 lb 9.6 oz (42.5 kg)   LMP 05/06/2020 Comment: spotting   SpO2 94%   BMI 15.58 kg/m     Wt Readings from Last 3 Encounters:  08/01/20 93 lb 9.6 oz (42.5 kg)  07/25/20 93 lb (42.2 kg)  07/24/20 93 lb 11.2 oz (42.5 kg)     GEN: Patient is in no acute distress HEENT: Normal NECK: No JVD; No carotid bruits LYMPHATICS: No lymphadenopathy CARDIAC: Hear sounds regular,  2/6 systolic murmur at the apex. RESPIRATORY:  Clear to auscultation without rales, wheezing or rhonchi  ABDOMEN: Soft, non-tender, non-distended MUSCULOSKELETAL:  No edema; No deformity  SKIN: Warm and dry NEUROLOGIC:  Alert and oriented x 3 PSYCHIATRIC:  Normal affect   Signed, Jenean Lindau, MD  08/01/2020 1:10 PM    [facility]

## 2020-08-01 NOTE — Patient Instructions (Signed)
Medication Instructions:  Your physician has recommended you make the following change in your medication:  INCREASE: Metoprolol succinate to 50 mg daily   *If you need a refill on your cardiac medications before your next appointment, please call your pharmacy*   Lab Work: Your physician recommends that you return for lab work fasting 1 month: lipids, lft, bmp   If you have labs (blood work) drawn today and your tests are completely normal, you will receive your results only by: Marland Kitchen MyChart Message (if you have MyChart) OR . A paper copy in the mail If you have any lab test that is abnormal or we need to change your treatment, we will call you to review the results.   Testing/Procedures: Non-Cardiac CT scanning, (CAT scanning), is a noninvasive, special x-ray that produces cross-sectional images of the body using x-rays and a computer. CT scans help physicians diagnose and treat medical conditions. For some CT exams, a contrast material is used to enhance visibility in the area of the body being studied. CT scans provide greater clarity and reveal more details than regular x-ray exams.     Follow-Up: At [facility], you and your health needs are our priority.  As part of our continuing mission to provide you with exceptional heart care, we have created designated Provider Care Teams.  These Care Teams include your primary Cardiologist (physician) and Advanced Practice Providers (APPs -  Physician Assistants and Nurse Practitioners) who all work together to provide you with the care you need, when you need it.  We recommend signing up for the patient portal called "MyChart".  Sign up information is provided on this After Visit Summary.  MyChart is used to connect with patients for Virtual Visits (Telemedicine).  Patients are able to view lab/test results, encounter notes, upcoming appointments, etc.  Non-urgent messages can be sent to your provider as well.   To learn more about what you  can do with MyChart, go to [email or web address].    Your next appointment:   2 month(s)  The format for your next appointment:   In Person  Provider:   Jyl Heinz, MD   Other Instructions

## 2020-08-05 ENCOUNTER — Other Ambulatory Visit: Payer: Self-pay | Admitting: Physician Assistant

## 2020-08-05 DIAGNOSIS — F32A Depression, unspecified: Secondary | ICD-10-CM

## 2020-08-05 DIAGNOSIS — F419 Anxiety disorder, unspecified: Secondary | ICD-10-CM

## 2020-08-14 ENCOUNTER — Ambulatory Visit: Payer: 59 | Admitting: Physician Assistant

## 2020-08-26 ENCOUNTER — Other Ambulatory Visit: Payer: Self-pay

## 2020-08-26 ENCOUNTER — Ambulatory Visit (INDEPENDENT_AMBULATORY_CARE_PROVIDER_SITE_OTHER)
Admission: RE | Admit: 2020-08-26 | Discharge: 2020-08-26 | Disposition: A | Discharge status: Home / Self Care | Payer: Self-pay | Source: Ambulatory Visit | Attending: Cardiology | Admitting: Cardiology

## 2020-08-26 DIAGNOSIS — E782 Mixed hyperlipidemia: Secondary | ICD-10-CM

## 2020-09-04 ENCOUNTER — Ambulatory Visit (INDEPENDENT_AMBULATORY_CARE_PROVIDER_SITE_OTHER): Payer: 59 | Admitting: Physician Assistant

## 2020-09-04 ENCOUNTER — Encounter: Payer: Self-pay | Admitting: Physician Assistant

## 2020-09-04 VITALS — Temp 98.6°F | Wt 111.0 lb

## 2020-09-04 DIAGNOSIS — F1911 Other psychoactive substance abuse, in remission: Secondary | ICD-10-CM

## 2020-09-04 DIAGNOSIS — F419 Anxiety disorder, unspecified: Secondary | ICD-10-CM

## 2020-09-04 DIAGNOSIS — F32A Depression, unspecified: Secondary | ICD-10-CM

## 2020-09-04 DIAGNOSIS — G4700 Insomnia, unspecified: Secondary | ICD-10-CM

## 2020-09-04 DIAGNOSIS — R636 Underweight: Secondary | ICD-10-CM | POA: Diagnosis not present

## 2020-09-04 MED ORDER — MIRTAZAPINE 15 MG PO TABS: 15.0000 mg | ORAL_TABLET | Freq: Every day | ORAL | 0 refills | Status: DC

## 2020-09-04 MED ORDER — HYDROXYZINE HCL 25 MG PO TABS: ORAL_TABLET | ORAL | 0 refills | Status: DC

## 2020-09-04 NOTE — Progress Notes (Signed)
Telehealth office visit note for Rachel Reid, PA-C- at Primary Care at [facility]   I connected with current patient today by telephone and verified that I am speaking with the correct person   . Location of the patient: Home . Location of the provider: Office - This visit type was conducted due to national recommendations for restrictions regarding the COVID-19 Pandemic (e.g. social distancing) in an effort to limit this patient's exposure and mitigate transmission in our community.    - No physical exam could be performed with this format, beyond that communicated to [place] by the patient/ family members as noted.   - Additionally my office staff/ schedulers were to discuss with the patient that there may be a monetary charge related to this service, depending on their medical insurance.  My understanding is that patient understood and consented to proceed.     _________________________________________________________________________________   History of Present Illness: Patient calls in to follow up on mood and weight. Patient was started on mirtazapine 15 mg and tolerating medication without issues. States appetite has increased, is eating more and weight has improved too. Overall feels better and stress levels have decreased some. Continues to take hydroxyzine as needed when she gets anxious and overwhelmed. Reports with her medical issues, constant doctor visits and husband being away was not sure she could have made it without her medications. Takes Trazodone 50 mg once or twice per week which helps with sleep. Was started on Metoprolol by Cardiology and states has noticed no major flutters. Does not have a BP device so does not check BP or pulse at home. Is inquiring about primary care managing her muscle relaxer instead of pain management. Finances are a concern and states does not have cravings and does not take suboxone daily (about every 2-3 days). Takes Tylenol as needed for pain  relief.     No flowsheet data found.  Depression screen [facility] 2/9 09/04/2020 05/14/2020 04/16/2020 12/20/2019 05/19/2017  Decreased Interest 0 1 2 1  0  Down, Depressed, Hopeless 1 1 2 1 1   PHQ - 2 Score 1 2 4 2 1   Altered sleeping 1 2 3 2 2   Tired, decreased energy 2 3 3 1 3   Change in appetite 1 2 3 2 1   Feeling bad or failure about yourself  0 1 2 1 1   Trouble concentrating 1 1 2 1  0  Moving slowly or fidgety/restless 0 0 0 0 0  Suicidal thoughts 0 0 0 0 0  PHQ-9 Score 6 11 17 9 8   Difficult doing work/chores - Very difficult Very difficult Somewhat difficult Somewhat difficult      Impression and Recommendations:     1. Anxiety and depression   2. Mildly underweight adult   3. History of substance abuse ([place])   4. Insomnia, unspecified type     Anxiety and depression: -Improved. PHQ-9 score of 6, improved from prior. -Will continue current medication regimen. Provided refill. -Will continue to monitor.  Insomnia: -Improved and stable. -Continue Trazodone as needed. Provided refill.  Mildly underweight adult: -Weight has improved, patient has gained 18 pounds since last visit (there may be some variation between home and office scale). -Will continue with Mirtazapine.  History of substance use: -Discussed with patient opioids will not be prescribed by primary care. Recommend to discuss tapering of Suboxone with pain management. Advised if at any point starts having cravings should reestablish care with pain management. Reviewed prior history and patient has  been with [facility] since 2012. PDMP reviewed, no aberrancies noted. -Recommend to continue Tylenol and muscle relaxer as needed.    - As part of my medical decision making, I reviewed the following data within the [place] History obtained from pt /family, CMA notes reviewed and incorporated if applicable, Labs reviewed, Radiograph/ tests reviewed if applicable and OV notes from prior  OV's with me, as well as any other specialists she/he has seen since seeing me last, were all reviewed and used in my medical decision making process today.    - Additionally, when appropriate, discussion had with patient regarding our treatment plan, and their biases/concerns about that plan were used in my medical decision making today.    - The patient agreed with the plan and demonstrated an understanding of the instructions.   No barriers to understanding were identified.     - The patient was advised to call back or seek an in-person evaluation if the symptoms worsen or if the condition fails to improve as anticipated.   Return in about 3 months (around 12/05/2020) for Mood, Wt.    No orders of the defined types were placed in this encounter.   Meds ordered this encounter  Medications  . hydrOXYzine (ATARAX/VISTARIL) 25 MG tablet    Sig: Take 1-2 tablets by mouth every 8 hours as needed for severe anxiety.    Dispense:  90 tablet    Refill:  0    Order Specific Question:   Supervising Provider    Answer:   Beatrice Lecher D [[id number]]  . mirtazapine (REMERON) 15 MG tablet    Sig: Take 1 tablet (15 mg total) by mouth at bedtime.    Dispense:  90 tablet    Refill:  0    Order Specific Question:   Supervising Provider    Answer:   Beatrice Lecher D [[id number]]    Medications Discontinued During This Encounter  Medication Reason  . hydrOXYzine (ATARAX/VISTARIL) 25 MG tablet Reorder  . mirtazapine (REMERON) 15 MG tablet Reorder       Time spent on telephone encounter was 12 minutes.      The [place] was signed into law in 2016 which includes the topic of electronic health records.  This provides immediate access to information in MyChart.  This includes consultation notes, operative notes, office notes, lab results and pathology reports.  If you have any questions about what you read please let us know at your next visit or call us at the office.  We are  right here with you.   __________________________________________________________________________________     Patient Care Team    Relationship Specialty Notifications Start End  Rachel Bennett, [place] PCP - General Physician Assistant  12/20/19   Revankar, Reita Cliche, MD PCP - Cardiology Cardiology  05/24/20   Salvadore Dom, MD Consulting Physician Obstetrics and Gynecology  01/25/20      -Vitals obtained; medications/ allergies reconciled;  personal medical, social, Sx etc.histories were updated by CMA, reviewed by me and are reflected in chart   Patient Active Problem List   Diagnosis Date Noted  . Palpitations 07/01/2020  . Pre-diabetes   . S/P laparoscopic hysterectomy 05/28/2020  . Preoperative cardiovascular examination 05/20/2020  . Mixed dyslipidemia 05/20/2020  . Cigarette smoker 05/20/2020  . Abnormal EKG 05/20/2020  . Ovarian cyst   . Endometriosis   . Dysmenorrhea   . Depression   . Chronic pain   . Anxiety   .  Anemia   . Prediabetes   . Elevated lipids   . 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 Encounter for insertion of mirena IUD 05/19/2017  . Atypical nevi 05/19/2017  . Screening examination for STD (sexually transmitted disease) 05/19/2017  . Tobacco use 05/19/2017  . High serum low density lipoprotein (LDL) cholesterol 05/19/2017  . Healthcare maintenance 05/19/2017  . Vitamin D deficiency 05/19/2017  . Iron deficiency anemia 05/18/2017  . Chronic bilateral low back pain 03/05/2017  . Dysphagia, idiopathic 03/05/2017  . Opiate withdrawal (HCC) 03/04/2017     Current Meds  Medication Sig  . acetaminophen (TYLENOL) 500 MG tablet Take 2 tablets (1,000 mg total) by mouth every 6 (six) hours.  . buprenorphine-naloxone (SUBOXONE) 8-2 mg SUBL SL tablet Place 2 tablets under the tongue daily.  . metoprolol succinate (TOPROL XL) 50 MG 24 hr tablet Take 1 tablet (50 mg total) by mouth daily.  . ondansetron (ZOFRAN) 4 MG tablet Take 4 mg by mouth  every 8 (eight) hours as needed for nausea or vomiting.   . traZODone (DESYREL) 50 MG tablet Take 0.5-1 tablets (25-50 mg total) by mouth at bedtime as needed for sleep.  . [DISCONTINUED] hydrOXYzine (ATARAX/VISTARIL) 25 MG tablet Take 1-2 tablets by mouth every 8 hours as needed for severe anxiety.  . [DISCONTINUED] mirtazapine (REMERON) 15 MG tablet Take 1 tablet (15 mg total) by mouth at bedtime.     Allergies:  Allergies  Allergen Reactions  . Sulfa Antibiotics Itching     ROS:  See above HPI for pertinent positives and negatives   Objective:   Temperature 98.6 F (37 C), weight 111 lb (50.3 kg), last menstrual period 05/06/2020.  (if some vitals are omitted, this means that patient was UNABLE to obtain them even though they were asked to get them prior to OV today.  They were asked to call us at their earliest convenience with these once obtained. ) General: A & O * 3; sounds in no acute distress; in usual state of health.  Skin: Pt confirms warm and dry extremities and pink fingertips HEENT: Pt confirms lips non-cyanotic Chest: Patient confirms normal chest excursion and movement Respiratory: speaking in full sentences, no conversational dyspnea; patient confirms no use of accessory muscles Psych: insight appears good, mood- appears full

## 2020-09-25 ENCOUNTER — Telehealth: Payer: Self-pay | Admitting: Physician Assistant

## 2020-09-25 DIAGNOSIS — G894 Chronic pain syndrome: Secondary | ICD-10-CM

## 2020-09-26 MED ORDER — TIZANIDINE HCL 4 MG PO CAPS: 4.0000 mg | ORAL_CAPSULE | Freq: Three times a day (TID) | ORAL | 0 refills | Status: DC

## 2020-09-26 NOTE — Telephone Encounter (Signed)
Per Herb Grays ok to send for Tizanidine 4mg  TID 30 day supply. Med sent to [facility]. Pt is aware. AS, CMA

## 2020-09-27 ENCOUNTER — Ambulatory Visit: Payer: 59 | Admitting: Cardiology

## 2020-10-03 ENCOUNTER — Other Ambulatory Visit: Payer: Self-pay | Admitting: Physician Assistant

## 2020-10-03 DIAGNOSIS — G4700 Insomnia, unspecified: Secondary | ICD-10-CM

## 2020-10-03 DIAGNOSIS — F419 Anxiety disorder, unspecified: Secondary | ICD-10-CM

## 2020-10-03 DIAGNOSIS — F32A Depression, unspecified: Secondary | ICD-10-CM

## 2020-10-08 ENCOUNTER — Other Ambulatory Visit: Payer: Self-pay | Admitting: Physician Assistant

## 2020-10-08 DIAGNOSIS — F32A Depression, unspecified: Secondary | ICD-10-CM

## 2020-10-08 DIAGNOSIS — F419 Anxiety disorder, unspecified: Secondary | ICD-10-CM

## 2020-10-11 ENCOUNTER — Telehealth: Payer: Self-pay | Admitting: Physician Assistant

## 2020-10-11 ENCOUNTER — Other Ambulatory Visit: Payer: Self-pay | Admitting: Physician Assistant

## 2020-10-11 DIAGNOSIS — F32A Depression, unspecified: Secondary | ICD-10-CM

## 2020-10-11 DIAGNOSIS — F419 Anxiety disorder, unspecified: Secondary | ICD-10-CM

## 2020-10-11 DIAGNOSIS — F1910 Other psychoactive substance abuse, uncomplicated: Secondary | ICD-10-CM

## 2020-10-11 MED ORDER — BUSPIRONE HCL 10 MG PO TABS: 10.0000 mg | ORAL_TABLET | Freq: Three times a day (TID) | ORAL | 1 refills | Status: DC

## 2020-10-11 NOTE — Telephone Encounter (Signed)
Patient called office requesting refill of Hydroxyzine 25mg . Patient was given 90 tabs on 09-04-20 with directions to take 1-2 PRN severe anxiety. Patient advised she should still have plenty of medication. Pt states she has been taking this medication daily 1 tab in am and 2 tabs in pm. Patient advised this was prescribed as PRN for severe anxiety and not to be taken daily. Pt states that Herb Grays advised her she could take medication this often. Herb Grays is aware of this situation and states she did not give that advise and that patient was given same instructions that are on medication bottle. Pt then said she was taking this medication TID daily, then she states she was taking this medication PRN for anxiety and that she is literally anxious all the time. Pts history was inconsistent.  Pt has a hx of substance abuse.   Pt advised that we will not send in a refill of hydroxyzine but that we can place a referral to Psychiatry and send in Buspar 10mg  TID to help with anxiety.   Pt requesting to see Tammi Klippel [phone], whom she has seen in past.   Patient was able to contract safety to self and family and advised to seek immediate medical care and go to [facility] ED if she were to develop SI, HI, etc.  Pt verbalized understanding and was agreeable. AS, CMA

## 2020-11-06 ENCOUNTER — Ambulatory Visit (INDEPENDENT_AMBULATORY_CARE_PROVIDER_SITE_OTHER): Payer: 59

## 2020-11-06 ENCOUNTER — Ambulatory Visit
Admission: EM | Admit: 2020-11-06 | Discharge: 2020-11-06 | Disposition: A | Discharge status: Home / Self Care | Payer: 59 | Attending: Emergency Medicine | Admitting: Emergency Medicine

## 2020-11-06 ENCOUNTER — Telehealth: Payer: Self-pay | Admitting: Physician Assistant

## 2020-11-06 ENCOUNTER — Other Ambulatory Visit: Payer: Self-pay

## 2020-11-06 DIAGNOSIS — S4992XA Unspecified injury of left shoulder and upper arm, initial encounter: Secondary | ICD-10-CM | POA: Diagnosis not present

## 2020-11-06 DIAGNOSIS — Z113 Encounter for screening for infections with a predominantly sexual mode of transmission: Secondary | ICD-10-CM | POA: Diagnosis not present

## 2020-11-06 DIAGNOSIS — M25512 Pain in left shoulder: Secondary | ICD-10-CM | POA: Diagnosis not present

## 2020-11-06 MED ORDER — NAPROXEN 500 MG PO TABS: 500.0000 mg | ORAL_TABLET | Freq: Two times a day (BID) | ORAL | 0 refills | Status: DC

## 2020-11-06 MED ORDER — TIZANIDINE HCL 2 MG PO TABS: 2.0000 mg | ORAL_TABLET | Freq: Four times a day (QID) | ORAL | 0 refills | Status: DC | PRN

## 2020-11-06 NOTE — Telephone Encounter (Signed)
Patient walked in requesting if any availability for today, I advised there were no available appointments- I advised UC, patient agreed. Thank you

## 2020-11-06 NOTE — ED Provider Notes (Signed)
[facility]    CSN: [id number] Arrival date & time: 11/06/20  1200      History   Chief Complaint Chief Complaint  Patient presents with  . Shoulder Pain    HPI Rachel Bennett is a 40 y.o. female history of tobacco use, chronic back pain, presenting today for evaluation of STD screening, left shoulder pain, and episode of syncope.  Patient reports that she recently found out her husband was cheating on her over the weekend.  Would like to be screened for STDs.  Last intercourse was a couple weeks ago.  She reports that the following day after finding out this news she was reported to have multiple episodes of passing out.  Reports that she was out in the heat and is unsure if this could be correlated.  Denies any chest pain or shortness of breath.  Reports family members reported that she had slurred speech at the time, but denies discharge.  Denies repeat episodes of syncope within the past 48 hours.  She does have a headache, but declines worst headache of life.  Denies vision changes.  Patient developed left shoulder pain and decreased range of motion that she believes may have been related to falls/syncope. Denies history of DVT/PE. Denies estrogen use or birth control . Deneis leg pain/swelling.   HPI  Past Medical History:  Diagnosis Date  . Abnormal EKG 05/20/2020  . Anemia   . Anxiety   . Atypical nevi 05/19/2017  . Chronic bilateral low back pain 03/05/2017  . Chronic pain    back, hips and pelvis  . 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 Cigarette smoker 05/20/2020  . Depression   . Dysmenorrhea   . Dysphagia, idiopathic 03/05/2017  . Elevated lipids   . Encounter for insertion of mirena IUD 05/19/2017  . Endometriosis   . Healthcare maintenance 05/19/2017  . High serum low density lipoprotein (LDL) cholesterol 05/19/2017  . Iron deficiency anemia 05/18/2017  . Mixed dyslipidemia 05/20/2020  . Opiate withdrawal ([place]) 03/04/2017  . Ovarian cyst   .  Palpitations 07/01/2020  . Pre-diabetes   . Prediabetes   . Preoperative cardiovascular examination 05/20/2020  . S/P laparoscopic hysterectomy 05/28/2020  . Screening examination for STD (sexually transmitted disease) 05/19/2017  . Tobacco use 05/19/2017  . Vitamin D deficiency 05/19/2017    Patient Active Problem List   Diagnosis Date Noted  . Palpitations 07/01/2020  . Pre-diabetes   . S/P laparoscopic hysterectomy 05/28/2020  . Preoperative cardiovascular examination 05/20/2020  . Mixed dyslipidemia 05/20/2020  . Cigarette smoker 05/20/2020  . Abnormal EKG 05/20/2020  . Ovarian cyst   . Endometriosis   . Dysmenorrhea   . Depression   . Chronic pain   . Anxiety   . Anemia   . Prediabetes   . Elevated lipids   . 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 Encounter for insertion of mirena IUD 05/19/2017  . Atypical nevi 05/19/2017  . Screening examination for STD (sexually transmitted disease) 05/19/2017  . Tobacco use 05/19/2017  . High serum low density lipoprotein (LDL) cholesterol 05/19/2017  . Healthcare maintenance 05/19/2017  . Vitamin D deficiency 05/19/2017  . Iron deficiency anemia 05/18/2017  . Chronic bilateral low back pain 03/05/2017  . Dysphagia, idiopathic 03/05/2017  . Opiate withdrawal ([place]) 03/04/2017    Past Surgical History:  Procedure Laterality Date  . CYSTOSCOPY N/A 05/28/2020   Procedure: CYSTOSCOPY;  Surgeon: Salvadore Dom, MD;  Location: [facility];  Service:  Gynecology;  Laterality: N/A;  . LEEP    . LUMBAR DISC SURGERY    . NASAL SEPTUM SURGERY    . TOTAL LAPAROSCOPIC HYSTERECTOMY WITH SALPINGECTOMY N/A 05/28/2020   Procedure: TOTAL LAPAROSCOPIC HYSTERECTOMY WITH SALPINGECTOMY;  Surgeon: Salvadore Dom, MD;  Location: [facility];  Service: Gynecology;  Laterality: N/A;    OB History    Gravida  2   Para  2   Term  2   Preterm      AB      Living  2     SAB      IAB       Ectopic      Multiple      Live Births  2            Home Medications    Prior to Admission medications   Medication Sig Start Date End Date Taking? Authorizing Provider  naproxen (NAPROSYN) 500 MG tablet Take 1 tablet (500 mg total) by mouth 2 (two) times daily. 11/06/20  Yes Jaiya Mooradian C, PA-C  tiZANidine (ZANAFLEX) 2 MG tablet Take 1-2 tablets (2-4 mg total) by mouth every 6 (six) hours as needed for muscle spasms. 11/06/20  Yes Finnley Larusso C, PA-C  acetaminophen (TYLENOL) 500 MG tablet Take 2 tablets (1,000 mg total) by mouth every 6 (six) hours. 05/28/20   Salvadore Dom, MD  busPIRone (BUSPAR) 10 MG tablet Take 1 tablet (10 mg total) by mouth 3 (three) times daily. 10/11/20   Lorrene Reid, PA-C  metoprolol succinate (TOPROL XL) 50 MG 24 hr tablet Take 1 tablet (50 mg total) by mouth daily. 08/01/20   Revankar, Reita Cliche, MD  mirtazapine (REMERON) 15 MG tablet Take 1 tablet (15 mg total) by mouth at bedtime. 09/04/20   Lorrene Reid, PA-C    Family History Family History  Problem Relation Age of Onset  . Arthritis Mother   . Anxiety disorder Mother   . Depression Mother   . Diabetes Mother   . Heart disease Mother   . Hyperlipidemia Mother   . Hypertension Mother   . Thyroid disease Mother   . Birth defects Sister   . Migraines Sister   . Healthy Brother   . Healthy Daughter   . Healthy Sister   . Drug abuse Brother   . Healthy Brother   . Healthy Daughter     Social History Social History   Tobacco Use  . Smoking status: Current Every Day Smoker    Packs/day: 1.50    Years: 23.00    Pack years: 34.50    Types: Cigarettes    Last attempt to quit: 1998    Years since quitting: 24.3  . Smokeless tobacco: Never Used  Vaping Use  . Vaping Use: Never used  Substance Use Topics  . Alcohol use: No  . Drug use: No     Allergies   Sulfa antibiotics   Review of Systems Review of Systems  Constitutional: Negative for fatigue and fever.   HENT: Negative for congestion, sinus pressure and sore throat.   Eyes: Negative for photophobia, pain and visual disturbance.  Respiratory: Negative for cough and shortness of breath.   Cardiovascular: Negative for chest pain.  Gastrointestinal: Positive for nausea. Negative for abdominal pain and vomiting.  Genitourinary: Negative for decreased urine volume and hematuria.  Musculoskeletal: Positive for arthralgias. Negative for myalgias, neck pain and neck stiffness.  Neurological: Positive for light-headedness and headaches. Negative for dizziness, syncope, facial  asymmetry, speech difficulty, weakness and numbness.     Physical Exam Triage Vital Signs ED Triage Vitals  Enc Vitals Group     BP 11/06/20 1211 (!) 140/105     Pulse Rate 11/06/20 1211 (!) 103     Resp 11/06/20 1211 18     Temp 11/06/20 1211 98.6 F (37 C)     Temp Source 11/06/20 1211 Oral     SpO2 11/06/20 1211 97 %     Weight --      Height --      Head Circumference --      Peak Flow --      Pain Score 11/06/20 1212 5     Pain Loc --      Pain Edu? --      Excl. in [place]? --    No data found.  Updated Vital Signs BP (!) 140/105 (BP Location: Right Arm)   Pulse (!) 103   Temp 98.6 F (37 C) (Oral)   Resp 18   LMP 05/06/2020 Comment: spotting   SpO2 97%   Visual Acuity Right Eye Distance:   Left Eye Distance:   Bilateral Distance:    Right Eye Near:   Left Eye Near:    Bilateral Near:     Physical Exam Vitals and nursing note reviewed.  Constitutional:      Appearance: She is well-developed.     Comments: No acute distress  HENT:     Head: Normocephalic and atraumatic.     Comments: No facial swelling, no facial asymmetry    Ears:     Comments: No hemotympanum    Nose: Nose normal.     Mouth/Throat:     Comments: Poor dentition, palate elevates symmetrically Eyes:     Extraocular Movements: Extraocular movements intact.     Conjunctiva/sclera: Conjunctivae normal.     Pupils: Pupils  are equal, round, and reactive to light.  Cardiovascular:     Rate and Rhythm: Normal rate and regular rhythm.  Pulmonary:     Effort: Pulmonary effort is normal. No respiratory distress.     Comments: Breathing comfortably at rest, CTABL, no wheezing, rales or other adventitious sounds auscultated Abdominal:     General: There is no distension.  Musculoskeletal:        General: Normal range of motion.     Cervical back: Neck supple.     Comments: Left shoulder: Nontender to palpation along clavicle, tender to [facility] joint and anterior shoulder over biceps tendon, tenderness to palpation to periscapular thoracic musculature, limited active range of motion beyond 90 degrees abduction, strength on left shoulder 4/5 compared to right, radial pulse 2+  Skin:    General: Skin is warm and dry.  Neurological:     Mental Status: She is alert and oriented to person, place, and time.      UC Treatments / Results  Labs (all labs ordered are listed, but only abnormal results are displayed) Labs Reviewed  HIV ANTIBODY (ROUTINE TESTING W REFLEX)  RPR  CERVICOVAGINAL ANCILLARY ONLY    EKG   Radiology DG Shoulder Left  Result Date: 11/06/2020 CLINICAL DATA:  Pain and limited range of motion EXAM: LEFT SHOULDER - 2+ VIEW COMPARISON:  None. FINDINGS: Frontal, oblique, and Y scapular images were obtained. No fracture or dislocation. The joint spaces appear normal. No erosive change. Visualized left lung clear. IMPRESSION: No fracture or dislocation.  No evident arthropathy. Electronically Signed   By: Lowella Grip III M.D.  On: 11/06/2020 13:09    Procedures Procedures (including critical care time)  Medications Ordered in UC Medications - No data to display  Initial Impression / Assessment and Plan / UC Course  I have reviewed the triage vital signs and the nursing notes.  Pertinent labs & imaging results that were available during my care of the patient were reviewed by me and  considered in my medical decision making (see chart for details).     X-ray negative for acute fracture, suspect left arm weakness likely from pain rather than true weakness, lower extremities equal, no other neurodeficits, no repeat episodes of syncope or progressive symptoms over the past 48 hours.  Discussed unable to rule out underlying central cause of syncope or CVA.  Advised to go to emergency room if any symptoms present or worsening.  Screening for STD with vaginal swab and blood work for HIV and syphilis.  Call with results and provide further treatment as needed.  Recommending Naprosyn and tizanidine for shoulder gentle range of motion exercises and monitor for gradual resolution.  Discussed strict return precautions. Patient verbalized understanding and is agreeable with plan.  Final Clinical Impressions(s) / UC Diagnoses   Final diagnoses:  Screen for STD (sexually transmitted disease)  Injury of left shoulder, initial encounter     Discharge Instructions     Screening for gonorrhea, chlamydia, trichomonas, HIV and syphilis-we will call with results if abnormal and provide treatment  X-ray for shoulder does not show any signs of fracture Please use Naprosyn twice daily with food Supplement tizanidine at home/bedtime-this may cause drowsiness, do not drive or work after taking Gentle range of motion exercises-see attached If shoulder not returning to normal range of motion please follow-up with sports medicine  If you develop any worsening headache, vision changes, difficulty speaking, one-sided weakness, numbness or tingling, dizziness, lightheadedness, syncope please go to emergency room for further evaluation    ED Prescriptions    Medication Sig Dispense Auth. Provider   naproxen (NAPROSYN) 500 MG tablet Take 1 tablet (500 mg total) by mouth 2 (two) times daily. 30 tablet Annet Manukyan C, PA-C   tiZANidine (ZANAFLEX) 2 MG tablet Take 1-2 tablets (2-4 mg total) by  mouth every 6 (six) hours as needed for muscle spasms. 30 tablet Asad Keeven, [place] C, PA-C     PDMP not reviewed this encounter.   Janith Lima, [place] 11/06/20 1531

## 2020-11-06 NOTE — Discharge Instructions (Addendum)
Screening for gonorrhea, chlamydia, trichomonas, HIV and syphilis-we will call with results if abnormal and provide treatment  X-ray for shoulder does not show any signs of fracture Please use Naprosyn twice daily with food Supplement tizanidine at home/bedtime-this may cause drowsiness, do not drive or work after taking Gentle range of motion exercises-see attached If shoulder not returning to normal range of motion please follow-up with sports medicine  If you develop any worsening headache, vision changes, difficulty speaking, one-sided weakness, numbness or tingling, dizziness, lightheadedness, syncope please go to emergency room for further evaluation

## 2020-11-06 NOTE — ED Triage Notes (Signed)
Pt states just found out her husband is cheating on her, requesting STD and HIV testing. States after this she passed out 4 time on Sunday. States having lt shoulder pain, headaches, and anxious since Monday. States her family said she had slurred speech and talking weird. Pt speaking in complete sentences. Neg drift and no facial droop noted. Pt ambulatory with no problems.

## 2020-11-07 LAB — CERVICOVAGINAL ANCILLARY ONLY
Bacterial Vaginitis (gardnerella): POSITIVE — AB
Candida Glabrata: NEGATIVE
Candida Vaginitis: NEGATIVE
Chlamydia: NEGATIVE
Comment: NEGATIVE
Comment: NEGATIVE
Comment: NEGATIVE
Comment: NEGATIVE
Comment: NEGATIVE
Comment: NORMAL
Neisseria Gonorrhea: NEGATIVE
Trichomonas: NEGATIVE

## 2020-11-07 LAB — HIV ANTIBODY (ROUTINE TESTING W REFLEX): HIV Screen 4th Generation wRfx: NONREACTIVE

## 2020-11-07 LAB — RPR: RPR Ser Ql: NONREACTIVE

## 2020-11-08 ENCOUNTER — Telehealth (HOSPITAL_COMMUNITY): Payer: Self-pay | Admitting: Emergency Medicine

## 2020-11-08 MED ORDER — METRONIDAZOLE 500 MG PO TABS: 500.0000 mg | ORAL_TABLET | Freq: Two times a day (BID) | ORAL | 0 refills | Status: DC

## 2020-11-13 ENCOUNTER — Ambulatory Visit (INDEPENDENT_AMBULATORY_CARE_PROVIDER_SITE_OTHER): Payer: 59 | Admitting: Family Medicine

## 2020-11-13 ENCOUNTER — Encounter: Payer: Self-pay | Admitting: Family Medicine

## 2020-11-13 ENCOUNTER — Other Ambulatory Visit: Payer: Self-pay

## 2020-11-13 ENCOUNTER — Ambulatory Visit: Payer: Self-pay

## 2020-11-13 VITALS — BP 108/76 | Ht 65.5 in | Wt 110.0 lb

## 2020-11-13 DIAGNOSIS — M25512 Pain in left shoulder: Secondary | ICD-10-CM

## 2020-11-13 NOTE — Patient Instructions (Signed)
You have strained and bruised your rotator cuff with secondary trapezius strain. Try to avoid painful activities (overhead activities, lifting with extended arm) as much as possible. Continue the naproxen twice a day with food - if you run out of this you can take 2 tabs of aleve twice a day with food instead. Can take tylenol in addition to this. Icing 15 minutes at a time 3-4 times a day as needed. Consider physical therapy with transition to home exercise program. Start the motion exercises and do these twice a day Do home exercise program with theraband and scapular stabilization exercises daily 3 sets of 10 once a day starting in about a week. If not improving at follow-up we will consider further imaging, injection, physical therapy, and/or nitro patches. Follow up with me in 1 month.

## 2020-11-14 NOTE — Progress Notes (Signed)
PCP: Lorrene Reid, PA-C  Subjective:   HPI: Patient is a 40 y.o. female here for left shoulder injury.  Patient reports about 1.5 weeks ago went to ED following syncopal episodes which led to left shoulder injury and pain. Evaluated there and referred to [place] for left shoulder. No prior issues with this shoulder. Pain superior, anterior, and posterior. No swelling or bruising. She is right handed. Tried naprosyn, muscle relaxant, and some basic motion exercises. + night pain.  Past Medical History:  Diagnosis Date  . Abnormal EKG 05/20/2020  . Anemia   . Anxiety   . Atypical nevi 05/19/2017  . Chronic bilateral low back pain 03/05/2017  . Chronic pain    back, hips and pelvis  . 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 Cigarette smoker 05/20/2020  . Depression   . Dysmenorrhea   . Dysphagia, idiopathic 03/05/2017  . Elevated lipids   . Encounter for insertion of mirena IUD 05/19/2017  . Endometriosis   . Healthcare maintenance 05/19/2017  . High serum low density lipoprotein (LDL) cholesterol 05/19/2017  . Iron deficiency anemia 05/18/2017  . Mixed dyslipidemia 05/20/2020  . Opiate withdrawal ([place]) 03/04/2017  . Ovarian cyst   . Palpitations 07/01/2020  . Pre-diabetes   . Prediabetes   . Preoperative cardiovascular examination 05/20/2020  . S/P laparoscopic hysterectomy 05/28/2020  . Screening examination for STD (sexually transmitted disease) 05/19/2017  . Tobacco use 05/19/2017  . Vitamin D deficiency 05/19/2017    Current Outpatient Medications on File Prior to Visit  Medication Sig Dispense Refill  . busPIRone (BUSPAR) 10 MG tablet Take 1 tablet (10 mg total) by mouth 3 (three) times daily. 90 tablet 1  . metoprolol succinate (TOPROL XL) 50 MG 24 hr tablet Take 1 tablet (50 mg total) by mouth daily. 90 tablet 1  . metroNIDAZOLE (FLAGYL) 500 MG tablet Take 1 tablet (500 mg total) by mouth 2 (two) times daily. 14 tablet 0  . mirtazapine (REMERON) 15 MG tablet  Take 1 tablet (15 mg total) by mouth at bedtime. 90 tablet 0  . naproxen (NAPROSYN) 500 MG tablet Take 1 tablet (500 mg total) by mouth 2 (two) times daily. 30 tablet 0  . tiZANidine (ZANAFLEX) 2 MG tablet Take 1-2 tablets (2-4 mg total) by mouth every 6 (six) hours as needed for muscle spasms. 30 tablet 0  . acetaminophen (TYLENOL) 500 MG tablet Take 2 tablets (1,000 mg total) by mouth every 6 (six) hours. (Patient not taking: Reported on 11/13/2020) 30 tablet 0   No current facility-administered medications on file prior to visit.    Past Surgical History:  Procedure Laterality Date  . CYSTOSCOPY N/A 05/28/2020   Procedure: CYSTOSCOPY;  Surgeon: Salvadore Dom, MD;  Location: [facility];  Service: Gynecology;  Laterality: N/A;  . LEEP    . LUMBAR DISC SURGERY    . NASAL SEPTUM SURGERY    . TOTAL LAPAROSCOPIC HYSTERECTOMY WITH SALPINGECTOMY N/A 05/28/2020   Procedure: TOTAL LAPAROSCOPIC HYSTERECTOMY WITH SALPINGECTOMY;  Surgeon: Salvadore Dom, MD;  Location: [facility];  Service: Gynecology;  Laterality: N/A;    Allergies  Allergen Reactions  . Sulfa Antibiotics Itching    Social History   Socioeconomic History  . Marital status: Married    Spouse name: Not on file  . Number of children: Not on file  . Years of education: Not on file  . Highest education level: Not on file  Occupational History  . Not on file  Tobacco Use  . Smoking status: Current Every Day Smoker    Packs/day: 1.50    Years: 23.00    Pack years: 34.50    Types: Cigarettes    Last attempt to quit: 1998    Years since quitting: 24.4  . Smokeless tobacco: Never Used  Vaping Use  . Vaping Use: Never used  Substance and Sexual Activity  . Alcohol use: No  . Drug use: No  . Sexual activity: Yes    Birth control/protection: None  Other Topics Concern  . Not on file  Social History Narrative  . Not on file   Social Determinants of Health   Financial  Resource Strain: Not on file  Food Insecurity: Not on file  Transportation Needs: Not on file  Physical Activity: Not on file  Stress: Not on file  Social Connections: Not on file  Intimate Partner Violence: Not on file    Family History  Problem Relation Age of Onset  . Arthritis Mother   . Anxiety disorder Mother   . Depression Mother   . Diabetes Mother   . Heart disease Mother   . Hyperlipidemia Mother   . Hypertension Mother   . Thyroid disease Mother   . Birth defects Sister   . Migraines Sister   . Healthy Brother   . Healthy Daughter   . Healthy Sister   . Drug abuse Brother   . Healthy Brother   . Healthy Daughter     BP 108/76   Ht 5' 5.5" (1.664 m)   Wt 110 lb (49.9 kg)   LMP 05/06/2020 Comment: spotting   BMI 18.03 kg/m   No flowsheet data found.  No flowsheet data found.  Review of Systems: See HPI above.     Objective:  Physical Exam:  Gen: NAD, comfortable in exam room  Left shoulder: No swelling, ecchymoses.  No gross deformity. No TTP AC joint, biceps tendon.  Tenderness within trapezius, lateral shoulder distal to acromion. FROM but with painful arc and pain beyond 90 degrees flexion and abduction. Positive Hawkins, Neers. Negative Yergasons. Strength 5/5 with empty can and resisted internal/external rotation.  Mild pain empty can. NV intact distally.  Complete MSK u/s left shoulder: Biceps tendon: intact on long and short views Pec major tendon: intact Subscapularis: intact without abnormalities AC joint: no effusion or separation Infraspinatus: intact without abnormalities Supraspinatus: intact without abnormalities Posterior glenohumeral joint: no effusion.  Posterior labrum normal  Impression: normal ultrasound of left shoulder   Assessment & Plan:  1. Left shoulder injury - consistent with strain/contusion of rotator cuff with secondary trapezius strain.  Naproxen, tylenol, icing, motion exercises reviewed.  Start  strengthening exercises including scapular strengthening in 1 week.  F/u in 1 month.  Consider additional imaging, injection, physical therapy, nitro patches if not improving.

## 2020-12-05 ENCOUNTER — Other Ambulatory Visit: Payer: Self-pay | Admitting: Physician Assistant

## 2020-12-05 DIAGNOSIS — G894 Chronic pain syndrome: Secondary | ICD-10-CM

## 2020-12-10 ENCOUNTER — Ambulatory Visit (INDEPENDENT_AMBULATORY_CARE_PROVIDER_SITE_OTHER): Payer: 59 | Admitting: Cardiology

## 2020-12-10 ENCOUNTER — Encounter: Payer: Self-pay | Admitting: Cardiology

## 2020-12-10 ENCOUNTER — Other Ambulatory Visit: Payer: Self-pay

## 2020-12-10 VITALS — BP 122/64 | HR 120 | Ht 65.6 in | Wt 102.8 lb

## 2020-12-10 DIAGNOSIS — R002 Palpitations: Secondary | ICD-10-CM | POA: Diagnosis not present

## 2020-12-10 DIAGNOSIS — E785 Hyperlipidemia, unspecified: Secondary | ICD-10-CM

## 2020-12-10 DIAGNOSIS — I472 Ventricular tachycardia: Secondary | ICD-10-CM | POA: Insufficient documentation

## 2020-12-10 DIAGNOSIS — I4729 Other ventricular tachycardia: Secondary | ICD-10-CM

## 2020-12-10 DIAGNOSIS — E782 Mixed hyperlipidemia: Secondary | ICD-10-CM | POA: Diagnosis not present

## 2020-12-10 DIAGNOSIS — F17210 Nicotine dependence, cigarettes, uncomplicated: Secondary | ICD-10-CM | POA: Diagnosis not present

## 2020-12-10 HISTORY — DX: Other ventricular tachycardia: I47.29

## 2020-12-10 MED ORDER — IVABRADINE HCL 5 MG PO TABS: 5.0000 mg | ORAL_TABLET | Freq: Two times a day (BID) | ORAL | 3 refills | Status: DC

## 2020-12-10 NOTE — Progress Notes (Signed)
Cardiology Office Note:    Date:  12/10/2020   ID:  Rachel Bennett, DOB Mar 02, 1981, MRN [id number]  PCP:  Lorrene Reid, PA-C  Cardiologist:  Jenean Lindau, MD   Referring MD: Lorrene Reid, PA-C    ASSESSMENT:    No diagnosis found. PLAN:    In order of problems listed above:  Palpitations: I discussed my findings with the patient at length.  I told of relaxation techniques and that she needs to get in touch with her primary care provider to address her anxiety issues.  In the interim because of tachycardia I will initiate her on Corlanor.  She will keep a track of pulse and blood pressures and get back to [place]. Cigarette smoker: I counseled her on the risks of smoking and she understands and promises to quit Mixed dyslipidemia: Followed by primary care.  Diet was emphasized. Nonsustained ventricular tachycardia: Medical management at this time.  On beta-blocker.  Stress test negative. Patient will be seen in follow-up appointment in 6 months or earlier if the patient has any concerns    Medication Adjustments/Labs and Tests Ordered: Current medicines are reviewed at length with the patient today.  Concerns regarding medicines are outlined above.  No orders of the defined types were placed in this encounter.  No orders of the defined types were placed in this encounter.    No chief complaint on file.    History of Present Illness:    Rachel Bennett is a 40 y.o. female.  Patient was evaluated by me for palpitations.  She is on a beta-blocker.  She mentions to me that she is going through significantly stressful times on her domestic front.  No chest pain orthopnea or PND.  She has significant palpitations stable on beta-blocker.  At the time of my evaluation, the patient is alert awake oriented and in no distress.  Unfortunately she continues to smoke.  Past Medical History:  Diagnosis Date   Abnormal EKG 05/20/2020   Anemia    Anxiety    Atypical nevi 05/19/2017    Chronic bilateral low back pain 03/05/2017   Chronic pain    back, hips and pelvis   Chronic radicular lumbar pain 05/19/2017   Cigarette smoker 05/20/2020   Depression    Dysmenorrhea    Dysphagia, idiopathic 03/05/2017   Elevated lipids    Encounter for insertion of mirena IUD 05/19/2017   Endometriosis    Healthcare maintenance 05/19/2017   High serum low density lipoprotein (LDL) cholesterol 05/19/2017   Iron deficiency anemia 05/18/2017   Mixed dyslipidemia 05/20/2020   Opiate withdrawal ([place]) 03/04/2017   Ovarian cyst    Palpitations 07/01/2020   Pre-diabetes    Prediabetes    Preoperative cardiovascular examination 05/20/2020   S/P laparoscopic hysterectomy 05/28/2020   Screening examination for STD (sexually transmitted disease) 05/19/2017   Tobacco use 05/19/2017   Vitamin D deficiency 05/19/2017    Past Surgical History:  Procedure Laterality Date   CYSTOSCOPY N/A 05/28/2020   Procedure: CYSTOSCOPY;  Surgeon: Salvadore Dom, MD;  Location: [place];  Service: Gynecology;  Laterality: N/A;   LEEP     LUMBAR DISC SURGERY     NASAL SEPTUM SURGERY     TOTAL LAPAROSCOPIC HYSTERECTOMY WITH SALPINGECTOMY N/A 05/28/2020   Procedure: TOTAL LAPAROSCOPIC HYSTERECTOMY WITH SALPINGECTOMY;  Surgeon: Salvadore Dom, MD;  Location: [facility];  Service: Gynecology;  Laterality: N/A;    Current Medications: Current Meds  Medication Sig  acetaminophen (TYLENOL) 500 MG tablet Take 500 mg by mouth every 6 (six) hours as needed for mild pain or headache.   busPIRone (BUSPAR) 10 MG tablet Take 1 tablet (10 mg total) by mouth 3 (three) times daily.   metoprolol succinate (TOPROL XL) 50 MG 24 hr tablet Take 1 tablet (50 mg total) by mouth daily.   mirtazapine (REMERON) 15 MG tablet Take 1 tablet (15 mg total) by mouth at bedtime.     Allergies:   Sulfa antibiotics   Social History   Socioeconomic History   Marital status: Married     Spouse name: Not on file   Number of children: Not on file   Years of education: Not on file   Highest education level: Not on file  Occupational History   Not on file  Tobacco Use   Smoking status: Every Day    Packs/day: 1.50    Years: 23.00    Pack years: 34.50    Types: Cigarettes    Last attempt to quit: 1998    Years since quitting: 24.4   Smokeless tobacco: Never  Vaping Use   Vaping Use: Never used  Substance and Sexual Activity   Alcohol use: No   Drug use: No   Sexual activity: Yes    Birth control/protection: None  Other Topics Concern   Not on file  Social History Narrative   Not on file   Social Determinants of Health   Financial Resource Strain: Not on file  Food Insecurity: Not on file  Transportation Needs: Not on file  Physical Activity: Not on file  Stress: Not on file  Social Connections: Not on file     Family History: The patient's family history includes Anxiety disorder in her mother; Arthritis in her mother; Birth defects in her sister; Depression in her mother; Diabetes in her mother; Drug abuse in her brother; Healthy in her brother, brother, daughter, daughter, and sister; Heart disease in her mother; Hyperlipidemia in her mother; Hypertension in her mother; Migraines in her sister; Thyroid disease in her mother.  ROS:   Please see the history of present illness.    All other systems reviewed and are negative.  EKGs/Labs/Other Studies Reviewed:    The following studies were reviewed today:  IMPRESSION: Coronary calcium score of 0. This was 0 percentile for age and sex matched control.   Skeet Latch, MD     Electronically Signed   By: Skeet Latch   On: 08/26/2020 16:50   Recent Labs: 07/24/2020: ALT 11; BUN 29; Creatinine, Ser 0.84; Hemoglobin 14.4; Magnesium 2.4; Platelets 410; Potassium 4.9; Sodium 139; TSH 3.620  Recent Lipid Panel    Component Value Date/Time   CHOL 226 (H) 04/08/2020 1035   TRIG 91 04/08/2020  1035   HDL 36 (L) 04/08/2020 1035   CHOLHDL 6.3 (H) 04/08/2020 1035   LDLCALC 174 (H) 04/08/2020 1035    Physical Exam:    VS:  BP 122/64   Pulse (!) 120   Ht 5' 5.6" (1.666 m)   Wt 102 lb 12.8 oz (46.6 kg)   LMP 05/06/2020 Comment: spotting   SpO2 95%   BMI 16.80 kg/m     Wt Readings from Last 3 Encounters:  12/10/20 102 lb 12.8 oz (46.6 kg)  11/13/20 110 lb (49.9 kg)  09/04/20 111 lb (50.3 kg)     GEN: Patient is in no acute distress HEENT: Normal NECK: No JVD; No carotid bruits LYMPHATICS: No lymphadenopathy CARDIAC: Hear sounds  regular, 2/6 systolic murmur at the apex. RESPIRATORY:  Clear to auscultation without rales, wheezing or rhonchi  ABDOMEN: Soft, non-tender, non-distended MUSCULOSKELETAL:  No edema; No deformity  SKIN: Warm and dry NEUROLOGIC:  Alert and oriented x 3 PSYCHIATRIC:  Normal affect   Signed, Jenean Lindau, MD  12/10/2020 3:15 PM    [facility]

## 2020-12-10 NOTE — Patient Instructions (Addendum)
Medication Instructions:  Your physician has recommended you make the following change in your medication:   Start Corlanor 5 mg twice daily.  *If you need a refill on your cardiac medications before your next appointment, please call your pharmacy*   Lab Work: None ordered If you have labs (blood work) drawn today and your tests are completely normal, you will receive your results only by: [place] (if you have MyChart) OR A paper copy in the mail If you have any lab test that is abnormal or we need to change your treatment, we will call you to review the results.   Testing/Procedures: None ordered   Follow-Up: At [facility], you and your health needs are our priority.  As part of our continuing mission to provide you with exceptional heart care, we have created designated Provider Care Teams.  These Care Teams include your primary Cardiologist (physician) and Advanced Practice Providers (APPs -  Physician Assistants and Nurse Practitioners) who all work together to provide you with the care you need, when you need it.  We recommend signing up for the patient portal called "MyChart".  Sign up information is provided on this After Visit Summary.  MyChart is used to connect with patients for Virtual Visits (Telemedicine).  Patients are able to view lab/test results, encounter notes, upcoming appointments, etc.  Non-urgent messages can be sent to your provider as well.   To learn more about what you can do with MyChart, go to [email or web address].    Your next appointment:   4 month(s)  The format for your next appointment:   In Person  Provider:   Jyl Heinz, MD   Other Instructions Ivabradine tablets What is this medication? IVABRADINE (eye VAB ra deen) is used for heart failure. This medicine may be used for other purposes; ask your health care provider orpharmacist if you have questions. COMMON BRAND NAME(S): Corlanor What should I tell my care team  before I take this medication? They need to know if you have any of these conditions: certain heart conditions like sick sinus syndrome, sinoatrial block, or third-degree atrioventricular block heart failure that has recently worsened liver disease low blood pressure low resting heart rate pacemaker an unusual or allergic reaction to ivabradine, other medicines, foods, dyes, or preservatives pregnant or trying to get pregnant breast-feeding How should I use this medication? Take this medicine by mouth with a glass of water. Follow the directions on the prescription label. Take this medicine with food. Avoid grapefruit juice. Take your medicine at regular intervals. Do not take it more often than directed. Donot stop taking except on your doctor's advice. Talk to your pediatrician regarding the use of this medicine in children. While this drug may be prescribed for children as young as 6 months for selectedconditions, precautions do apply. Overdosage: If you think you have taken too much of this medicine contact apoison control center or emergency room at once. NOTE: This medicine is only for you. Do not share this medicine with others. What if I miss a dose? If you miss a dose, skip it. Take your next dose at the normal time. Do nottake extra or 2 doses at the same time to make up for the missed dose. What may interact with this medication? Do not take this medicine with any of the following medications: ceritinib certain antibiotics like chloramphenicol, clarithromycin, telithromycin certain antivirals for HIV or hepatitis certain medicines for fungal infections like ketoconazole, itraconazole, posaconazole, or voriconazole idelalisib lonafarnib nefazodone ribociclib tucatinib  This medicine may also interact with the following medications: certain medicines for blood pressure, heart disease, irregular heartbeat certain medicines for seizures like phenobarbital and  phenytoin rifampin St. John's wort This list may not describe all possible interactions. Give your health care provider a list of all the medicines, herbs, non-prescription drugs, or dietary supplements you use. Also tell them if you smoke, drink alcohol, or use illegaldrugs. Some items may interact with your medicine. What should I watch for while using this medication? Visit your healthcare professional for regular checks on your progress. Tell your healthcare professional if your symptoms do not start to get better or ifthey get worse. You may experience changes in vision. Use caution if you are driving or using machinery when sudden changes in light intensity may occur, especially while driving at night. This effect may decrease after using this medicine for a longtime. Do not become pregnant while taking this medicine. Women should inform their healthcare professional if they wish to become pregnant or think they might be pregnant. There is a potential for serious side effects and harm to an unbornchild. Talk to your health care professional for more information. What side effects may I notice from receiving this medication? Side effects that you should report to your doctor or health care professionalas soon as possible: allergic reactions like skin rash, itching or hives, swelling of the face, lips, or tongue high blood pressure signs and symptoms of a dangerous change in heartbeat or heart rhythm like chest pain; dizziness; fast or irregular heartbeat; palpitations; feeling faint or lightheaded; breathing problems unusually slow heartbeat Side effects that usually do not require medical attention (report to yourdoctor or health care professional if they continue or are bothersome): changes in vision This list may not describe all possible side effects. Call your doctor for medical advice about side effects. You may report side effects to FDA at1-800-FDA-1088. Where should I keep my  medication? Keep out of the reach of children. Store at room temperature between 20 and 25 degrees C (68 and 77 degrees F).Throw away any unused medicine after the expiration date. NOTE: This sheet is a summary. It may not cover all possible information. If you have questions about this medicine, talk to your doctor, pharmacist, orhealth care provider.  2022 Elsevier/Gold Standard (2019-10-09 14:56:54)

## 2020-12-11 ENCOUNTER — Telehealth: Payer: Self-pay | Admitting: Cardiology

## 2020-12-11 NOTE — Telephone Encounter (Signed)
Pt c/o medication issue:  1. Name of Medication: ivabradine (CORLANOR) 5 MG TABS tablet  2. How are you currently taking this medication (dosage and times per day)?   3. Are you having a reaction (difficulty breathing--STAT)?   4. What is your medication issue?   Patient states during her visit on 12/10/20 she was given instructions to register for a co-pay card. When she went online to register, she states she was unable to complete the first question because it won't allow her to select an answer. She would like to know if someone can assist her or if there is another option. Please advise.

## 2020-12-11 NOTE — Telephone Encounter (Signed)
Assisted pt with corlanor co pay card.

## 2020-12-13 ENCOUNTER — Telehealth: Payer: Self-pay | Admitting: Cardiology

## 2020-12-13 NOTE — Telephone Encounter (Signed)
Patient called to say that her insurance is not covering the medication. Is wondering if there is one not expensive or if they are any samples she can get. Please advise

## 2020-12-13 NOTE — Telephone Encounter (Signed)
Patient calling back. She states she wants to know if there is another less expensive medication she can take.

## 2020-12-16 ENCOUNTER — Ambulatory Visit: Payer: 59 | Admitting: Family Medicine

## 2020-12-18 NOTE — Telephone Encounter (Signed)
Spoke with pt who states that she has bought the first month of medication but it was $575.00. We spoke wo the Corlanor rep and there is not assistance for medication. Pt will keep a record of her BP and heart rate while on the medication and we will see if we can do medical necessity paper for insurance.

## 2020-12-18 NOTE — Telephone Encounter (Signed)
Left vm for pt to callback 

## 2020-12-25 ENCOUNTER — Ambulatory Visit: Payer: 59 | Admitting: Cardiology

## 2021-01-07 ENCOUNTER — Other Ambulatory Visit: Payer: Self-pay

## 2021-01-07 ENCOUNTER — Other Ambulatory Visit: Payer: Self-pay | Admitting: Physician Assistant

## 2021-01-07 ENCOUNTER — Ambulatory Visit (INDEPENDENT_AMBULATORY_CARE_PROVIDER_SITE_OTHER): Payer: 59 | Admitting: Physician Assistant

## 2021-01-07 ENCOUNTER — Encounter: Payer: Self-pay | Admitting: Physician Assistant

## 2021-01-07 VITALS — BP 115/79 | HR 91 | Temp 98.2°F | Ht 65.5 in | Wt 104.0 lb

## 2021-01-07 DIAGNOSIS — F439 Reaction to severe stress, unspecified: Secondary | ICD-10-CM | POA: Diagnosis not present

## 2021-01-07 DIAGNOSIS — F32A Depression, unspecified: Secondary | ICD-10-CM

## 2021-01-07 DIAGNOSIS — F419 Anxiety disorder, unspecified: Secondary | ICD-10-CM | POA: Diagnosis not present

## 2021-01-07 DIAGNOSIS — M25562 Pain in left knee: Secondary | ICD-10-CM | POA: Diagnosis not present

## 2021-01-07 DIAGNOSIS — Z113 Encounter for screening for infections with a predominantly sexual mode of transmission: Secondary | ICD-10-CM | POA: Diagnosis not present

## 2021-01-07 MED ORDER — BUSPIRONE HCL 10 MG PO TABS: 10.0000 mg | ORAL_TABLET | Freq: Three times a day (TID) | ORAL | 1 refills | Status: DC

## 2021-01-07 MED ORDER — PAROXETINE HCL 10 MG PO TABS: 10.0000 mg | ORAL_TABLET | Freq: Every day | ORAL | 1 refills | Status: DC

## 2021-01-07 NOTE — Progress Notes (Addendum)
Established Patient Office Visit  Subjective:  Patient ID: Rachel Bennett, female    DOB: 1980-07-13  Age: 40 y.o. MRN: [id number]  CC:  Chief Complaint  Patient presents with   Follow-up    Medication Change    HPI Rachel Bennett presents for follow up on mood management. Patient also has c/o left knee pain.  [place].  Initially had mild swelling which has resolved.  Tried to use knee brace which did not help. Reports soreness is improving. Taking Tylenol as needed for pain relief.  Mood: Patient is seeing [facility] counselor and reports he recommended to discuss with PCP starting clonopin to help with anxiety. Patient reports is going through major life changes. States her husband has been working out of the country in [place] and had an affair so are going through a separation which has led to increased responsibilities. States he will be in town in a few weeks which has increased her anxiety.. Inquiring to change mirtazapine to help improve anxiety. Patient was started on mirtazapine for mood and also to help with improving her weight. Endorses passive SI w/o active plan. Is also requesting repeating STD screening.     Past Medical History:  Diagnosis Date   Abnormal EKG 05/20/2020   Anemia    Anxiety    Atypical nevi 05/19/2017   Chronic bilateral low back pain 03/05/2017   Chronic pain    back, hips and pelvis   Chronic radicular lumbar pain 05/19/2017   Cigarette smoker 05/20/2020   Depression    Dysmenorrhea    Dysphagia, idiopathic 03/05/2017   Elevated lipids    Encounter for insertion of mirena IUD 05/19/2017   Endometriosis    Healthcare maintenance 05/19/2017   High serum low density lipoprotein (LDL) cholesterol 05/19/2017   Iron deficiency anemia 05/18/2017   Mixed dyslipidemia 05/20/2020   Opiate withdrawal ([place]) 03/04/2017   Ovarian cyst    Palpitations 07/01/2020   Pre-diabetes     Prediabetes    Preoperative cardiovascular examination 05/20/2020   S/P laparoscopic hysterectomy 05/28/2020   Screening examination for STD (sexually transmitted disease) 05/19/2017   Tobacco use 05/19/2017   Vitamin D deficiency 05/19/2017    Past Surgical History:  Procedure Laterality Date   CYSTOSCOPY N/A 05/28/2020   Procedure: CYSTOSCOPY;  Surgeon: Salvadore Dom, MD;  Location: [place];  Service: Gynecology;  Laterality: N/A;   LEEP     LUMBAR DISC SURGERY     NASAL SEPTUM SURGERY     TOTAL LAPAROSCOPIC HYSTERECTOMY WITH SALPINGECTOMY N/A 05/28/2020   Procedure: TOTAL LAPAROSCOPIC HYSTERECTOMY WITH SALPINGECTOMY;  Surgeon: Salvadore Dom, MD;  Location: [facility];  Service: Gynecology;  Laterality: N/A;    Family History  Problem Relation Age of Onset   Arthritis Mother    Anxiety disorder Mother    Depression Mother    Diabetes Mother    Heart disease Mother    Hyperlipidemia Mother    Hypertension Mother    Thyroid disease Mother    Birth defects Sister    Migraines Sister    Healthy Brother    Healthy Daughter    Healthy Sister    Drug abuse Brother    Healthy Brother    Healthy Daughter     Social History   Socioeconomic History   Marital status: Married    Spouse name: Not on file  Number of children: Not on file   Years of education: Not on file   Highest education level: Not on file  Occupational History   Not on file  Tobacco Use   Smoking status: Every Day    Packs/day: 1.50    Years: 23.00    Pack years: 34.50    Types: Cigarettes    Last attempt to quit: 1998    Years since quitting: 24.5   Smokeless tobacco: Never  Vaping Use   Vaping Use: Never used  Substance and Sexual Activity   Alcohol use: No   Drug use: No   Sexual activity: Yes    Birth control/protection: None  Other Topics Concern   Not on file  Social History Narrative   Not on file   Social Determinants of Health    Financial Resource Strain: Not on file  Food Insecurity: Not on file  Transportation Needs: Not on file  Physical Activity: Not on file  Stress: Not on file  Social Connections: Not on file  Intimate Partner Violence: Not on file    Outpatient Medications Prior to Visit  Medication Sig Dispense Refill   acetaminophen (TYLENOL) 500 MG tablet Take 500 mg by mouth every 6 (six) hours as needed for mild pain or headache.     busPIRone (BUSPAR) 10 MG tablet Take 1 tablet (10 mg total) by mouth 3 (three) times daily. 90 tablet 1   ivabradine (CORLANOR) 5 MG TABS tablet Take 1 tablet (5 mg total) by mouth 2 (two) times daily with a meal. 60 tablet 3   mirtazapine (REMERON) 15 MG tablet Take 1 tablet (15 mg total) by mouth at bedtime. 90 tablet 0   metoprolol succinate (TOPROL XL) 50 MG 24 hr tablet Take 1 tablet (50 mg total) by mouth daily. 90 tablet 1   No facility-administered medications prior to visit.    Allergies  Allergen Reactions   Sulfa Antibiotics Itching    ROS Review of Systems Review of Systems:  A fourteen system review of systems was performed and found to be positive as per HPI.   Objective:    Physical Exam General:  Well Developed, well nourished, appropriate for stated age.  Neuro:  Alert and oriented,  extra-ocular muscles intact  HEENT:  Normocephalic, atraumatic, neck supple Skin:  no gross rash, warm, pink. Cardiac:  RRR Respiratory:  ECTA B/L and A/P, Not using accessory muscles, speaking in full sentences- unlabored. MSK: Good ROM of LE, normal gait, no joint line tenderness, no bony abnormality Vascular:  Ext warm, no cyanosis apprec.; cap RF less 2 sec. Psych:  judgement and insight good, anxious mood. Full Affect.  BP 115/79   Pulse 91   Temp 98.2 F (36.8 C)   Ht 5' 5.5" (1.664 m)   Wt 104 lb 0.8 oz (47.2 kg)   LMP 05/06/2020 Comment: spotting   SpO2 99%   BMI 17.05 kg/m  Wt Readings from Last 3 Encounters:  01/07/21 104 lb 0.8 oz  (47.2 kg)  12/10/20 102 lb 12.8 oz (46.6 kg)  11/13/20 110 lb (49.9 kg)     Health Maintenance Due  Topic Date Due   COVID-19 Vaccine (1) Never done   Pneumococcal Vaccine 91-10 Years old (1 - PCV) Never done    There are no preventive care reminders to display for this patient.  Lab Results  Component Value Date   TSH 3.620 07/24/2020   Lab Results  Component Value Date   WBC 8.9 07/24/2020  HGB 14.4 07/24/2020   HCT 46.0 07/24/2020   MCV 80 07/24/2020   PLT 410 07/24/2020   Lab Results  Component Value Date   NA 139 07/24/2020   K 4.9 07/24/2020   CO2 23 07/24/2020   GLUCOSE 155 (H) 07/24/2020   BUN 29 (H) 07/24/2020   CREATININE 0.84 07/24/2020   BILITOT 0.5 07/24/2020   ALKPHOS 90 07/24/2020   AST 20 07/24/2020   ALT 11 07/24/2020   PROT 8.1 07/24/2020   ALBUMIN 4.9 (H) 07/24/2020   CALCIUM 10.1 07/24/2020   Lab Results  Component Value Date   CHOL 226 (H) 04/08/2020   Lab Results  Component Value Date   HDL 36 (L) 04/08/2020   Lab Results  Component Value Date   LDLCALC 174 (H) 04/08/2020   Lab Results  Component Value Date   TRIG 91 04/08/2020   Lab Results  Component Value Date   CHOLHDL 6.3 (H) 04/08/2020   Lab Results  Component Value Date   HGBA1C 5.8 (H) 04/08/2020      Assessment & Plan:   Problem List Items Addressed This Visit   None Visit Diagnoses     Anxiety and depression    -  Primary   Relevant Medications   PARoxetine (PAXIL) 10 MG tablet   Screening for STD (sexually transmitted disease)       Relevant Orders   GC/Chlamydia Probe Amp   HIV antibody (with reflex)   RPR   Hepatitis C Antibody   Stress at home       Acute pain of left knee           Anxiety and depression: -PHQ-9 score of 19, GAD-7 score of 20, uncontrolled. Patient able to contract safety. -Discussed with patient treatment adjustments and thoroughly discussed risks vs benefits of benzodiazepine and do not recommend due to history of opioid  abuse. Patient verbalized understanding and is agreeable. Will discontinue/taper mirtazapine and start paroxetine 10 mg. Discussed potential side effects and advised to let me know if unable to tolerate or dose ineffective. Paxil will also provide benefits for weight.  -Discussed restarting hydroxyzine as needed for severe anxiety to help with upcoming return of her spouse. -Continue BH therapy sessions. Will request records. -Discussed to consider referral to psychiatry if symptoms fail to improve or worsen despite treatment adjustments. -Follow up in 8 weeks to reassess medication therapy.  Acute pain of left knee: -Improving. -Recommend to continue with conservative therapy. If symptoms fail to improve or worsen recommend further evaluation with imaging studies.   Meds ordered this encounter  Medications   PARoxetine (PAXIL) 10 MG tablet    Sig: Take 1 tablet (10 mg total) by mouth daily.    Dispense:  30 tablet    Refill:  1    Order Specific Question:   Supervising Provider    Answer:   Beatrice Lecher D [[id number]]    Follow-up: Return in about 8 weeks (around 03/04/2021) for Mood- changed med.   Note:  This note was prepared with assistance of Dragon voice recognition software. Occasional wrong-word or sound-a-like substitutions may have occurred due to the inherent limitations of voice recognition software.  Lorrene Reid, PA-C

## 2021-01-07 NOTE — Patient Instructions (Signed)
Managing Stress, Adult Feeling a certain amount of stress is normal. Stress helps our body and mind get ready to deal with the demands of life. Stress hormones can motivate you to do well at work and meet your responsibilities. However severe or long-lasting (chronic) stress can affect your mental and physical health. Chronic stress puts you at higher risk for anxiety, depression, and other health problems like digestiveproblems, muscle aches, heart disease, high blood pressure, and stroke. What are the causes? Common causes of stress include: Demands from work, such as deadlines, feeling overworked, or having long hours. Pressures at home, such as money issues, disagreements with a spouse, or parenting issues. Pressures from major life changes, such as divorce, moving, loss of a loved one, or chronic illness. You may be at higher risk for stress-related problems if you do not get enough sleep, are in poor health, do not have emotional support, or have a mentalhealth disorder like anxiety or depression. How to recognize stress Stress can make you: Have trouble sleeping. Feel sad, anxious, irritable, or overwhelmed. Lose your appetite. Overeat or want to eat unhealthy foods. Want to use drugs or alcohol. Stress can also cause physical symptoms, such as: Sore, tense muscles, especially in the shoulders and neck. Headaches. Trouble breathing. A faster heart rate. Stomach pain, nausea, or vomiting. Diarrhea or constipation. Trouble concentrating. Follow these instructions at home: Lifestyle Identify the source of your stress and your reaction to it. See a therapist who can help you change your reactions. When there are stressful events: Talk about it with family, friends, or co-workers. Try to think realistically about stressful events and not ignore them or overreact. Try to find the positives in a stressful situation and not focus on the negatives. Cut back on responsibilities at work and  home, if possible. Ask for help from friends or family members if you need it. Find ways to cope with stress, such as: Meditation. Deep breathing. Yoga or tai chi. Progressive muscle relaxation. Doing art, playing music, or reading. Making time for fun activities. Spending time with family and friends. Get support from family, friends, or spiritual resources. Eating and drinking Eat a healthy diet. This includes: Eating foods that are high in fiber, such as beans, whole grains, and fresh fruits and vegetables. Limiting foods that are high in fat and processed sugars, such as fried and sweet foods. Do not skip meals or overeat. Drink enough fluid to keep your urine pale yellow. Alcohol use Do not drink alcohol if: Your health care provider tells you not to drink. You are pregnant, may be pregnant, or are planning to become pregnant. Drinking alcohol is a way some people try to ease their stress. This can be dangerous, so if you drink alcohol: Limit how much you use to: 0-1 drink a day for women. 0-2 drinks a day for men. Be aware of how much alcohol is in your drink. In the [place] one drink equals one 12 oz bottle of beer (355 mL), one 5 oz glass of wine (148 mL), or one 1 oz glass of hard liquor (44 mL). Activity  Include 30 minutes of exercise in your daily schedule. Exercise is a good stress reducer. Include time in your day for an activity that you find relaxing. Try taking a walk, going on a bike ride, reading a book, or listening to music. Schedule your time in a way that lowers stress, and keep a consistent schedule. Prioritize what is most important to get done.  General  instructions Get enough sleep. Try to go to sleep and get up at about the same time every day. Take over-the-counter and prescription medicines only as told by your health care provider. Do not use any products that contain nicotine or tobacco, such as cigarettes, e-cigarettes, and chewing tobacco. If you  need help quitting, ask your health care provider. Do not use drugs or smoke to cope with stress. Keep all follow-up visits as told by your health care provider. This is important. Where to find support Talk with your health care provider about stress management or finding a support group. Find a therapist to work with you on your stress management techniques. Contact a health care provider if: Your stress symptoms get worse. You are unable to manage your stress at home. You are struggling to stop using drugs or alcohol. Get help right away if: You may be a danger to yourself or others. You have any thoughts of death or suicide. If you ever feel like you may hurt yourself or others, or have thoughts about taking your own life, get help right away. You can go to your nearest emergency department or call: Your local emergency services (911 in the [place] A suicide crisis helpline, such as the [place] at 343-055-5805. This is open 24 hours a day. Summary Feeling a certain amount of stress is normal, but severe or long-lasting (chronic) stress can affect your mental and physical health. Chronic stress can put you at higher risk for anxiety, depression, and other health problems like digestive problems, muscle aches, heart disease, high blood pressure, and stroke. You may be at higher risk for stress-related problems if you do not get enough sleep, are in poor health, lack emotional support, or have a mental health disorder like anxiety or depression. Identify the source of your stress and your reaction to it. Try talking about stressful events with family, friends, or co-workers, finding a coping method, or getting support from spiritual resources. If you need more help, talk with your health care provider about finding a support group or a mental health therapist. This information is not intended to replace advice given to you by your health care provider. Make sure  you discuss any questions you have with your healthcare provider. Document Revised: 01/04/2019 Document Reviewed: 01/04/2019 Elsevier Patient Education  [place].

## 2021-01-08 ENCOUNTER — Telehealth: Payer: Self-pay

## 2021-01-08 ENCOUNTER — Telehealth: Payer: Self-pay | Admitting: Pharmacist

## 2021-01-08 LAB — HEPATITIS C ANTIBODY: Hep C Virus Ab: <0.1 s/co ratio (ref 0.0–0.9)

## 2021-01-08 LAB — RPR: RPR Ser Ql: NONREACTIVE

## 2021-01-08 LAB — HIV ANTIBODY (ROUTINE TESTING W REFLEX): HIV Screen 4th Generation wRfx: NONREACTIVE

## 2021-01-08 NOTE — Telephone Encounter (Signed)
Marena Chancy Key: [id number] - PA Case ID: [id number] Need help? Call us at [phone] Outcome Approvedtoday Request Reference Number: [id number]. CORLANOR TAB 5MG  is approved through 07/11/2021. Your patient may now fill this prescription and it will be covered.

## 2021-01-08 NOTE — Telephone Encounter (Signed)
PA for CORLANOR TAB 5MG  is approved through 07/11/2021.  Key: [id number]

## 2021-01-09 LAB — GC/CHLAMYDIA PROBE AMP
Chlamydia trachomatis, NAA: NEGATIVE
Neisseria Gonorrhoeae by PCR: NEGATIVE

## 2021-02-14 ENCOUNTER — Encounter: Payer: Self-pay | Admitting: Physician Assistant

## 2021-02-14 DIAGNOSIS — F32A Depression, unspecified: Secondary | ICD-10-CM

## 2021-02-14 DIAGNOSIS — F1910 Other psychoactive substance abuse, uncomplicated: Secondary | ICD-10-CM

## 2021-02-14 DIAGNOSIS — F439 Reaction to severe stress, unspecified: Secondary | ICD-10-CM

## 2021-02-14 DIAGNOSIS — F419 Anxiety disorder, unspecified: Secondary | ICD-10-CM

## 2021-03-04 ENCOUNTER — Other Ambulatory Visit: Payer: Self-pay

## 2021-03-04 ENCOUNTER — Encounter: Payer: Self-pay | Admitting: Physician Assistant

## 2021-03-04 ENCOUNTER — Ambulatory Visit (INDEPENDENT_AMBULATORY_CARE_PROVIDER_SITE_OTHER): Payer: 59 | Admitting: Physician Assistant

## 2021-03-04 VITALS — BP 123/88 | HR 74 | Temp 98.9°F | Ht 65.5 in | Wt 100.2 lb

## 2021-03-04 DIAGNOSIS — F439 Reaction to severe stress, unspecified: Secondary | ICD-10-CM

## 2021-03-04 DIAGNOSIS — R21 Rash and other nonspecific skin eruption: Secondary | ICD-10-CM | POA: Diagnosis not present

## 2021-03-04 DIAGNOSIS — F411 Generalized anxiety disorder: Secondary | ICD-10-CM | POA: Diagnosis not present

## 2021-03-04 DIAGNOSIS — F322 Major depressive disorder, single episode, severe without psychotic features: Secondary | ICD-10-CM

## 2021-03-04 DIAGNOSIS — Z716 Tobacco abuse counseling: Secondary | ICD-10-CM

## 2021-03-04 MED ORDER — PREDNISONE 20 MG PO TABS
20.0000 mg | ORAL_TABLET | Freq: Every day | ORAL | 0 refills | Status: DC
Start: 2021-03-04 — End: 2021-05-13

## 2021-03-04 NOTE — Patient Instructions (Addendum)
Psychiatry - [facility] at [place],  [place]  [id number] Main Line: [phone] Hours: 7am - 5:30pm (M-Th) and 8am - 1pm (F)  Managing Anxiety, Adult After being diagnosed with an anxiety disorder, you may be relieved to know why you have felt or behaved a certain way. You may also feel overwhelmed about the treatment ahead and what it will mean for your life. With care and support, you can manage this condition and recover from it. How to manage lifestyle changes Managing stress and anxiety Stress is your body's reaction to life changes and events, both good and bad. Most stress will last just a few hours, but stress can be ongoing and can lead to more than just stress. Although stress can play a major role in anxiety, it is not the same as anxiety. Stress is usually caused by something external, such as a deadline, test, or competition. Stress normally passes after the triggering event has ended.  Anxiety is caused by something internal, such as imagining a terrible outcome or worrying that something will go wrong that will devastate you. Anxiety often does not go away even after the triggering event is over, and it can become long-term (chronic) worry. It is important to understand the differences between stress and anxiety and to manage your stress effectively so that it does not lead to an anxious response. Talk with your health care provider or a counselor to learn more about reducing anxiety and stress. He or she may suggest tension reduction techniques, such as: Music therapy. This can include creating or listening to music that you enjoy and that inspires you. Mindfulness-based meditation. This involves being aware of your normal breaths while not trying to control your breathing. It can be done while sitting or walking. Centering prayer. This involves focusing on a word, phrase, or sacred image that means something to you and  brings you peace. Deep breathing. To do this, expand your stomach and inhale slowly through your nose. Hold your breath for 3-5 seconds. Then exhale slowly, letting your stomach muscles relax. Self-talk. This involves identifying thought patterns that lead to anxiety reactions and changing those patterns. Muscle relaxation. This involves tensing muscles and then relaxing them. Choose a tension reduction technique that suits your lifestyle and personality. These techniques take time and practice. Set aside 5-15 minutes a day to do them. Therapists can offer counseling and training in these techniques. The training to help with anxiety may be covered by some insurance plans. Other things you can do to manage stress and anxiety include: Keeping a stress/anxiety diary. This can help you learn what triggers your reaction and then learn ways to manage your response. Thinking about how you react to certain situations. You may not be able to control everything, but you can control your response. Making time for activities that help you relax and not feeling guilty about spending your time in this way. Visual imagery and yoga can help you stay calm and relax.  Medicines Medicines can help ease symptoms. Medicines for anxiety include: Anti-anxiety drugs. Antidepressants. Medicines are often used as a primary treatment for anxiety disorder. Medicines will be prescribed by a health care provider. When used together, medicines, psychotherapy, and tension reduction techniques may be the most effective treatment. Relationships Relationships can play a big part in helping you recover. Try to spend more time connecting with trusted friends and family members. Consider going to couples counseling, taking family education classes,  or going to family therapy. Therapy can help you and others better understand your condition. How to recognize changes in your anxiety Everyone responds differently to treatment for  anxiety. Recovery from anxiety happens when symptoms decrease and stop interfering with your daily activities at home or work. This may mean that you will start to: Have better concentration and focus. Worry will interfere less in your daily thinking. Sleep better. Be less irritable. Have more energy. Have improved memory. It is important to recognize when your condition is getting worse. Contact your health care provider if your symptoms interfere with home or work and you feel like your condition is not improving. Follow these instructions at home: Activity Exercise. Most adults should do the following: Exercise for at least 150 minutes each week. The exercise should increase your heart rate and make you sweat (moderate-intensity exercise). Strengthening exercises at least twice a week. Get the right amount and quality of sleep. Most adults need 7-9 hours of sleep each night. Lifestyle  Eat a healthy diet that includes plenty of vegetables, fruits, whole grains, low-fat dairy products, and lean protein. Do not eat a lot of foods that are high in solid fats, added sugars, or salt. Make choices that simplify your life. Do not use any products that contain nicotine or tobacco, such as cigarettes, e-cigarettes, and chewing tobacco. If you need help quitting, ask your health care provider. Avoid caffeine, alcohol, and certain over-the-counter cold medicines. These may make you feel worse. Ask your pharmacist which medicines to avoid. General instructions Take over-the-counter and prescription medicines only as told by your health care provider. Keep all follow-up visits as told by your health care provider. This is important. Where to find support You can get help and support from these sources: Self-help groups. Online and [employer]. A trusted spiritual leader. Couples counseling. Family education classes. Family therapy. Where to find more information You may find that  joining a support group helps you deal with your anxiety. The following sources can help you locate counselors or support groups near you: [place]: www.mentalhealthamerica.net Anxiety and Depression Association of [place] (ADAA): https://www.clark.net/ National Alliance on Mental Illness (NAMI): www.nami.org Contact a health care provider if you: Have a hard time staying focused or finishing daily tasks. Spend many hours a day feeling worried about everyday life. Become exhausted by worry. Start to have headaches, feel tense, or have nausea. Urinate more than normal. Have diarrhea. Get help right away if you have: A racing heart and shortness of breath. Thoughts of hurting yourself or others. If you ever feel like you may hurt yourself or others, or have thoughts about taking your own life, get help right away. You can go to your nearest emergency department or call: Your local emergency services (911 in the [place] A suicide crisis helpline, such as the [facility] at 561-336-5693. This is open 24 hours a day. Summary Taking steps to learn and use tension reduction techniques can help calm you and help prevent triggering an anxiety reaction. When used together, medicines, psychotherapy, and tension reduction techniques may be the most effective treatment. Family, friends, and partners can play a big part in helping you recover from an anxiety disorder. This information is not intended to replace advice given to you by your health care provider. Make sure you discuss any questions you have with your health care provider. Document Revised: 11/08/2018 Document Reviewed: 11/08/2018 Elsevier Patient Education  [place].
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
## 2021-03-25 ENCOUNTER — Ambulatory Visit: Payer: 59 | Admitting: Cardiology

## 2021-03-27 ENCOUNTER — Ambulatory Visit: Payer: 59 | Admitting: Cardiology

## 2021-04-19 ENCOUNTER — Other Ambulatory Visit: Payer: Self-pay | Admitting: Cardiology

## 2021-05-13 ENCOUNTER — Ambulatory Visit (INDEPENDENT_AMBULATORY_CARE_PROVIDER_SITE_OTHER): Payer: 59

## 2021-05-13 ENCOUNTER — Ambulatory Visit
Admission: EM | Admit: 2021-05-13 | Discharge: 2021-05-13 | Disposition: A | Discharge status: Home / Self Care | Payer: 59 | Attending: Physician Assistant | Admitting: Physician Assistant

## 2021-05-13 ENCOUNTER — Other Ambulatory Visit: Payer: Self-pay

## 2021-05-13 DIAGNOSIS — M542 Cervicalgia: Secondary | ICD-10-CM

## 2021-05-13 DIAGNOSIS — M25511 Pain in right shoulder: Secondary | ICD-10-CM

## 2021-05-13 NOTE — ED Provider Notes (Signed)
[facility]    CSN: [id number] Arrival date & time: 05/13/21  1750      History   Chief Complaint Chief Complaint  Patient presents with   Shoulder Injury    HPI Rachel Bennett is a 40 y.o. female.   Patient here today for evaluation of right shoulder and right-sided neck pain that started about 4 days ago.  She reports that she woke in her bed and pain but is not sure if she fell the night before or what occurred.  She states with the amount of pain she feels as if she might of fallen.  She has tried Tylenol with mild relief of symptoms.  She denies any numbness or tingling.  The history is provided by the patient.  Shoulder Injury Pertinent negatives include no abdominal pain.   Past Medical History:  Diagnosis Date   Abnormal EKG 05/20/2020   Anemia    Anxiety    Atypical nevi 05/19/2017   Chronic bilateral low back pain 03/05/2017   Chronic pain    back, hips and pelvis   Chronic radicular lumbar pain 05/19/2017   Cigarette smoker 05/20/2020   Depression    Dysmenorrhea    Dysphagia, idiopathic 03/05/2017   Elevated lipids    Encounter for insertion of mirena IUD 05/19/2017   Endometriosis    Healthcare maintenance 05/19/2017   High serum low density lipoprotein (LDL) cholesterol 05/19/2017   Iron deficiency anemia 05/18/2017   Mixed dyslipidemia 05/20/2020   NSVT (nonsustained ventricular tachycardia) ([place]) 12/10/2020   Opiate withdrawal ([place]) 03/04/2017   Ovarian cyst    Palpitations 07/01/2020   Pre-diabetes    Prediabetes    Preoperative cardiovascular examination 05/20/2020   S/P laparoscopic hysterectomy 05/28/2020   Screening examination for STD (sexually transmitted disease) 05/19/2017   Tobacco use 05/19/2017   Vitamin D deficiency 05/19/2017    Patient Active Problem List   Diagnosis Date Noted   NSVT (nonsustained ventricular tachycardia) 12/10/2020   Palpitations 07/01/2020   Pre-diabetes    S/P laparoscopic hysterectomy  05/28/2020   Preoperative cardiovascular examination 05/20/2020   Mixed dyslipidemia 05/20/2020   Cigarette smoker 05/20/2020   Abnormal EKG 05/20/2020   Ovarian cyst    Endometriosis    Dysmenorrhea    Depression    Chronic pain    Anxiety    Anemia    Prediabetes    Elevated lipids    Chronic radicular lumbar pain 05/19/2017   Encounter for insertion of mirena IUD 05/19/2017   Atypical nevi 05/19/2017   Screening examination for STD (sexually transmitted disease) 05/19/2017   Tobacco use 05/19/2017   High serum low density lipoprotein (LDL) cholesterol 05/19/2017   Healthcare maintenance 05/19/2017   Vitamin D deficiency 05/19/2017   Iron deficiency anemia 05/18/2017   Chronic bilateral low back pain 03/05/2017   Dysphagia, idiopathic 03/05/2017   Opiate withdrawal ([place]) 03/04/2017    Past Surgical History:  Procedure Laterality Date   CYSTOSCOPY N/A 05/28/2020   Procedure: CYSTOSCOPY;  Surgeon: Salvadore Dom, MD;  Location: [facility];  Service: Gynecology;  Laterality: N/A;   LEEP     LUMBAR DISC SURGERY     NASAL SEPTUM SURGERY     TOTAL LAPAROSCOPIC HYSTERECTOMY WITH SALPINGECTOMY N/A 05/28/2020   Procedure: TOTAL LAPAROSCOPIC HYSTERECTOMY WITH SALPINGECTOMY;  Surgeon: Salvadore Dom, MD;  Location: [facility];  Service: Gynecology;  Laterality: N/A;    OB History     Gravida  2  Para  2   Term  2   Preterm      AB      Living  2      SAB      IAB      Ectopic      Multiple      Live Births  2            Home Medications    Prior to Admission medications   Medication Sig Start Date End Date Taking? Authorizing Provider  acetaminophen (TYLENOL) 500 MG tablet Take 500 mg by mouth every 6 (six) hours as needed for mild pain or headache.    [provider]  busPIRone (BUSPAR) 10 MG tablet Take 1 tablet (10 mg total) by mouth 3 (three) times daily. 01/07/21   Abonza, Herb Grays, PA-C   ivabradine (CORLANOR) 5 MG TABS tablet TAKE 1 TABLET BY MOUTH TWICE A DAY WITH A MEAL 04/21/21   Revankar, Reita Cliche, MD  PARoxetine (PAXIL) 10 MG tablet Take 1 tablet (10 mg total) by mouth daily. 01/07/21   Lorrene Reid, PA-C    Family History Family History  Problem Relation Age of Onset   Arthritis Mother    Anxiety disorder Mother    Depression Mother    Diabetes Mother    Heart disease Mother    Hyperlipidemia Mother    Hypertension Mother    Thyroid disease Mother    Birth defects Sister    Migraines Sister    Healthy Brother    Healthy Daughter    Healthy Sister    Drug abuse Brother    Healthy Brother    Healthy Daughter     Social History Social History   Tobacco Use   Smoking status: Every Day    Packs/day: 1.50    Years: 23.00    Pack years: 34.50    Types: Cigarettes    Last attempt to quit: 1998    Years since quitting: 24.9   Smokeless tobacco: Never  Vaping Use   Vaping Use: Never used  Substance Use Topics   Alcohol use: No   Drug use: No     Allergies   Sulfa antibiotics and Ibuprofen   Review of Systems Review of Systems  Constitutional:  Negative for chills and fever.  Eyes:  Negative for discharge and redness.  Gastrointestinal:  Negative for abdominal pain, nausea and vomiting.  Genitourinary:  Positive for vaginal bleeding and vaginal discharge.  Musculoskeletal:  Positive for arthralgias, myalgias and neck pain.    Physical Exam Triage Vital Signs ED Triage Vitals  Enc Vitals Group     BP 05/13/21 1830 135/86     Pulse Rate 05/13/21 1830 96     Resp 05/13/21 1830 18     Temp 05/13/21 1830 98.6 F (37 C)     Temp Source 05/13/21 1830 Oral     SpO2 05/13/21 1830 97 %     Weight --      Height --      Head Circumference --      Peak Flow --      Pain Score 05/13/21 1843 7     Pain Loc --      Pain Edu? --      Excl. in [place]? --    No data found.  Updated Vital Signs BP 135/86 (BP Location: Left Arm)   Pulse 96    Temp 98.6 F (37 C) (Oral)   Resp 18   LMP  05/06/2020 Comment: spotting   SpO2 97%      Physical Exam Vitals and nursing note reviewed.  Constitutional:      General: She is not in acute distress.    Appearance: Normal appearance. She is not ill-appearing.  HENT:     Head: Normocephalic and atraumatic.  Eyes:     Conjunctiva/sclera: Conjunctivae normal.  Cardiovascular:     Rate and Rhythm: Normal rate.  Pulmonary:     Effort: Pulmonary effort is normal.  Musculoskeletal:     Comments: Decreased ROM of right shoulder in all directions due to pain, Mild TTP diffusely to right posterior shoulder, right neck/ trapezius  Neurological:     Mental Status: She is alert.  Psychiatric:        Mood and Affect: Mood normal.        Behavior: Behavior normal.        Thought Content: Thought content normal.     UC Treatments / Results  Labs (all labs ordered are listed, but only abnormal results are displayed) Labs Reviewed - No data to display  EKG   Radiology DG Cervical Spine Complete  Result Date: 05/13/2021 CLINICAL DATA:  Cervical neck pain since Saturday. EXAM: CERVICAL SPINE - COMPLETE 4+ VIEW COMPARISON:  None. FINDINGS: Cervical spine alignment is maintained. Vertebral body heights and intervertebral disc spaces are preserved. The dens is intact. Posterior elements appear well-aligned. No significant bony neural foraminal narrowing. There is no evidence of fracture. No prevertebral soft tissue edema. IMPRESSION: Negative radiographs of the cervical spine. Electronically Signed   By: Keith Rake M.D.   On: 05/13/2021 19:29   DG Shoulder Right  Result Date: 05/13/2021 CLINICAL DATA:  Right shoulder pain EXAM: RIGHT SHOULDER - 2+ VIEW COMPARISON:  None. FINDINGS: There is no evidence of fracture or dislocation. There is no evidence of arthropathy or other focal bone abnormality. Soft tissues are unremarkable. IMPRESSION: Negative. Electronically Signed   By: Donavan Foil M.D.   On: 05/13/2021 19:28    Procedures Procedures (including critical care time)  Medications Ordered in UC Medications - No data to display  Initial Impression / Assessment and Plan / UC Course  I have reviewed the triage vital signs and the nursing notes.  Pertinent labs & imaging results that were available during my care of the patient were reviewed by me and considered in my medical decision making (see chart for details).  Xray without fracture. Recommended symptomatic treatment and follow up if needed. Muscle relaxer deferred at this time due to prior issues with addiction.    Final Clinical Impressions(s) / UC Diagnoses   Final diagnoses:  Acute pain of right shoulder  Neck pain     Discharge Instructions      Xrays without fracture. Continue symptomatic treatment. Follow up with any further concerns. Recommend ED with any further episodes that she cannot remember.     ED Prescriptions   None    PDMP not reviewed this encounter.   Francene Finders, PA-C 05/13/21 [phone]

## 2021-05-13 NOTE — Discharge Instructions (Signed)
Xrays without fracture. Continue symptomatic treatment. Follow up with any further concerns. Recommend ED with any further episodes that she cannot remember.

## 2021-05-13 NOTE — ED Triage Notes (Signed)
Pt c/o rt shoulder, neck, and back pain since Saturday. States unsure if she passed out and fell but woke up in her bed hurting.

## 2021-06-06 ENCOUNTER — Encounter: Payer: Self-pay | Admitting: Emergency Medicine

## 2021-06-06 DIAGNOSIS — R45851 Suicidal ideations: Secondary | ICD-10-CM | POA: Insufficient documentation

## 2021-06-06 DIAGNOSIS — Y908 Blood alcohol level of 240 mg/100 ml or more: Secondary | ICD-10-CM | POA: Diagnosis not present

## 2021-06-06 DIAGNOSIS — F1090 Alcohol use, unspecified, uncomplicated: Secondary | ICD-10-CM | POA: Diagnosis not present

## 2021-06-06 DIAGNOSIS — F32A Depression, unspecified: Secondary | ICD-10-CM | POA: Diagnosis not present

## 2021-06-06 DIAGNOSIS — Z046 Encounter for general psychiatric examination, requested by authority: Secondary | ICD-10-CM | POA: Diagnosis present

## 2021-06-06 DIAGNOSIS — Z5321 Procedure and treatment not carried out due to patient leaving prior to being seen by health care provider: Secondary | ICD-10-CM | POA: Diagnosis not present

## 2021-06-06 LAB — CBC
HCT: 48 % — ABNORMAL HIGH (ref 36.0–46.0)
Hemoglobin: 16.2 g/dL — ABNORMAL HIGH (ref 12.0–15.0)
MCH: 32.9 pg (ref 26.0–34.0)
MCHC: 33.8 g/dL (ref 30.0–36.0)
MCV: 97.6 fL (ref 80.0–100.0)
Platelets: 294 10*3/uL (ref 150–400)
RBC: 4.92 MIL/uL (ref 3.87–5.11)
RDW: 16.6 % — ABNORMAL HIGH (ref 11.5–15.5)
WBC: 5.1 10*3/uL (ref 4.0–10.5)
nRBC: 0 % (ref 0.0–0.2)

## 2021-06-06 LAB — COMPREHENSIVE METABOLIC PANEL
ALT: 23 U/L (ref 0–44)
AST: 42 U/L — ABNORMAL HIGH (ref 15–41)
Albumin: 5 g/dL (ref 3.5–5.0)
Alkaline Phosphatase: 85 U/L (ref 38–126)
Anion gap: 17 — ABNORMAL HIGH (ref 5–15)
BUN: 8 mg/dL (ref 6–20)
CO2: 22 mmol/L (ref 22–32)
Calcium: 8.7 mg/dL — ABNORMAL LOW (ref 8.9–10.3)
Chloride: 101 mmol/L (ref 98–111)
Creatinine, Ser: 0.3 mg/dL — ABNORMAL LOW (ref 0.44–1.00)
GFR, Estimated: >60 mL/min (ref >60)
Glucose, Bld: 82 mg/dL (ref 70–99)
Potassium: 3.6 mmol/L (ref 3.5–5.1)
Sodium: 140 mmol/L (ref 135–145)
Total Bilirubin: 1.1 mg/dL (ref 0.3–1.2)
Total Protein: 9.1 g/dL — ABNORMAL HIGH (ref 6.5–8.1)

## 2021-06-06 LAB — ACETAMINOPHEN LEVEL: Acetaminophen (Tylenol), Serum: <10 ug/mL — ABNORMAL LOW (ref 10–30)

## 2021-06-06 LAB — SALICYLATE LEVEL: Salicylate Lvl: <7 mg/dL — ABNORMAL LOW (ref 7.0–30.0)

## 2021-06-06 LAB — ETHANOL: Alcohol, Ethyl (B): 339 mg/dL (ref <10)

## 2021-06-06 NOTE — ED Notes (Addendum)
Pt has two daughters she wants listed with contact information:  Mel Almond [phone]) - [phone] Ava (13y) - [phone]  Brother, Corene Cornea remains primary contact

## 2021-06-06 NOTE — ED Notes (Signed)
Pt with Black shoes, white socks, purple tank, green shirt, purple hoodie, underwear and grey sweatpants, all placed in belongings bag

## 2021-06-06 NOTE — ED Triage Notes (Signed)
Pt arrives with sister and family - voluntarily for SI. Per sister, the pt has been binge drinking as a way of coping after her husband left her during summer. Had a referral for psychiatrist a few mo's ago, and stopped going for personal reasons. On and off compliance with meds, hx of depression. Pt has been binging on several White Claws today, feels like "she'd bne better of dead". No HI, AH/VH, but does currently have SI. Denies any drug use, just ETOH

## 2021-06-07 ENCOUNTER — Emergency Department
Admission: EM | Admit: 2021-06-07 | Discharge: 2021-06-07 | Disposition: A | Discharge status: Home / Self Care | Payer: 59 | Attending: Emergency Medicine | Admitting: Emergency Medicine

## 2021-06-07 DIAGNOSIS — F10930 Alcohol use, unspecified with withdrawal, uncomplicated: Secondary | ICD-10-CM | POA: Insufficient documentation

## 2021-06-07 MED ORDER — CHLORDIAZEPOXIDE HCL 25 MG PO CAPS: 50.0000 mg | ORAL_CAPSULE | Freq: Once | ORAL | Status: DC

## 2021-06-07 NOTE — ED Notes (Addendum)
Pt was waiting in family consult room along with mother. Told by ED staff she was waiting for room placement back in the department. Pt was then seen leaving ED along with mother

## 2021-06-13 ENCOUNTER — Telehealth: Payer: Self-pay | Admitting: Physician Assistant

## 2021-06-13 NOTE — Telephone Encounter (Signed)
Cami is one of the Psychiatrist working with [facility]. Cami would like Maritza to call her at [phone].  Sharen Counter is currently admitted at [facility]

## 2021-07-11 ENCOUNTER — Telehealth: Payer: Self-pay

## 2021-07-11 NOTE — Telephone Encounter (Signed)
Transition Care Management Follow-up Telephone Call Date of discharge and from where: 06/13/2021 [facility] How have you been since you were released from the hospital? okay Any questions or concerns? No  Items Reviewed: Did the pt receive and understand the discharge instructions provided? Yes  Medications obtained and verified? Yes  Other? No  Any new allergies since your discharge? No  Dietary orders reviewed? No Do you have support at home?  unknown  Home Care and Equipment/Supplies: Were home health services ordered? no If so, what is the name of the agency?  Has the agency set up a time to come to the patient's home? not applicable Were any new equipment or medical supplies ordered?  No What is the name of the medical supply agency?  Were you able to get the supplies/equipment? not applicable Do you have any questions related to the use of the equipment or supplies? No  Functional Questionnaire: (I = Independent and D = Dependent) ADLs: I  Bathing/Dressing- I  Meal Prep- I  Eating- I  Maintaining continence- I  Transferring/Ambulation- I  Managing Meds- I  Follow up appointments reviewed:  PCP Hospital f/u appt confirmed? No   Specialist Hospital f/u appt confirmed? No  Are transportation arrangements needed? No  If their condition worsens, is the pt aware to call PCP or go to the Emergency Dept.? Yes Was the patient provided with contact information for the PCP's office or ED? Yes Was to pt encouraged to call back with questions or concerns? Yes  ENCOURAGED PATIENT TO CALL AND MAKE AN APPOINTMENT WITH PCP.  Tomasa Rand, RN, BSN, CEN [facility] [employer] [phone]

## 2021-07-17 ENCOUNTER — Ambulatory Visit (INDEPENDENT_AMBULATORY_CARE_PROVIDER_SITE_OTHER): Payer: 59 | Admitting: Physician Assistant

## 2021-07-17 ENCOUNTER — Other Ambulatory Visit: Payer: Self-pay

## 2021-07-17 ENCOUNTER — Encounter: Payer: Self-pay | Admitting: Physician Assistant

## 2021-07-17 VITALS — BP 126/88 | HR 80 | Temp 98.2°F | Ht 66.0 in | Wt 122.8 lb

## 2021-07-17 DIAGNOSIS — F322 Major depressive disorder, single episode, severe without psychotic features: Secondary | ICD-10-CM

## 2021-07-17 DIAGNOSIS — I4729 Other ventricular tachycardia: Secondary | ICD-10-CM

## 2021-07-17 DIAGNOSIS — Z09 Encounter for follow-up examination after completed treatment for conditions other than malignant neoplasm: Secondary | ICD-10-CM

## 2021-07-17 DIAGNOSIS — F10139 Alcohol abuse with withdrawal, unspecified: Secondary | ICD-10-CM

## 2021-07-17 DIAGNOSIS — F411 Generalized anxiety disorder: Secondary | ICD-10-CM

## 2021-07-17 DIAGNOSIS — R002 Palpitations: Secondary | ICD-10-CM

## 2021-07-17 NOTE — Progress Notes (Signed)
Established patient visit   Patient: Rachel Bennett   DOB: 12-02-1980   41 y.o. Female  MRN: [id number] Visit Date: 07/17/2021  Chief Complaint  Patient presents with   Follow-up   Subjective    HPI  Patient presents to follow up on hospital follow-up. Patient reports continues to have significant stress but has been able to deal with her ex-husband in a more calmly manner. States has had some alcohol (2-3 beers on New Year's Eve and sometimes 4 beers per week) but nothing like she did last month. Denies alcohol intoxication. Reports does have alcohol cravings. Gabapentin has helped with staying calm and not freaking out. Tried taking Gabapentin twice daily but felt more anxious. Mirtazapine has helped with sleep but has been difficult to get to sleep the past couple of days. States is undecided if going to stay here or move-in with her mother who lives in [place], [place]. States will likely move until the school year is over. States finances are difficult given her ex-husband has not been providing her or her children money.   Discharge summary: Physician Discharge Summary [facility] [place] [address] [place] [address] Dept: [phone] Loc: [phone]   Identifying Information:  Ruhee Enck 01/25/1981 [id number]  Primary Care Physician: Lorrene Reid, PA  Code Status: Full Code  Admit Date: 06/07/2021  Discharge Date: 06/13/2021   Discharge To: Home  Discharge Service: [place]: Hospitalist Service #2   Discharge Attending Physician: Miles Costain Mock, MD  Discharge Diagnoses: Principal Problem: Alcohol withdrawal syndrome without complication (CMS-HCC) POA: Yes Active Problems: Tobacco use disorder POA: Yes Resolved Problems: * No resolved hospital problems. *  Outpatient Provider Follow Up Issues:  Would benefit from ongoing efforts to address severe anxiety  Hospital Course:  Ms. Blakesley is a 41 year  old female with severe anxiety, depression, alcohol use disorder, chronic pain syndrome and uterine cancer s/p hysterectomy who presented to the ED requesting alcohol detoxification. Hospital course by problem follows:    Alcohol Use Disorder with withdrawal: Patient reports drinking 1 case (24) of White Claws per day, last drink The night prior to presentation. While in the ED, she was demonstrating signs of alcohol withdrawal and had CIWA score of 20. She was admitted to stepdown, started on scheduled Ativan and symptom triggered Ativan per CIWA. No prior detoxification. She had mild CIWA scores, rare moderate scores, but tolerated detoxification without complication. She was weaned off Ativan. Social work was consulted for outpatient resources. She has a very supportive family, with plan to stay with her brother at discharge for additional help. Additionally, psychiatry was consulted and provided recommendations and counseling   Anxiety/Depression without active SI/HI: Denies any active SI, but family reporting significant anhedonia and recent comments of passive SI.  She demonstrated psychomotor slowing, poor eye contact, anhedonia, depression mood, and withdrawn behavior during her hospitalization. She reports alcohol is her coping mechanism for the emotional stress, severe anxiety and depression from her difficult divorce. Psychiatry was consulted. She was started on Remeron with some improvement in her symptoms during her hospital stay but continued to experience anxiety so gabapentin 100 mg TID prn was added to her regimen. She will follow-up with her PCP but was also given the information to establish care with [facility] psychiatry  for ongoing assistance with management of her mental health.    Elevated liver chemistries: Consistent with alcohol use disorder. No concern for alcoholic hepatitis at this time. LFTs now normalized.     Palpitations   nonphysiologic/inappropriate sinus tachycardia: Continue  ivabradine from home supply. This is prescribed by her outpatient cardiologist   LUQ/Lower Chest Pain: Had pain over the inferior ribs on the L side. X-rays did not show acute fracture. Supportive care provided with resolution of pain. Suspect MSK in nature. Much improved, nearly resolved.    Tobacco Use: Tobacco cessation consulted. Was given nicotine patches, lozenges PRN  Touchbase with Outpatient Provider: Summary sent to PCP  Procedures: No admission procedures for hospital encounter.  Depression screen [facility] 2/9 07/17/2021 03/04/2021 01/07/2021 09/04/2020 05/14/2020  Decreased Interest 3 3 2  0 1  Down, Depressed, Hopeless 1 3 2 1 1   PHQ - 2 Score 4 6 4 1 2   Altered sleeping 3 3 3 1 2   Tired, decreased energy 3 3 1 2 3   Change in appetite 1 2 2 1 2   Feeling bad or failure about yourself  1 3 3  0 1  Trouble concentrating 3 3 2 1 1   Moving slowly or fidgety/restless 0 1 3 0 0  Suicidal thoughts 1 1 1  0 0  PHQ-9 Score 16 22 19 6 11   Difficult doing work/chores Extremely dIfficult Very difficult Very difficult - Very difficult   GAD 7 : Generalized Anxiety Score 07/17/2021 03/04/2021 01/07/2021  Nervous, Anxious, on Edge 3 3 3   Control/stop worrying 3 3 3   Worry too much - different things 3 3 3   Trouble relaxing 3 3 3   Restless 1 1 3   Easily annoyed or irritable 1 1 2   Afraid - awful might happen 3 3 3   Total GAD 7 Score 17 17 20   Anxiety Difficulty Very difficult Very difficult Very difficult      Medications: Outpatient Medications Prior to Visit  Medication Sig   Multiple Vitamins-Minerals (THERA-M) TABS Take 1 tablet by mouth daily.   acetaminophen (TYLENOL) 500 MG tablet Take 500 mg by mouth every 6 (six) hours as needed for mild pain or headache.   gabapentin (NEURONTIN) 100 MG capsule Take 100 mg by mouth 3 (three) times daily as needed.   ivabradine (CORLANOR) 5 MG TABS tablet TAKE 1 TABLET BY MOUTH TWICE A DAY WITH A MEAL   mirtazapine (REMERON) 15 MG tablet Take 30  mg by mouth at bedtime.   [DISCONTINUED] busPIRone (BUSPAR) 10 MG tablet Take 1 tablet (10 mg total) by mouth 3 (three) times daily.   [DISCONTINUED] PARoxetine (PAXIL) 10 MG tablet Take 1 tablet (10 mg total) by mouth daily.   No facility-administered medications prior to visit.    Review of Systems Review of Systems:  A fourteen system review of systems was performed and found to be positive as per HPI.     Objective    BP 126/88    Pulse 80    Temp 98.2 F (36.8 C)    Ht 5\' 6"  (1.676 m)    Wt 122 lb 12.8 oz (55.7 kg)    LMP 05/06/2020 Comment: spotting    SpO2 97%    BMI 19.82 kg/m    Physical Exam  General:  Cooperative, in no acute distress, non-diaphoretic  Neuro:  Alert and oriented,  extra-ocular muscles intact, no tremor noted  HEENT:  Normocephalic, atraumatic, neck supple  Skin:  no gross rash, warm, pink. Cardiac:  RRR,  S1 S2 Respiratory: CTA B/L  Vascular:  Ext warm, no cyanosis apprec.; cap RF less 2 sec. Psych:  No HI/SI, judgement and insight good, anxious mood. Full Affect.   No results found for any visits on 07/17/21.  Assessment & Plan     Alcohol abuse with withdrawal: -Reviewed hospital notes, labs and imaging. -Discussed with patient to resume Gabapentin 100 mg TID instead of BID and highly recommend establishing with a psychiatrist to continue to work on alcohol cessation goals. -Continue to use home support system.  Moderately severe major depression, GAD: -Discussed with patient adjusting mirtazapine and agreeable to increase dose to 30 mg. States recently had 15 mg filled so advised to start taking 2 tablets. Recommend to resume Gabapentin 100 mg TID. Will reassess mood and medication therapy in 8 weeks. Recommend to consider psychiatry referral.  NSVT/Palpitations: -Recommend to continue Ivabradine 5 mg BID.   Return in about 8 weeks (around 09/11/2021) for Mood- inc med, insomnia .        Lorrene Reid, PA-C  [facility] at  [facility] [phone] (phone) [phone] (fax)  [place]

## 2021-07-17 NOTE — Patient Instructions (Signed)
Managing Stress, Adult Feeling a certain amount of stress is normal. Stress helps our body and mind get ready to deal with the demands of life. Stress hormones can motivate you to do well at work and meet your responsibilities. But severe or long-term (chronic) stress can affect your mental and physical health. Chronic stress puts you at higher risk for: Anxiety and depression. Other health problems such as digestive problems, muscle aches, heart disease, high blood pressure, and stroke. What are the causes? Common causes of stress include: Demands from work, such as deadlines, feeling overworked, or having long hours. Pressures at home, such as money issues, disagreements with a spouse, or parenting issues. Pressures from major life changes, such as divorce, moving, loss of a loved one, or chronic illness. You may be at higher risk for stress-related problems if you: Do not get enough sleep. Are in poor health. Do not have emotional support. Have a mental health disorder such as anxiety or depression. How to recognize stress Stress can make you: Have trouble sleeping. Feel sad, anxious, irritable, or overwhelmed. Lose your appetite. Overeat or want to eat unhealthy foods. Want to use drugs or alcohol. Stress can also cause physical symptoms, such as: Sore, tense muscles, especially in the shoulders and neck. Headaches. Trouble breathing. A faster heart rate. Stomach pain, nausea, or vomiting. Diarrhea or constipation. Trouble concentrating. Follow these instructions at home: Eating and drinking Eat a healthy diet. This includes: Eating foods that are high in fiber, such as beans, whole grains, and fresh fruits and vegetables. Limiting foods that are high in fat and processed sugars, such as fried or sweet foods. Do not skip meals or overeat. Drink enough fluid to keep your urine pale yellow. Alcohol use Do not drink alcohol if: Your health care provider tells you not to  drink. You are pregnant, may be pregnant, or are planning to become pregnant. Drinking alcohol is a way some people try to ease their stress. This can be dangerous, so if you drink alcohol: Limit how much you have to: 0-1 drink a day for women. 0-2 drinks a day for men. Know how much alcohol is in your drink. In the [place] one drink equals one 12 oz bottle of beer (355 mL), one 5 oz glass of wine (148 mL), or one 1 oz glass of hard liquor (44 mL). Activity  Include 30 minutes of exercise in your daily schedule. Exercise is a good stress reducer. Include time in your day for an activity that you find relaxing. Try taking a walk, going on a bike ride, reading a book, or listening to music. Schedule your time in a way that lowers stress, and keep a regular schedule. Focus on doing what is most important to get done. Lifestyle Identify the source of your stress and your reaction to it. See a therapist who can help you change unhelpful reactions. When there are stressful events: Talk about them with family, friends, or coworkers. Try to think realistically about stressful events and not ignore them or overreact. Try to find the positives in a stressful situation and not focus on the negatives. Cut back on responsibilities at work and home, if possible. Ask for help from friends or family members if you need it. Find ways to manage stress, such as: Mindfulness, meditation, or deep breathing. Yoga or tai chi. Progressive muscle relaxation. Spending time in nature. Doing art, playing music, or reading. Making time for fun activities. Spending time with family and friends. Get support  from family, friends, or spiritual resources. °General instructions °Get enough sleep. Try to go to sleep and get up at about the same time every day. °Take over-the-counter and prescription medicines only as told by your health care provider. °Do not use any products that contain nicotine or tobacco. These products  include cigarettes, chewing tobacco, and vaping devices, such as e-cigarettes. If you need help quitting, ask your health care provider. °Do not use drugs or smoke to deal with stress. °Keep all follow-up visits. This is important. °Where to find support °Talk with your health care provider about stress management or finding a support group. °Find a therapist to work with you on your stress management techniques. °Where to find more information °National Alliance on Mental Illness: www.nami.org °American Psychological Association: www.apa.org °Contact a health care provider if: °Your stress symptoms get worse. °You are unable to manage your stress at home. °You are struggling to stop using drugs or alcohol. °Get help right away if: °You may be a danger to yourself or others. °You have any thoughts of death or suicide. °Get help right awayif you feel like you may hurt yourself or others, or have thoughts about taking your own life. Go to your nearest emergency room or: °Call 911. °Call the National Suicide Prevention Lifeline at 1-800-273-8255 or 988 in the [place] This is open 24 hours a day. °Text the Crisis Text Line at 741741. °Summary °Feeling a certain amount of stress is normal, but severe or long-term (chronic) stress can affect your mental and physical health. °Chronic stress can put you at higher risk for anxiety, depression, and other health problems such as digestive problems, muscle aches, heart disease, high blood pressure, and stroke. °You may be at higher risk for stress-related problems if you do not get enough sleep, are in poor health, lack emotional support, or have a mental health disorder such as anxiety or depression. °Identify the source of your stress and your reaction to it. Try talking about stressful events with family, friends, or coworkers, finding a coping method, or getting support from spiritual resources. °If you need more help, talk with your health care provider about finding a  support group or a mental health therapist. °This information is not intended to replace advice given to you by your health care provider. Make sure you discuss any questions you have with your health care provider. °Document Revised: 01/02/2021 Document Reviewed: 12/31/2020 °Elsevier Patient Education © 2022 Elsevier Inc. ° °

## 2021-07-29 ENCOUNTER — Other Ambulatory Visit: Payer: Self-pay

## 2021-07-29 ENCOUNTER — Telehealth: Payer: Self-pay | Admitting: Physician Assistant

## 2021-07-29 DIAGNOSIS — F10139 Alcohol abuse with withdrawal, unspecified: Secondary | ICD-10-CM

## 2021-07-29 DIAGNOSIS — F411 Generalized anxiety disorder: Secondary | ICD-10-CM

## 2021-07-29 MED ORDER — GABAPENTIN 100 MG PO CAPS: 100.0000 mg | ORAL_CAPSULE | Freq: Three times a day (TID) | ORAL | 0 refills | Status: DC | PRN

## 2021-07-29 MED ORDER — MIRTAZAPINE 15 MG PO TABS: 30.0000 mg | ORAL_TABLET | Freq: Every day | ORAL | 0 refills | Status: DC

## 2021-07-29 NOTE — Telephone Encounter (Signed)
RX refill sent to pharmacy. AS, CMA 

## 2021-07-29 NOTE — Telephone Encounter (Signed)
Patient requesting refill on Gabapentin and Remeron. Please advise. [phone]

## 2021-07-31 IMAGING — US US PELVIS COMPLETE WITH TRANSVAGINAL
1 series · 13 of 25 positions shown · non-contrast
Comparison: Prior ultrasound from 04/05/2009.

CLINICAL DATA: Initial evaluation for recent menorrhagia, history
of ovarian cysts.



[Series 1: us pelvis complete with transvaginal · 0.25mm/px · 13 of 81 slices shown]
[im 1/81]
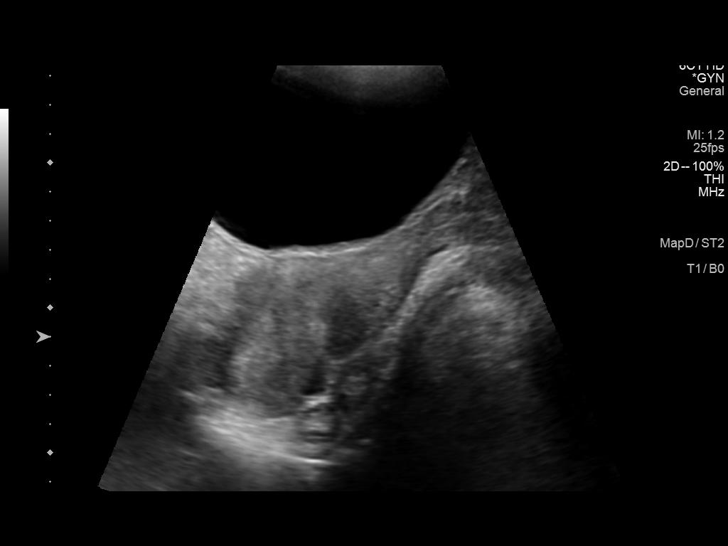
[im 7/81]
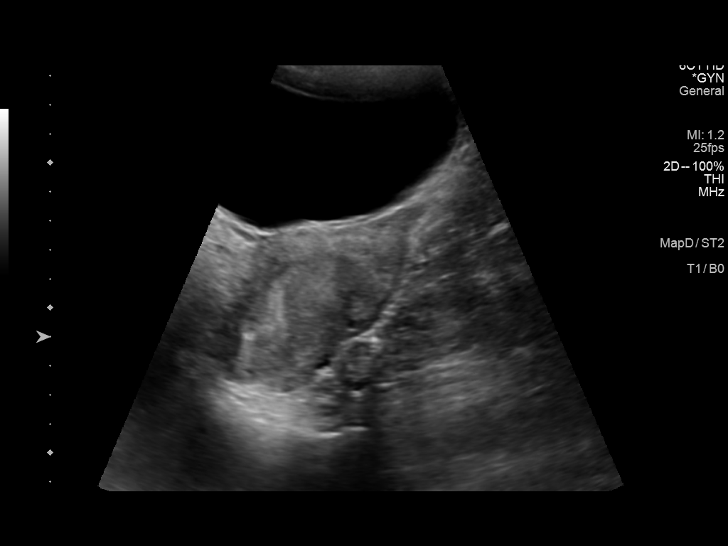
[im 14/81]
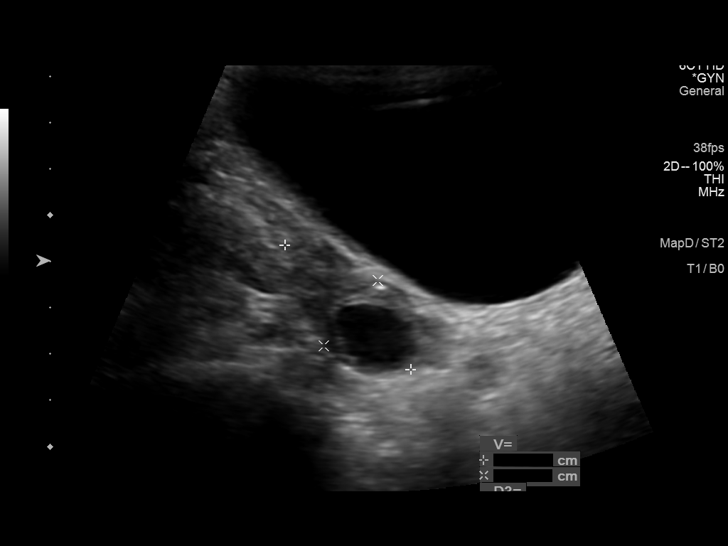
[im 21/81]
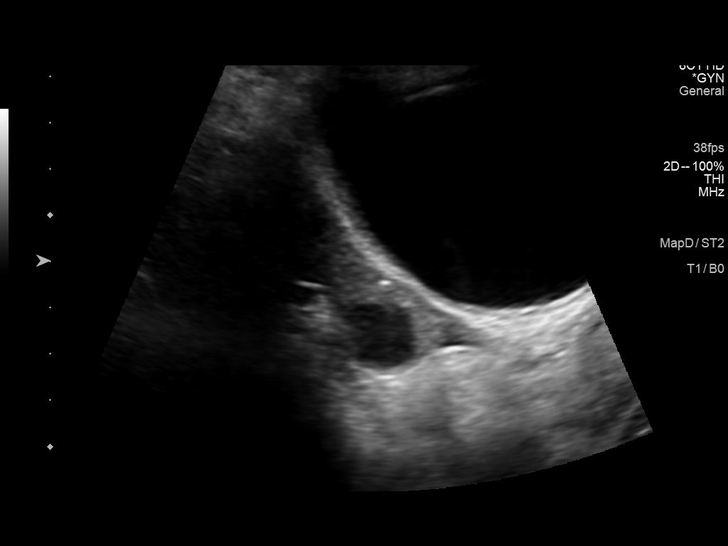
[im 27/81]
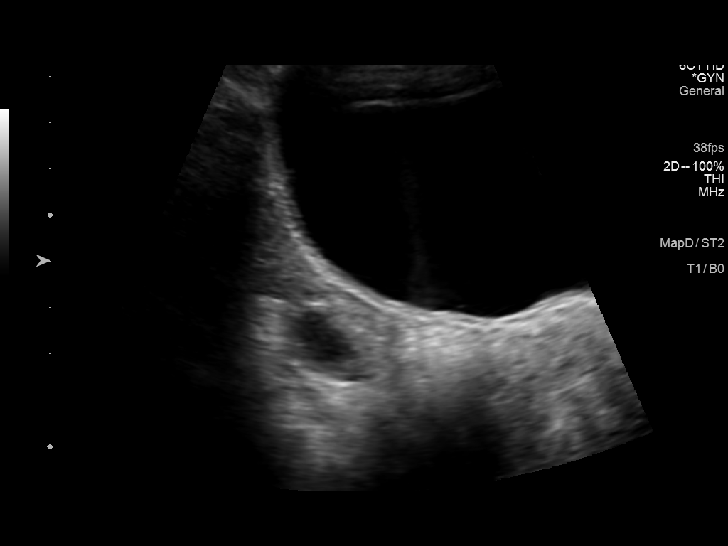
[im 34/81]
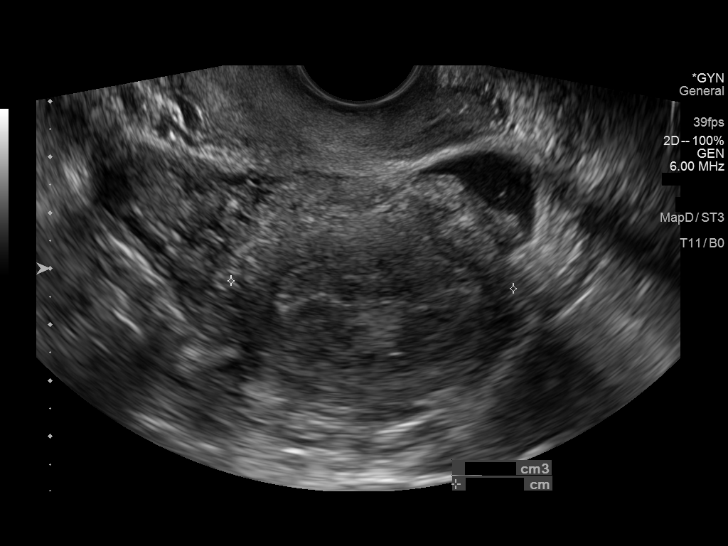
[im 41/81]
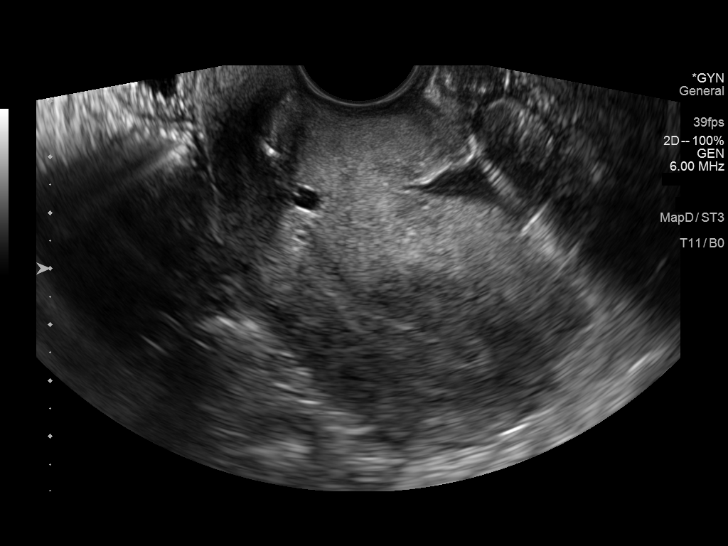
[im 47/81]
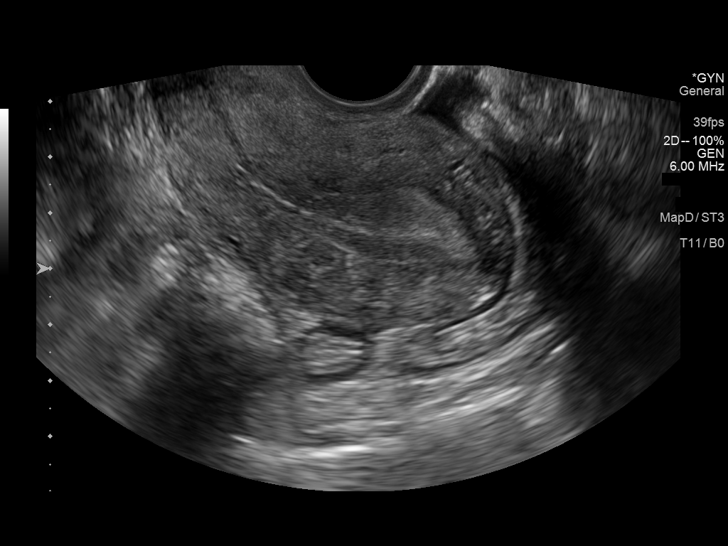
[im 54/81]
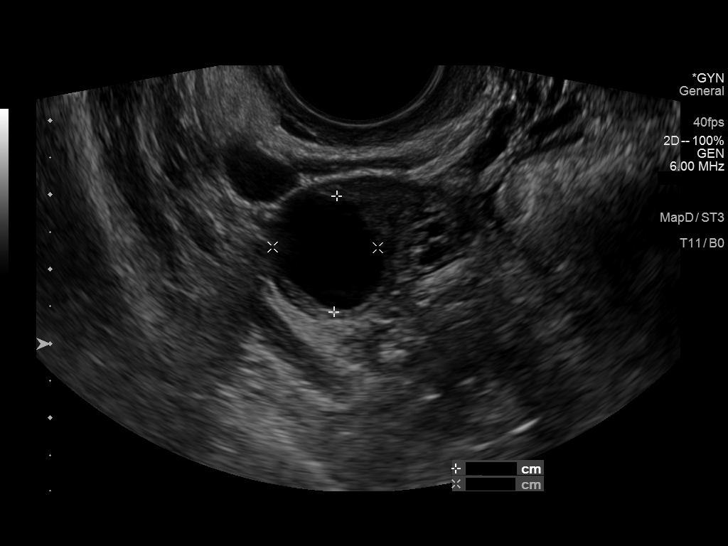
[im 61/81]
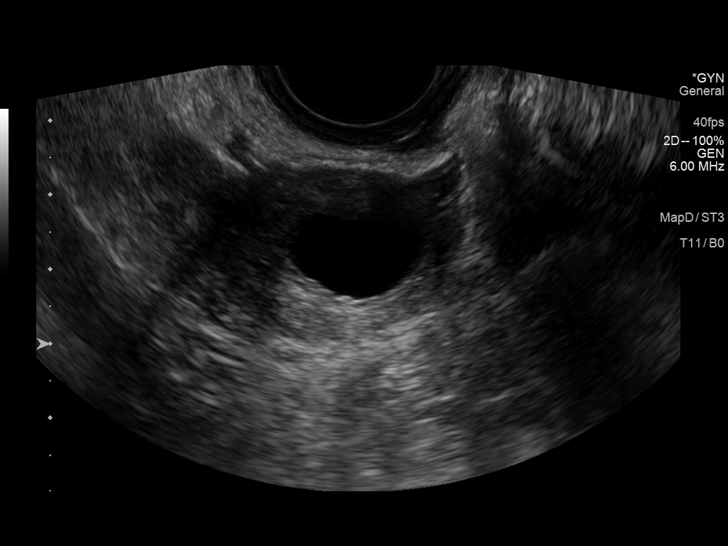
[im 67/81]
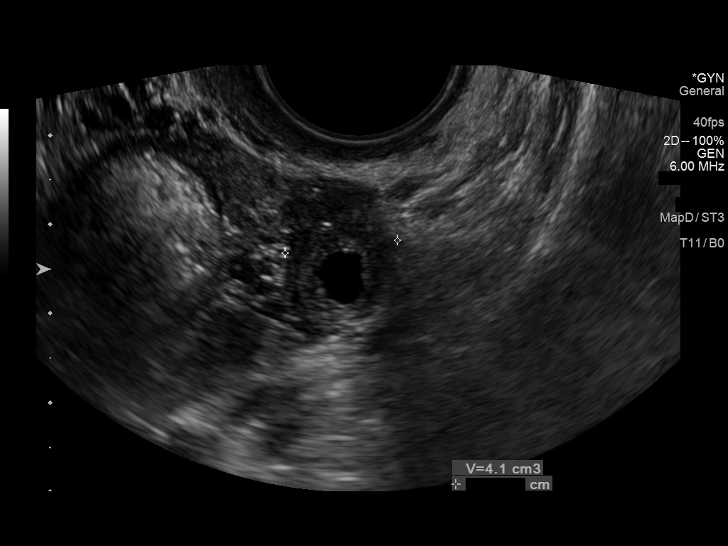
[im 74/81]
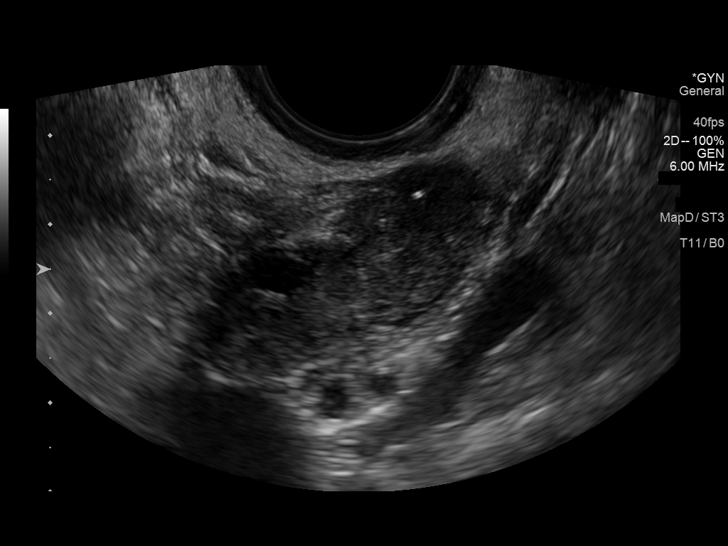
[im 81/81]
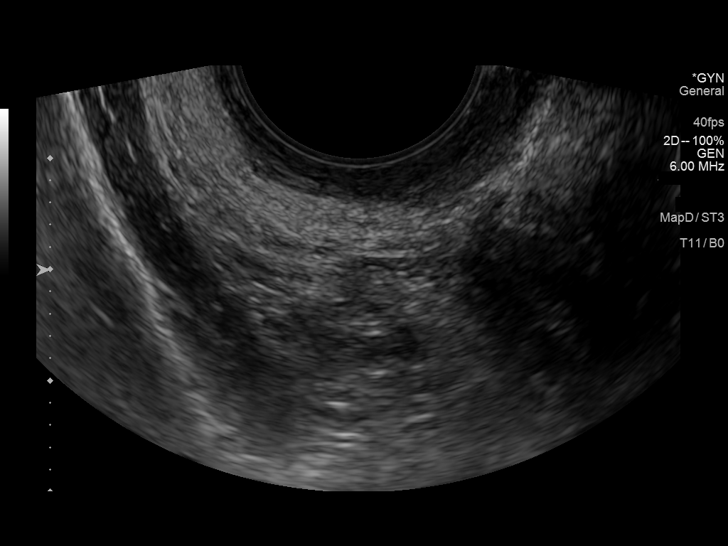

[13 of 25 positions shown; findings below may reference images not displayed]

FINDINGS: Uterus

Measurements: 8.2 x 4.6 x 5.1 cm = volume: 99.2 mL. Uterus is
retroverted. No fibroids or other mass visualized.

Endometrium

Thickness: 10.7 mm.  No focal abnormality visualized.

Right ovary

Measurements: 3.5 x 2.3 x 1.8 cm = volume: 7.6 mL. 1.6 x 1.6 x
cm simple cyst, most consistent with a normal physiologic follicular
cyst/dominant follicle.

Left ovary

Measurements: 3.6 x 1.7 x 1.3 cm = volume: 4.1 mL. 7 mm simple cyst,
most consistent with a physiologic follicular cyst/dominant
follicle.

Other findings

Trace free fluid within the pelvis.
IMPRESSION: 1. Endometrial stripe measures 10.7 mm in thickness. If bleeding
remains unresponsive to hormonal or medical therapy, sonohysterogram
should be considered for focal lesion work-up. (Ref: Radiological
Reasoning: Algorithmic Workup of Abnormal Vaginal Bleeding with
Endovaginal Sonography and Sonohysterography. AJR [id number]; 191:[id number]).
2. 1.6 cm simple right ovarian cyst, most consistent with a normal
physiologic follicular cyst/dominant follicle. Associated trace free
fluid within the pelvis.
3. Otherwise unremarkable and normal pelvic ultrasound for age.

## 2021-08-07 ENCOUNTER — Telehealth: Payer: Self-pay

## 2021-08-07 NOTE — Telephone Encounter (Signed)
PA started for Corlanor 5 mg on CMM. Key [id number]

## 2021-08-07 NOTE — Telephone Encounter (Signed)
PA approved for Corlanor 5 mg through 08/07/22.

## 2021-09-10 ENCOUNTER — Other Ambulatory Visit: Payer: Self-pay | Admitting: Cardiology

## 2021-09-11 ENCOUNTER — Other Ambulatory Visit: Payer: Self-pay

## 2021-09-11 ENCOUNTER — Encounter: Payer: Self-pay | Admitting: Physician Assistant

## 2021-09-11 ENCOUNTER — Ambulatory Visit (INDEPENDENT_AMBULATORY_CARE_PROVIDER_SITE_OTHER): Payer: Commercial Managed Care - PPO | Admitting: Physician Assistant

## 2021-09-11 VITALS — BP 120/87 | HR 92 | Temp 97.9°F | Ht 65.5 in | Wt 127.0 lb

## 2021-09-11 DIAGNOSIS — F10930 Alcohol use, unspecified with withdrawal, uncomplicated: Secondary | ICD-10-CM

## 2021-09-11 DIAGNOSIS — F99 Mental disorder, not otherwise specified: Secondary | ICD-10-CM | POA: Insufficient documentation

## 2021-09-11 DIAGNOSIS — R002 Palpitations: Secondary | ICD-10-CM | POA: Diagnosis not present

## 2021-09-11 DIAGNOSIS — F322 Major depressive disorder, single episode, severe without psychotic features: Secondary | ICD-10-CM

## 2021-09-11 DIAGNOSIS — F5105 Insomnia due to other mental disorder: Secondary | ICD-10-CM

## 2021-09-11 DIAGNOSIS — F411 Generalized anxiety disorder: Secondary | ICD-10-CM | POA: Diagnosis not present

## 2021-09-11 DIAGNOSIS — I4729 Other ventricular tachycardia: Secondary | ICD-10-CM | POA: Diagnosis not present

## 2021-09-11 MED ORDER — MIRTAZAPINE 45 MG PO TABS: 45.0000 mg | ORAL_TABLET | Freq: Every day | ORAL | 2 refills | Status: DC

## 2021-09-11 MED ORDER — METOPROLOL SUCCINATE ER 25 MG PO TB24: 12.5000 mg | ORAL_TABLET | Freq: Every day | ORAL | 2 refills | Status: DC

## 2021-09-11 NOTE — Assessment & Plan Note (Signed)
-  Unchanged from prior. Will increase Mirtazapine to 45 mg. Will reassess sleep and medication therapy in 8 weeks. ?

## 2021-09-11 NOTE — Assessment & Plan Note (Signed)
-  Patient endorses alcohol use and denies binge drinking. Discussed with patient if alcohol cravings increase then recommend increasing Gabapentin to 200 mg BID. Pt verbalized understanding and will let me know. Denies referral to [facility].  ?

## 2021-09-11 NOTE — Assessment & Plan Note (Signed)
-  H/o NSVT. On ivabradine 5 mg BID- managed by cardiology. Rate today 98 bpm. Discussed with patient adding low dose beta blocker and is agreeable. Will start metoprolol succinate 12.5 mg once daily. Will reassess symptoms and medication therapy at f/up visit.  ?

## 2021-09-11 NOTE — Progress Notes (Signed)
?Established patient visit ? ? ?Patient: Rachel Bennett   DOB: 29-May-1981   41 y.o. Female  MRN: [id number] ?Visit Date: 09/11/2021 ? ?Chief Complaint  ?Patient presents with  ? Follow-up  ?  Mood  ? Insomnia  ?   ?  ? ?Subjective  ?  ?HPI ?HPI   ? ? Follow-up   ? Additional comments: Mood ? ?  ?  ? ? Insomnia   ? Additional comments:  ? ? ?  ?  ?Last edited by Aron Baba, CMA on 09/11/2021  2:18 PM.  ?  ?  ?Patient presents for follow up on mood. States continues to feel anxious and depressed, especially when has communication with her ex-husband. Denies SI/HI. Also continues to struggle sleep. Sleeps 3-4 hrs at night and once she wakes up unable to go back to sleep. Also reports having more palpitations and has not missed doses of Corlanor.    ? ?Alcohol use: Drinking 2-3 beers on the weekends when her children are with their dad. Reports taking Gabapentin twice daily. Forgets to take third dose. ? ? ? ? ?  09/11/2021  ?  2:19 PM 07/17/2021  ?  4:16 PM 03/04/2021  ?  1:51 PM 01/07/2021  ?  9:32 AM 09/04/2020  ? 10:24 AM  ?Depression screen PHQ 2/9  ?Decreased Interest '3 3 3 2 '$ 0  ?Down, Depressed, Hopeless '3 1 3 2 1  '$ ?PHQ - 2 Score '6 4 6 4 1  '$ ?Altered sleeping '3 3 3 3 1  '$ ?Tired, decreased energy '3 3 3 1 2  '$ ?Change in appetite '1 1 2 2 1  '$ ?Feeling bad or failure about yourself  '1 1 3 3 '$ 0  ?Trouble concentrating '1 3 3 2 1  '$ ?Moving slowly or fidgety/restless 0 0 1 3 0  ?Suicidal thoughts 0 '1 1 1 '$ 0  ?PHQ-9 Score '15 16 22 19 6  '$ ?Difficult doing work/chores Very difficult Extremely dIfficult Very difficult Very difficult   ? ? ?  09/11/2021  ?  2:19 PM 07/17/2021  ?  4:16 PM 03/04/2021  ?  1:51 PM 01/07/2021  ?  9:32 AM  ?GAD 7 : Generalized Anxiety Score  ?Nervous, Anxious, on Edge '3 3 3 3  '$ ?Control/stop worrying '3 3 3 3  '$ ?Worry too much - different things '3 3 3 3  '$ ?Trouble relaxing '3 3 3 3  '$ ?Restless '1 1 1 3  '$ ?Easily annoyed or irritable '1 1 1 2  '$ ?Afraid - awful might happen '1 3 3 3  '$ ?Total GAD 7 Score '15 17 17 20   '$ ?Anxiety Difficulty Very difficult Very difficult Very difficult Very difficult  ? ? ?  ? ? ?Medications: ?Outpatient Medications Prior to Visit  ?Medication Sig  ? acetaminophen (TYLENOL) 500 MG tablet Take 500 mg by mouth every 6 (six) hours as needed for mild pain or headache.  ? gabapentin (NEURONTIN) 100 MG capsule Take 1 capsule (100 mg total) by mouth 3 (three) times daily as needed.  ? ivabradine (CORLANOR) 5 MG TABS tablet TAKE 1 TABLET BY MOUTH TWICE A DAY WITH MEALS  ? Multiple Vitamins-Minerals (THERA-M) TABS Take 1 tablet by mouth daily.  ? [DISCONTINUED] mirtazapine (REMERON) 15 MG tablet Take 2 tablets (30 mg total) by mouth at bedtime.  ? ?No facility-administered medications prior to visit.  ? ? ?Review of Systems ?Review of Systems:  ?A fourteen system review of systems was performed and found to be positive as per HPI. ? ? ? ?  Objective  ?  ?BP 120/87   Pulse 92   Temp 97.9 ?F (36.6 ?C)   Ht 5' 5.5" (1.664 m)   Wt 127 lb (57.6 kg)   LMP 05/06/2020 Comment: spotting   SpO2 98%   BMI 20.81 kg/m?  ?BP Readings from Last 3 Encounters:  ?09/11/21 120/87  ?07/17/21 126/88  ?06/07/21 (!) 133/109  ? ?Wt Readings from Last 3 Encounters:  ?09/11/21 127 lb (57.6 kg)  ?07/17/21 122 lb 12.8 oz (55.7 kg)  ?06/06/21 100 lb (45.4 kg)  ? ? ?Physical Exam  ?General:  Well Developed, well nourished, appropriate for stated age.  ?Neuro:  Alert and oriented,  extra-ocular muscles intact  ?HEENT:  Normocephalic, atraumatic, neck supple  ?Skin:  no gross rash, warm, pink. ?Cardiac:  Reg rhythm with slight tachycardia, S1 S2 ?Respiratory: CTA B/L  ?Vascular:  Ext warm, no cyanosis apprec.; cap RF less 2 sec. ?Psych:  No HI/SI, judgement and insight good, Euthymic mood. Full Affect. ? ? ?No results found for any visits on 09/11/21. ? Assessment & Plan  ?  ? ? ?Problem List Items Addressed This Visit   ? ?  ? Cardiovascular and Mediastinum  ? NSVT (nonsustained ventricular tachycardia)  ? Relevant Medications  ?  metoprolol succinate (TOPROL-XL) 25 MG 24 hr tablet  ?  ? Nervous and Auditory  ? Alcohol withdrawal syndrome without complication ([place])  ?  -Patient endorses alcohol use and denies binge drinking. Discussed with patient if alcohol cravings increase then recommend increasing Gabapentin to 200 mg BID. Pt verbalized understanding and will let me know. Denies referral to [facility].  ?  ?  ?  ? Other  ? Palpitations  ?  -H/o NSVT. On ivabradine 5 mg BID- managed by cardiology. Rate today 98 bpm. Discussed with patient adding low dose beta blocker and is agreeable. Will start metoprolol succinate 12.5 mg once daily. Will reassess symptoms and medication therapy at f/up visit.  ?  ?  ? Relevant Medications  ? metoprolol succinate (TOPROL-XL) 25 MG 24 hr tablet  ? GAD (generalized anxiety disorder)  ?  -Uncontrolled. Discussed with patient increasing Mirtazapine to 45 mg and is agreeable. Will reassess symptoms and medication therapy in 8 weeks. Declined BH referral due to financial cost.  ?  ?  ? Relevant Medications  ? mirtazapine (REMERON) 45 MG tablet  ? 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  -Unchanged from prior visit. Patient able to contract safety, denies SI/HI. Will increase Mirtazapine to 45 mg. Declined referral to [facility]. Will reassess symptoms and medication therapy in 8 weeks.  ?  ?  ? Relevant Medications  ? mirtazapine (REMERON) 45 MG tablet  ? Insomnia due to other mental disorder  ?  -Unchanged from prior. Will increase Mirtazapine to 45 mg. Will reassess sleep and medication therapy in 8 weeks. ?  ?  ? ? ?Return in about 2 months (around 11/11/2021) for Mood- inc med, insomnia, etoh.  ?   ? ? ? ?Lorrene Reid, PA-C  ?[facility] ?[phone] (phone) ?[phone] (fax) ? ?[facility] ?

## 2021-09-11 NOTE — Assessment & Plan Note (Signed)
-  Uncontrolled. Discussed with patient increasing Mirtazapine to 45 mg and is agreeable. Will reassess symptoms and medication therapy in 8 weeks. Declined BH referral due to financial cost.  ?

## 2021-09-11 NOTE — Assessment & Plan Note (Signed)
-  Unchanged from prior visit. Patient able to contract safety, denies SI/HI. Will increase Mirtazapine to 45 mg. Declined referral to [facility]. Will reassess symptoms and medication therapy in 8 weeks.  ?

## 2021-09-11 NOTE — Patient Instructions (Signed)
Alcohol Use Disorder ?Alcohol use disorder is a condition in which drinking disrupts daily life. People with this condition drink too much alcohol and cannot control their drinking. ?Alcohol use disorder can cause serious problems with physical health. It can affect the brain, heart, and other internal organs. This disorder can raise the risk for certain cancers and cause problems with mental health, such as depression or anxiety. ?What are the causes? ?This condition is caused by drinking too much alcohol over time. Some people with this condition drink to cope with or escape from negative life events. Others drink to relieve pain or symptoms of mental illness. ?What increases the risk? ?You are more likely to develop this condition if: ?You have a family history of alcohol use disorder. ?Your culture encourages drinking to the point of becoming drunk (intoxication). ?You had a mood or conduct disorder in childhood. ?You have been abused. ?You are an adolescent and you: ?Have poor performance in school. ?Have poor supervision or guidance. ?Act on impulse and like taking risks. ?What are the signs or symptoms? ?Symptoms of this condition include: ?Drinking more than you want to. ?Trying several times without success to drink less. ?Spending a lot of time thinking about alcohol, getting alcohol, drinking, or recovering from drinking. ?Continuing to drink even when it is causing serious problems in your daily life. ?Drinking when it is dangerous to drink, such as before driving a car. ?Needing more and more alcohol to get the same effect you want (building up tolerance). ?Having symptoms of withdrawal when you stop drinking. Withdrawal symptoms may include: ?Trouble sleeping, leading to tiredness (fatigue). ?Mood swings of depression and anxiety. ?Physical symptoms, such as a fast heart rate, rapid breathing, high blood pressure (hypertension), fever, cold sweats, or nausea. ?Seizures. ?Severe confusion. ?Feeling or  seeing things that are not there (hallucinations). ?Shaking movements that you cannot control (tremors). ?How is this diagnosed? ?This condition is diagnosed with an assessment. Your health care provider may start by asking three or four questions about your drinking, or he or she may give you a simple test to take. This helps to get clear information from you. ?You may also have a physical exam or lab tests. You may be referred to a substance abuse counselor. ?How is this treated? ?With education, some people with alcohol use disorder are able to reduce their drinking. Many with this disorder cannot change their drinking behavior on their own and need help from substance use specialists. These specialists are counselors who can help diagnose how severe your disorder is and what type of treatment you need. Treatments may include: ?Detoxification. Detoxification involves quitting drinking with supervision and direction of health care providers. Your health care provider may prescribe prescription medicines within the first week to help lessen withdrawal symptoms. Alcohol withdrawal can be dangerous and life-threatening. Detoxification may be provided in a home, community, or primary care setting, or in a hospital or substance use treatment facility. ?Counseling. This may involve motivational interviewing (MI), family therapy, or cognitive behavioral therapy (CBT). It is provided by substance use treatment counselors or professional therapists. A counselor can address the things you can do to change your drinking behavior and how to maintain the changes. Talk therapy aims to: ?Identify your positive motivations to change. ?Identify and avoid the things that trigger your drinking. ?Help you learn how to plan your behavior change. ?Develop support systems that can help you sustain the change. ?Medicines. Medicines can help treat this disorder by: ?Decreasing cravings. ?Decreasing the  positive feeling you have when you  drink. ?Causing an uncomfortable physical reaction when you drink (aversion therapy). ?Mutual help groups such as Alcoholics Anonymous (AA). These groups are led by people who have quit drinking. The groups provide emotional support, advice, and guidance. ?Some people with this condition benefit from a combination of treatments provided by specialized substance use treatment centers. ?Follow these instructions at home: ?Medicines ?Take over-the-counter and prescription medicines only as told by your health care provider. ?Ask before starting any new medicines, herbs, or supplements. ?General instructions ?Ask friends and family members to support your choice to stay sober. ?Avoid situations where alcohol is served. ?Create a plan to deal with tempting situations. ?Attend support groups regularly. ?Practice hobbies or activities you enjoy. ?Do not drink and drive. ?Keep all follow-up visits as told by your health care provider. This is important. ?How is this prevented? ?If you drink alcohol: ?Limit how much you use to: ?0-1 drink a day for nonpregnant women. ?0-2 drinks a day for men. ?Be aware of how much alcohol is in your drink. In the [place] one drink equals one 12 oz bottle of beer (355 mL), one 5 oz glass of wine (148 mL), or one 1? oz glass of hard liquor (44 mL). ?If you have a mental health condition, seek treatment. Develop a healthy lifestyle through: ?Meditation or deep breathing. ?Exercise. ?Spending time in nature. ?Listening to music. ?Talking with a trusted friend or family member. ?If you are an adolescent: ?Do not drink alcohol. Avoid gatherings where you might be tempted. ?Do not be afraid to say no if someone offers you alcohol. Speak up about why you do not want to drink. Set a positive example for others around you by not drinking. ?Build relationships with friends who do not drink. ?Where to find more information ?Substance Abuse and Mental Health Services Administration: SamedayNews.com.cy ?Alcoholics  Anonymous: ShedSizes.ch ?Contact a health care provider if: ?You cannot take your medicines as told. ?Your symptoms get worse or you experience symptoms of withdrawal when you stop drinking. ?You start drinking again (relapse) and your symptoms get worse. ?Get help right away if: ?You have thoughts about hurting yourself or others. ?If you ever feel like you may hurt yourself or others, or have thoughts about taking your own life, get help right away. Go to your nearest emergency department or: ?Call your local emergency services (911 in the [place] ?Call a suicide crisis helpline, such as the [place] at 639-053-0110 or 988 in the [place]. This is open 24 hours a day in the [place] ?Text the Crisis Text Line at 541-464-2015 (in the [place].). ?Summary ?Alcohol use disorder is a condition in which drinking disrupts daily life. People with this condition drink too much alcohol and cannot control their drinking. ?Treatment may include detoxification, counseling, medicines, and support groups. ?Ask friends and family members to support you. Avoid situations where alcohol is served. ?Get help right away if you have thoughts about hurting yourself or others. ?This information is not intended to replace advice given to you by your health care provider. Make sure you discuss any questions you have with your health care provider. ?Document Revised: 01/01/2021 Document Reviewed: 04/27/2019 ?Elsevier Patient Education ? [place]. ? ?

## 2021-11-07 NOTE — Patient Instructions (Signed)

## 2021-11-10 ENCOUNTER — Encounter: Payer: Self-pay | Admitting: Physician Assistant

## 2021-11-10 ENCOUNTER — Ambulatory Visit (INDEPENDENT_AMBULATORY_CARE_PROVIDER_SITE_OTHER): Payer: Commercial Managed Care - PPO | Admitting: Physician Assistant

## 2021-11-10 VITALS — BP 124/82 | HR 100 | Temp 97.7°F | Ht 60.5 in | Wt 136.0 lb

## 2021-11-10 DIAGNOSIS — F322 Major depressive disorder, single episode, severe without psychotic features: Secondary | ICD-10-CM

## 2021-11-10 DIAGNOSIS — F5105 Insomnia due to other mental disorder: Secondary | ICD-10-CM

## 2021-11-10 DIAGNOSIS — F411 Generalized anxiety disorder: Secondary | ICD-10-CM

## 2021-11-10 DIAGNOSIS — R002 Palpitations: Secondary | ICD-10-CM | POA: Diagnosis not present

## 2021-11-10 DIAGNOSIS — F99 Mental disorder, not otherwise specified: Secondary | ICD-10-CM

## 2021-11-10 DIAGNOSIS — F1010 Alcohol abuse, uncomplicated: Secondary | ICD-10-CM | POA: Diagnosis not present

## 2021-11-10 MED ORDER — GABAPENTIN 100 MG PO CAPS: 200.0000 mg | ORAL_CAPSULE | Freq: Two times a day (BID) | ORAL | 2 refills | Status: DC

## 2021-11-10 NOTE — Assessment & Plan Note (Signed)
-  Discussed with patient improving medication adherence with mirtazapine 45 mg which will also help with sleep. Will continue to monitor.

## 2021-11-10 NOTE — Assessment & Plan Note (Signed)
-  GAD-7 score better today than previously. Discussed with patient improving medication adherence with mirtazapine 45 mg. Will continue to monitor.

## 2021-11-10 NOTE — Progress Notes (Signed)
Established patient visit   Patient: Rachel Bennett   DOB: 01-08-1981   41 y.o. Female  MRN: [id number] Visit Date: 11/10/2021  Chief Complaint  Patient presents with   Depression   Anxiety   Subjective    HPI  Patient presents for follow-up on mood. At last visit mirtazapine was increased to 45 mg. Patient reports has been inconsistent with medication. States has been forgetting to take medication before bedtime. Also reports did not start metoprolol succinate because she forgot about that prescription but thinks she did get it from the pharmacy. Her sleep varies. Some nights does not have trouble sleeping and other nights she does. States her stress is higher than previously with her divorce, lack of financial assistance and family matters. Falls asleep easier when she takes medication (mirtazapine) but still wakes-up throughout the night. Patient states she continues to drink, 1 hard seltzer daily and on the weekends when her children are with their father she drinks 4-5. Reports taking Gabapentin 100 mg TID. Her palpitations are better.        11/10/2021   10:38 AM 09/11/2021    2:19 PM 07/17/2021    4:16 PM 03/04/2021    1:51 PM 01/07/2021    9:32 AM  Depression screen PHQ 2/9  Decreased Interest '2 3 3 3 2  '$ Down, Depressed, Hopeless '1 3 1 3 2  '$ PHQ - 2 Score '3 6 4 6 4  '$ Altered sleeping '3 3 3 3 3  '$ Tired, decreased energy '3 3 3 3 1  '$ Change in appetite '3 1 1 2 2  '$ Feeling bad or failure about yourself  '1 1 1 3 3  '$ Trouble concentrating '1 1 3 3 2  '$ Moving slowly or fidgety/restless 1 0 0 1 3  Suicidal thoughts 0 0 '1 1 1  '$ PHQ-9 Score '15 15 16 22 19  '$ Difficult doing work/chores Very difficult Very difficult Extremely dIfficult Very difficult Very difficult      11/10/2021   10:38 AM 09/11/2021    2:19 PM 07/17/2021    4:16 PM 03/04/2021    1:51 PM  GAD 7 : Generalized Anxiety Score  Nervous, Anxious, on Edge '2 3 3 3  '$ Control/stop worrying '1 3 3 3  '$ Worry too much - different things  '2 3 3 3  '$ Trouble relaxing '1 3 3 3  '$ Restless 0 '1 1 1  '$ Easily annoyed or irritable 0 '1 1 1  '$ Afraid - awful might happen '1 1 3 3  '$ Total GAD 7 Score '7 15 17 17  '$ Anxiety Difficulty Very difficult Very difficult Very difficult Very difficult        Medications: Outpatient Medications Prior to Visit  Medication Sig   acetaminophen (TYLENOL) 500 MG tablet Take 500 mg by mouth every 6 (six) hours as needed for mild pain or headache.   gabapentin (NEURONTIN) 100 MG capsule Take 1 capsule (100 mg total) by mouth 3 (three) times daily as needed.   ivabradine (CORLANOR) 5 MG TABS tablet TAKE 1 TABLET BY MOUTH TWICE A DAY WITH MEALS   metoprolol succinate (TOPROL-XL) 25 MG 24 hr tablet Take 0.5 tablets (12.5 mg total) by mouth daily.   mirtazapine (REMERON) 45 MG tablet Take 1 tablet (45 mg total) by mouth at bedtime.   Multiple Vitamins-Minerals (THERA-M) TABS Take 1 tablet by mouth daily.   No facility-administered medications prior to visit.    Review of Systems Review of Systems:  A fourteen system review of systems was performed  and found to be positive as per HPI.  Last CBC Lab Results  Component Value Date   WBC 5.1 06/06/2021   HGB 16.2 (H) 06/06/2021   HCT 48.0 (H) 06/06/2021   MCV 97.6 06/06/2021   MCH 32.9 06/06/2021   RDW 16.6 (H) 06/06/2021   PLT 294 78/46/9629   Last metabolic panel Lab Results  Component Value Date   GLUCOSE 82 06/06/2021   NA 140 06/06/2021   K 3.6 06/06/2021   CL 101 06/06/2021   CO2 22 06/06/2021   BUN 8 06/06/2021   CREATININE 0.30 (L) 06/06/2021   GFRNONAA >60 06/06/2021   CALCIUM 8.7 (L) 06/06/2021   PHOS 3.8 04/18/2020   PROT 9.1 (H) 06/06/2021   ALBUMIN 5.0 06/06/2021   LABGLOB 3.2 07/24/2020   AGRATIO 1.5 07/24/2020   BILITOT 1.1 06/06/2021   ALKPHOS 85 06/06/2021   AST 42 (H) 06/06/2021   ALT 23 06/06/2021   ANIONGAP 17 (H) 06/06/2021   Last lipids Lab Results  Component Value Date   CHOL 226 (H) 04/08/2020   HDL 36 (L)  04/08/2020   LDLCALC 174 (H) 04/08/2020   TRIG 91 04/08/2020   CHOLHDL 6.3 (H) 04/08/2020   Last hemoglobin A1c Lab Results  Component Value Date   HGBA1C 5.8 (H) 04/08/2020   Last thyroid functions Lab Results  Component Value Date   TSH 3.620 07/24/2020   T3TOTAL 185 (H) 07/24/2020   T4TOTAL 7.9 04/08/2020   Last vitamin D Lab Results  Component Value Date   VD25OH 19.0 (L) 04/08/2020     Objective    BP 124/82   Pulse 100   Temp 97.7 F (36.5 C)   Ht 5' 0.5" (1.537 m)   Wt 136 lb (61.7 kg)   LMP 05/06/2020 Comment: spotting   SpO2 95%   BMI 26.12 kg/m  BP Readings from Last 3 Encounters:  11/10/21 124/82  09/11/21 120/87  07/17/21 126/88   Wt Readings from Last 3 Encounters:  11/10/21 136 lb (61.7 kg)  09/11/21 127 lb (57.6 kg)  07/17/21 122 lb 12.8 oz (55.7 kg)    Physical Exam  General:  Well Developed, well nourished, appropriate for stated age.  Neuro:  Alert and oriented,  extra-ocular muscles intact  HEENT:  Normocephalic, atraumatic, neck supple  Skin:  no gross rash, warm, pink. Cardiac:  RRR, S1 S2 Respiratory: CTA B/L w/o wheezing.  Vascular:  Ext warm, no cyanosis apprec.; cap RF less 2 sec. Psych:  No HI/SI, judgement and insight good, anxious mood. Full Affect.   No results found for any visits on 11/10/21.  Assessment & Plan      Problem List Items Addressed This Visit       Other   Palpitations    -Improved. Discussed with patient palpitations likely multi-factorial from stress, anxiety, and elevated heart rate. Heart rate today 100 bpm. Discussed with patient starting metoprolol succinate 12.5 mg daily and monitoring her BP/heart rate at home few times per week. Patient has hx of NSVT and on Ivabradine 5 mg BID by cardiology. Will continue to monitor.        GAD (generalized anxiety disorder)    -GAD-7 score better today than previously. Discussed with patient improving medication adherence with mirtazapine 45 mg. Will continue  to monitor.       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 No SI/HI. Discussed with patient improving medication adherence and recommend to use  a pill box to help remember to taker her medication daily. Patient reports unable to afford [place] therapy at this time. Will continue to monitor.       Insomnia due to other mental disorder    -Discussed with patient improving medication adherence with mirtazapine 45 mg which will also help with sleep. Will continue to monitor.       Other Visit Diagnoses     Alcohol abuse       Relevant Medications   gabapentin (NEURONTIN) 100 MG capsule      Alcohol abuse: -Discussed with patient increasing Gabapentin to 200 mg BID as previously discussed. Will send new rx. Discontinue Gabapentin 100 mg TID. Recommend to limit 1 drink per day and eventually be able to abstain from alcohol use. Will continue to monitor and reassess medication therapy at f/up visit.   Return in about 3 months (around 02/10/2022) for Mood, insomnia, etoh .        Lorrene Reid, PA-C  [facility] at [facility] [phone] (phone) [phone] (fax)  [place]

## 2021-11-10 NOTE — Assessment & Plan Note (Addendum)
-  Not much improvement since last visit. No SI/HI. Discussed with patient improving medication adherence and recommend to use a pill box to help remember to taker her medication daily. Patient reports unable to afford [place] therapy at this time. Will continue to monitor.

## 2021-11-10 NOTE — Assessment & Plan Note (Signed)
-  Improved. Discussed with patient palpitations likely multi-factorial from stress, anxiety, and elevated heart rate. Heart rate today 100 bpm. Discussed with patient starting metoprolol succinate 12.5 mg daily and monitoring her BP/heart rate at home few times per week. Patient has hx of NSVT and on Ivabradine 5 mg BID by cardiology. Will continue to monitor.

## 2021-12-07 ENCOUNTER — Other Ambulatory Visit: Payer: Self-pay | Admitting: Physician Assistant

## 2021-12-07 DIAGNOSIS — R002 Palpitations: Secondary | ICD-10-CM

## 2021-12-07 DIAGNOSIS — F411 Generalized anxiety disorder: Secondary | ICD-10-CM

## 2021-12-07 DIAGNOSIS — F322 Major depressive disorder, single episode, severe without psychotic features: Secondary | ICD-10-CM

## 2021-12-07 DIAGNOSIS — I4729 Other ventricular tachycardia: Secondary | ICD-10-CM

## 2022-02-10 ENCOUNTER — Ambulatory Visit (INDEPENDENT_AMBULATORY_CARE_PROVIDER_SITE_OTHER): Payer: Commercial Managed Care - PPO | Admitting: Physician Assistant

## 2022-02-10 ENCOUNTER — Encounter: Payer: Self-pay | Admitting: Physician Assistant

## 2022-02-10 VITALS — BP 114/80 | HR 91 | Temp 97.7°F | Ht 65.5 in | Wt 146.0 lb

## 2022-02-10 DIAGNOSIS — I4729 Other ventricular tachycardia: Secondary | ICD-10-CM

## 2022-02-10 DIAGNOSIS — F322 Major depressive disorder, single episode, severe without psychotic features: Secondary | ICD-10-CM | POA: Diagnosis not present

## 2022-02-10 DIAGNOSIS — F99 Mental disorder, not otherwise specified: Secondary | ICD-10-CM

## 2022-02-10 DIAGNOSIS — F5105 Insomnia due to other mental disorder: Secondary | ICD-10-CM

## 2022-02-10 DIAGNOSIS — F411 Generalized anxiety disorder: Secondary | ICD-10-CM | POA: Diagnosis not present

## 2022-02-10 DIAGNOSIS — F1010 Alcohol abuse, uncomplicated: Secondary | ICD-10-CM | POA: Diagnosis not present

## 2022-02-10 DIAGNOSIS — Z720 Tobacco use: Secondary | ICD-10-CM

## 2022-02-10 MED ORDER — IVABRADINE HCL 5 MG PO TABS: 5.0000 mg | ORAL_TABLET | Freq: Two times a day (BID) | ORAL | 2 refills | Status: DC

## 2022-02-10 MED ORDER — GABAPENTIN 100 MG PO CAPS: 200.0000 mg | ORAL_CAPSULE | Freq: Two times a day (BID) | ORAL | 2 refills | Status: DC

## 2022-02-10 MED ORDER — VARENICLINE TARTRATE 0.5 MG X 11 & 1 MG X 42 PO TBPK: ORAL_TABLET | ORAL | 0 refills | Status: DC

## 2022-02-10 NOTE — Progress Notes (Signed)
Established patient visit   Patient: Rachel Bennett   DOB: 07-05-1980   41 y.o. Female  MRN: [id number] Visit Date: 02/10/2022  Chief Complaint  Patient presents with   Follow-up   Subjective    HPI  Patient presents for follow-up on mood. Patient reports feeling less depressed, spending more time with family this summer. Anxiety has been about the same. Reports medication compliance with mirtazapine 45 mg. Sleep has been a little better. Reports alcohol cravings have been manageable with Gabapentin 200 mg twice daily. States went to the [facility] several weeks ago and had a couple of beers, drinks occasionally usually 2 beers at most. States has not experienced palpitations, needs refill for Corlanor. Reports is ready to quit smoking and wants to try medication therapy.      02/10/2022   10:30 AM 11/10/2021   10:38 AM 09/11/2021    2:19 PM 07/17/2021    4:16 PM 03/04/2021    1:51 PM  Depression screen PHQ 2/9  Decreased Interest '1 2 3 3 3  '$ Down, Depressed, Hopeless '1 1 3 1 3  '$ PHQ - 2 Score '2 3 6 4 6  '$ Altered sleeping '2 3 3 3 3  '$ Tired, decreased energy '2 3 3 3 3  '$ Change in appetite '1 3 1 1 2  '$ Feeling bad or failure about yourself  '1 1 1 1 3  '$ Trouble concentrating '2 1 1 3 3  '$ Moving slowly or fidgety/restless 0 1 0 0 1  Suicidal thoughts 0 0 0 1 1  PHQ-9 Score '10 15 15 16 22  '$ Difficult doing work/chores Very difficult Very difficult Very difficult Extremely dIfficult Very difficult      02/10/2022   10:30 AM 11/10/2021   10:38 AM 09/11/2021    2:19 PM 07/17/2021    4:16 PM  GAD 7 : Generalized Anxiety Score  Nervous, Anxious, on Edge '2 2 3 3  '$ Control/stop worrying '3 1 3 3  '$ Worry too much - different things '2 2 3 3  '$ Trouble relaxing '2 1 3 3  '$ Restless 1 0 1 1  Easily annoyed or irritable 0 0 1 1  Afraid - awful might happen '1 1 1 3  '$ Total GAD 7 Score '11 7 15 17  '$ Anxiety Difficulty Somewhat difficult Very difficult Very difficult Very difficult        Medications: Outpatient  Medications Prior to Visit  Medication Sig   acetaminophen (TYLENOL) 500 MG tablet Take 500 mg by mouth every 6 (six) hours as needed for mild pain or headache.   metoprolol succinate (TOPROL-XL) 25 MG 24 hr tablet TAKE 1/2 TABLET BY MOUTH EVERY DAY   mirtazapine (REMERON) 45 MG tablet TAKE 1 TABLET BY MOUTH AT BEDTIME.   Multiple Vitamins-Minerals (THERA-M) TABS Take 1 tablet by mouth daily.   [DISCONTINUED] gabapentin (NEURONTIN) 100 MG capsule Take 1 capsule (100 mg total) by mouth 3 (three) times daily as needed.   [DISCONTINUED] ivabradine (CORLANOR) 5 MG TABS tablet TAKE 1 TABLET BY MOUTH TWICE A DAY WITH MEALS   [DISCONTINUED] gabapentin (NEURONTIN) 100 MG capsule Take 2 capsules (200 mg total) by mouth 2 (two) times daily.   No facility-administered medications prior to visit.    Review of Systems Review of Systems:  A fourteen system review of systems was performed and found to be positive as per HPI.  Last CBC Lab Results  Component Value Date   WBC 5.1 06/06/2021   HGB 16.2 (H) 06/06/2021   HCT 48.0 (  H) 06/06/2021   MCV 97.6 06/06/2021   MCH 32.9 06/06/2021   RDW 16.6 (H) 06/06/2021   PLT 294 73/71/0626   Last metabolic panel Lab Results  Component Value Date   GLUCOSE 82 06/06/2021   NA 140 06/06/2021   K 3.6 06/06/2021   CL 101 06/06/2021   CO2 22 06/06/2021   BUN 8 06/06/2021   CREATININE 0.30 (L) 06/06/2021   GFRNONAA >60 06/06/2021   CALCIUM 8.7 (L) 06/06/2021   PHOS 3.8 04/18/2020   PROT 9.1 (H) 06/06/2021   ALBUMIN 5.0 06/06/2021   LABGLOB 3.2 07/24/2020   AGRATIO 1.5 07/24/2020   BILITOT 1.1 06/06/2021   ALKPHOS 85 06/06/2021   AST 42 (H) 06/06/2021   ALT 23 06/06/2021   ANIONGAP 17 (H) 06/06/2021   Last lipids Lab Results  Component Value Date   CHOL 226 (H) 04/08/2020   HDL 36 (L) 04/08/2020   LDLCALC 174 (H) 04/08/2020   TRIG 91 04/08/2020   CHOLHDL 6.3 (H) 04/08/2020   Last hemoglobin A1c Lab Results  Component Value Date   HGBA1C  5.8 (H) 04/08/2020   Last thyroid functions Lab Results  Component Value Date   TSH 3.620 07/24/2020   T3TOTAL 185 (H) 07/24/2020   T4TOTAL 7.9 04/08/2020   Last vitamin D Lab Results  Component Value Date   VD25OH 19.0 (L) 04/08/2020     Objective    BP 114/80   Pulse 91   Temp 97.7 F (36.5 C)   Ht 5' 5.5" (1.664 m)   Wt 146 lb (66.2 kg)   LMP 05/06/2020 Comment: spotting   SpO2 95%   BMI 23.93 kg/m  BP Readings from Last 3 Encounters:  02/10/22 114/80  11/10/21 124/82  09/11/21 120/87   Wt Readings from Last 3 Encounters:  02/10/22 146 lb (66.2 kg)  11/10/21 136 lb (61.7 kg)  09/11/21 127 lb (57.6 kg)    Physical Exam  General:  Cooperative, in no acute distress, appropriate for stated age.  Neuro:  Alert and oriented,  extra-ocular muscles intact  HEENT:  Normocephalic, atraumatic, neck supple  Skin:  no gross rash, warm, pink. Cardiac:  RRR, S1 S2 Respiratory: CTA B/L  Vascular:  Ext warm, no cyanosis apprec.; cap RF less 2 sec. Psych:  No HI/SI, judgement and insight good, Euthymic mood. Full Affect.   No results found for any visits on 02/10/22.  Assessment & Plan      Problem List Items Addressed This Visit       Cardiovascular and Mediastinum   NSVT (nonsustained ventricular tachycardia) (HCC)    -Stable. Asymptomatic with medication therapy. Recommend to continue beta blocker therapy and ivabradine 5 mg BID.      Relevant Medications   ivabradine (CORLANOR) 5 MG TABS tablet     Other   Tobacco use    -The patient was counseled on the dangers of tobacco use, and was advised to quit.  Reviewed strategies to maximize success, including stress management, support of family/friends and pharmacotherapy (Varenicline and NRT). Patient wants to start Varenicline.        Relevant Medications   varenicline (CHANTIX PAK) 0.5 MG X 11 & 1 MG X 42 tablet   GAD (generalized anxiety disorder)    -Patient's baseline. Will continue current medication  regimen. Will continue to monitor.      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 Will continue current medication regimen. Will continue to monitor.  Insomnia due to other mental disorder    -Some improvement. Recommend to continue compliance with Mirtazapine 45 mg at bedtime.       Other Visit Diagnoses     Alcohol abuse       Relevant Medications   gabapentin (NEURONTIN) 100 MG capsule      Alcohol abuse: -Stable. Encourage to avoid alcohol use. Continue Gabapentin 200 mg BID. Will continue to monitor.  Return in about 4 months (around 06/12/2022) for CPE and FBW.        Lorrene Reid, PA-C  [facility] at [facility] [phone] (phone) [phone] (fax)  [place]

## 2022-02-10 NOTE — Assessment & Plan Note (Signed)
-  Patient's baseline. Will continue current medication regimen. Will continue to monitor.

## 2022-02-10 NOTE — Assessment & Plan Note (Signed)
-  The patient was counseled on the dangers of tobacco use, and was advised to quit.  Reviewed strategies to maximize success, including stress management, support of family/friends and pharmacotherapy (Varenicline and NRT). Patient wants to start Varenicline.

## 2022-02-10 NOTE — Patient Instructions (Signed)

## 2022-02-10 NOTE — Assessment & Plan Note (Signed)
-  Some improvement. Recommend to continue compliance with Mirtazapine 45 mg at bedtime.

## 2022-02-10 NOTE — Assessment & Plan Note (Signed)
-  Improved. Will continue current medication regimen. Will continue to monitor.

## 2022-02-10 NOTE — Assessment & Plan Note (Signed)
-  Stable. Asymptomatic with medication therapy. Recommend to continue beta blocker therapy and ivabradine 5 mg BID.

## 2022-03-09 ENCOUNTER — Other Ambulatory Visit: Payer: Self-pay | Admitting: Physician Assistant

## 2022-03-09 DIAGNOSIS — F322 Major depressive disorder, single episode, severe without psychotic features: Secondary | ICD-10-CM

## 2022-03-09 DIAGNOSIS — F411 Generalized anxiety disorder: Secondary | ICD-10-CM

## 2022-06-22 NOTE — Progress Notes (Signed)
Complete physical exam   Patient: Rachel Bennett   DOB: [birth year]   42 y.o. Female  MRN: [id number] Visit Date: 06/23/2022    Chief Complaint  Patient presents with   Annual Exam   Subjective    Rachel Bennett is a 42 y.o. female who presents today for a complete physical exam.  She reports consuming a  generally healthy  diet. The patient does not participate in regular exercise at present. She generally feels fairly well. She does have additional problems to discuss today.   HPI  Annual physical  -due to  have routine, fasting labs checked today.  -started on Chantix at most recent visit here.  -now vaping rather than smoking. Was never on Chantix.  -MDD --on mirtazapine and doing well.  On corlanor for blood pressure control.  -has been out for about 3 weeks.  -feels ok. Has not concerns or complaints.    Past Medical History:  Diagnosis Date   Abnormal EKG 05/20/2020   Anemia    Anxiety    Atypical nevi 05/19/2017   Chronic bilateral low back pain 03/05/2017   Chronic pain    back, hips and pelvis   Chronic radicular lumbar pain 05/19/2017   Cigarette smoker 05/20/2020   Depression    Dysmenorrhea    Dysphagia, idiopathic 03/05/2017   Elevated lipids    Encounter for insertion of mirena IUD 05/19/2017   Endometriosis    Healthcare maintenance 05/19/2017   High serum low density lipoprotein (LDL) cholesterol 05/19/2017   Iron deficiency anemia 05/18/2017   Mixed dyslipidemia 05/20/2020   NSVT (nonsustained ventricular tachycardia) ([place]) 12/10/2020   Opiate withdrawal ([place]) 03/04/2017   Ovarian cyst    Palpitations 07/01/2020   Pre-diabetes    Prediabetes    Preoperative cardiovascular examination 05/20/2020   S/P laparoscopic hysterectomy 05/28/2020   Screening examination for STD (sexually transmitted disease) 05/19/2017   Tobacco use 05/19/2017   Vitamin D deficiency 05/19/2017   Past Surgical History:  Procedure Laterality Date   CYSTOSCOPY N/A  05/28/2020   Procedure: CYSTOSCOPY;  Surgeon: Salvadore Dom, MD;  Location: [facility];  Service: Gynecology;  Laterality: N/A;   LEEP     LUMBAR DISC SURGERY     NASAL SEPTUM SURGERY     TOTAL LAPAROSCOPIC HYSTERECTOMY WITH SALPINGECTOMY N/A 05/28/2020   Procedure: TOTAL LAPAROSCOPIC HYSTERECTOMY WITH SALPINGECTOMY;  Surgeon: Salvadore Dom, MD;  Location: [facility];  Service: Gynecology;  Laterality: N/A;   Social History   Socioeconomic History   Marital status: Divorced    Spouse name: Not on file   Number of children: Not on file   Years of education: Not on file   Highest education level: Not on file  Occupational History   Not on file  Tobacco Use   Smoking status: Former    Packs/day: 1.50    Years: 23.00    Total pack years: 34.50    Types: Cigarettes    Quit date: 59    Years since quitting: 26.0   Smokeless tobacco: Never  Vaping Use   Vaping Use: Never used  Substance and Sexual Activity   Alcohol use: Yes    Comment: daily - vodka, white claw   Drug use: No   Sexual activity: Yes    Birth control/protection: None  Other Topics Concern   Not on file  Social History Narrative   Not on file   Social Determinants of Radio broadcast assistant  Strain: Not on file  Food Insecurity: Not on file  Transportation Needs: Not on file  Physical Activity: Not on file  Stress: Not on file  Social Connections: Not on file  Intimate Partner Violence: Not on file   Family Status  Relation Name Status   Mother  Alive   Father  Alive   Sister  Alive   Brother  Alive   Daughter  Alive   Sister  Alive   Brother  Alive   Brother  Alive   Daughter  Alive   Family History  Problem Relation Age of Onset   Arthritis Mother    Anxiety disorder Mother    Depression Mother    Diabetes Mother    Heart disease Mother    Hyperlipidemia Mother    Hypertension Mother    Thyroid disease Mother    Birth defects Sister     Migraines Sister    Healthy Brother    Healthy Daughter    Healthy Sister    Drug abuse Brother    Healthy Brother    Healthy Daughter    Allergies  Allergen Reactions   Sulfa Antibiotics Itching   Ibuprofen Itching    Patient Care Team: Lorrene Reid, PA-C as PCP - General (Physician Assistant) Revankar, Reita Cliche, MD as PCP - Cardiology (Cardiology) Salvadore Dom, MD as Consulting Physician (Obstetrics and Gynecology)   Medications: Outpatient Medications Prior to Visit  Medication Sig   acetaminophen (TYLENOL) 500 MG tablet Take 500 mg by mouth every 6 (six) hours as needed for mild pain or headache.   [DISCONTINUED] ivabradine (CORLANOR) 5 MG TABS tablet Take 1 tablet (5 mg total) by mouth 2 (two) times daily with a meal.   [DISCONTINUED] mirtazapine (REMERON) 45 MG tablet TAKE 1 TABLET BY MOUTH EVERYDAY AT BEDTIME   [DISCONTINUED] gabapentin (NEURONTIN) 100 MG capsule Take 2 capsules (200 mg total) by mouth 2 (two) times daily.   [DISCONTINUED] metoprolol succinate (TOPROL-XL) 25 MG 24 hr tablet TAKE 1/2 TABLET BY MOUTH EVERY DAY   [DISCONTINUED] Multiple Vitamins-Minerals (THERA-M) TABS Take 1 tablet by mouth daily.   [DISCONTINUED] varenicline (CHANTIX PAK) 0.5 MG X 11 & 1 MG X 42 tablet Take one 0.5 mg tablet by mouth once daily for 3 days, then increase to one 0.5 mg tablet twice daily for 4 days, then increase to one 1 mg tablet twice daily.   No facility-administered medications prior to visit.    Review of Systems  Constitutional:  Positive for fatigue. Negative for activity change, appetite change, chills and fever.  HENT:  Positive for ear pain. Negative for congestion, postnasal drip, rhinorrhea, sinus pressure, sinus pain, sneezing and sore throat.        Ears itching and flaky, especially in outer canals.   Eyes: Negative.   Respiratory:  Negative for cough, chest tightness, shortness of breath and wheezing.   Cardiovascular:  Negative for chest pain and  palpitations.  Gastrointestinal:  Negative for abdominal pain, constipation, diarrhea, nausea and vomiting.  Endocrine: Negative for cold intolerance, heat intolerance, polydipsia and polyuria.  Genitourinary:  Negative for dyspareunia, dysuria, flank pain, frequency and urgency.  Musculoskeletal:  Negative for arthralgias, back pain and myalgias.  Skin:  Negative for rash.  Allergic/Immunologic: Negative for environmental allergies.  Neurological:  Negative for dizziness, weakness and headaches.  Hematological:  Negative for adenopathy.  Psychiatric/Behavioral:  Positive for dysphoric mood. The patient is nervous/anxious.        Well controlled on current  medications        Objective     Today's Vitals   06/23/22 0933 06/23/22 1027  BP: (Abnormal) 131/90 123/80  Pulse: 76   SpO2: 97%   Weight: 156 lb 1.9 oz (70.8 kg)   Height: 5' 5.5" (1.664 m)    Body mass index is 25.58 kg/m.  BP Readings from Last 3 Encounters:  06/23/22 123/80  02/10/22 114/80  11/10/21 124/82    Wt Readings from Last 3 Encounters:  06/23/22 156 lb 1.9 oz (70.8 kg)  02/10/22 146 lb (66.2 kg)  11/10/21 136 lb (61.7 kg)     Physical Exam Vitals and nursing note reviewed.  Constitutional:      Appearance: Normal appearance. She is well-developed.  HENT:     Head: Normocephalic and atraumatic.     Right Ear: Ear canal and external ear normal. Tympanic membrane is erythematous and bulging.     Left Ear: Ear canal and external ear normal. Tympanic membrane is erythematous and bulging.     Ears:     Comments: Flaking of skin inside outer ear canals, bilaterally     Nose: Nose normal.     Mouth/Throat:     Mouth: Mucous membranes are moist.     Pharynx: Oropharynx is clear.  Eyes:     Extraocular Movements: Extraocular movements intact.     Conjunctiva/sclera: Conjunctivae normal.     Pupils: Pupils are equal, round, and reactive to light.  Cardiovascular:     Rate and Rhythm: Normal rate and  regular rhythm.     Pulses: Normal pulses.     Heart sounds: Normal heart sounds.  Pulmonary:     Effort: Pulmonary effort is normal.     Breath sounds: Normal breath sounds.  Abdominal:     General: Bowel sounds are normal. There is no distension.     Palpations: Abdomen is soft. There is no mass.     Tenderness: There is no abdominal tenderness. There is no right CVA tenderness, left CVA tenderness, guarding or rebound.     Hernia: No hernia is present.  Musculoskeletal:        General: Normal range of motion.     Cervical back: Normal range of motion and neck supple.  Lymphadenopathy:     Cervical: No cervical adenopathy.  Skin:    General: Skin is warm and dry.     Capillary Refill: Capillary refill takes less than 2 seconds.  Neurological:     General: No focal deficit present.     Mental Status: She is alert and oriented to person, place, and time.  Psychiatric:        Mood and Affect: Mood normal.        Behavior: Behavior normal.        Thought Content: Thought content normal.        Judgment: Judgment normal.     Last depression screening scores   Row Labels 06/23/2022    9:35 AM 02/10/2022   10:30 AM 11/10/2021   10:38 AM  PHQ 2/9 Scores   Section Header. No data exists in this row.     PHQ - 2 Score   '2 2 3  '$ PHQ- 9 Score   '9 10 15   '$ Last fall risk screening   Row Labels 06/23/2022    9:36 AM  [place]. No data exists in this row.   Falls in the past year?   1  Number  falls in past yr:   0  Injury with Fall?   1  Follow up   Falls evaluation completed    Results for orders placed or performed in visit on 06/23/22  TSH + free T4  Result Value Ref Range   TSH 2.160 0.450 - 4.500 uIU/mL   Free T4 0.95 0.82 - 1.77 ng/dL  VITAMIN D 25 Hydroxy (Vit-D Deficiency, Fractures)  Result Value Ref Range   Vit D, 25-Hydroxy 13.8 (L) 30.0 - 100.0 ng/mL  Hemoglobin A1c  Result Value Ref Range   Hgb A1c MFr Bld 5.8 (H) 4.8 - 5.6 %   Est. average  glucose Bld gHb Est-mCnc 120 mg/dL  Lipid panel  Result Value Ref Range   Cholesterol, Total 225 (H) 100 - 199 mg/dL   Triglycerides 290 (H) 0 - 149 mg/dL   HDL 54 >39 mg/dL   VLDL Cholesterol Cal 51 (H) 5 - 40 mg/dL   LDL Chol Calc (NIH) 120 (H) 0 - 99 mg/dL   Chol/HDL Ratio 4.2 0.0 - 4.4 ratio  Comprehensive metabolic panel  Result Value Ref Range   Glucose 96 70 - 99 mg/dL   BUN 17 6 - 24 mg/dL   Creatinine, Ser 0.68 0.57 - 1.00 mg/dL   eGFR 111 >59 mL/min/1.73   BUN/Creatinine Ratio 25 (H) 9 - 23   Sodium 143 134 - 144 mmol/L   Potassium 4.4 3.5 - 5.2 mmol/L   Chloride 104 96 - 106 mmol/L   CO2 27 20 - 29 mmol/L   Calcium 9.0 8.7 - 10.2 mg/dL   Total Protein 6.7 6.0 - 8.5 g/dL   Albumin 4.5 3.9 - 4.9 g/dL   Globulin, Total 2.2 1.5 - 4.5 g/dL   Albumin/Globulin Ratio 2.0 1.2 - 2.2   Bilirubin Total 0.4 0.0 - 1.2 mg/dL   Alkaline Phosphatase 79 44 - 121 IU/L   AST 17 0 - 40 IU/L   ALT 14 0 - 32 IU/L  CBC  Result Value Ref Range   WBC 4.9 3.4 - 10.8 x10E3/uL   RBC 4.23 3.77 - 5.28 x10E6/uL   Hemoglobin 13.5 11.1 - 15.9 g/dL   Hematocrit 39.3 34.0 - 46.6 %   MCV 93 79 - 97 fL   MCH 31.9 26.6 - 33.0 pg   MCHC 34.4 31.5 - 35.7 g/dL   RDW 12.3 11.7 - 15.4 %   Platelets 304 150 - 450 x10E3/uL    Assessment & Plan    1. Encounter for general adult medical examination with abnormal findings Annual  physical today   2. Acute eczematoid otitis externa of right ear May use acetic acid drops in both ears twice daily for 7 to 10 days when needed.  - acetic acid 2 % otic solution; Place 4 drops into both ears 3 (three) times daily.  Dispense: 15 mL; Refill: 0  3. Other fatigue Check thyroid and vitamin d for further  evaluation.   - TSH + free T4 - VITAMIN D 25 Hydroxy (Vit-D Deficiency, Fractures)  4. Alcohol abuse Stable. Doing well. Continue gabapentin 200 mg twice daily.  - gabapentin (NEURONTIN) 100 MG capsule; Take 2 capsules (200 mg total) by mouth 2 (two) times  daily.  Dispense: 120 capsule; Refill: 3  5. Vitamin D deficiency Check vitamin d level and treat deficiency as indicated.   - VITAMIN D 25 Hydroxy (Vit-D Deficiency, Fractures)  6. Impaired fasting glucose Check HgbA1c for further evaluation.  - Hemoglobin A1c  7. Need  for influenza vaccination Flu vaccine administered during today's visit.  - Flu Vaccine QUAD 6+ mos PF IM (Fluarix Quad PF)  8. Encounter for screening mammogram for malignant neoplasm of breast Screening mammogram ordered today  - MM DIGITAL SCREENING BILATERAL; Future  9. Healthcare maintenance Routine, fasting labs ordered during today's visit.  - Lipid panel - Comprehensive metabolic panel - CBC    Immunization History  Administered Date(s) Administered   HPV 9-valent 01/15/2020, 03/19/2020, 07/25/2020   Influenza,inj,Quad PF,6+ Mos 05/19/2017, 04/16/2020, 06/08/2021, 06/23/2022   Tdap 05/19/2017    Health Maintenance  Topic Date Due   COVID-19 Vaccine (1) Never done   PAP SMEAR-Modifier  01/16/2023   DTaP/Tdap/Td (2 - Td or Tdap) 05/20/2027   INFLUENZA VACCINE  Completed   HPV VACCINES  Completed   Hepatitis C Screening  Completed   HIV Screening  Completed    Discussed health benefits of physical activity, and encouraged her to engage in regular exercise appropriate for her age and condition.  Problem List Items Addressed This Visit       Endocrine   Impaired fasting glucose   Relevant Orders   Hemoglobin A1c (Completed)     Nervous and Auditory   Acute eczematoid otitis externa of right ear   Relevant Medications   acetic acid 2 % otic solution     Other   Healthcare maintenance   Relevant Orders   Lipid panel (Completed)   Comprehensive metabolic panel (Completed)   CBC (Completed)   Vitamin D deficiency   Relevant Orders   VITAMIN D 25 Hydroxy (Vit-D Deficiency, Fractures) (Completed)   Other fatigue   Relevant Orders   TSH + free T4 (Completed)   VITAMIN D 25 Hydroxy  (Vit-D Deficiency, Fractures) (Completed)   Alcohol abuse   Relevant Medications   gabapentin (NEURONTIN) 100 MG capsule   Other Visit Diagnoses     Encounter for general adult medical examination with abnormal findings    -  Primary   Need for influenza vaccination       Relevant Orders   Flu Vaccine QUAD 6+ mos PF IM (Fluarix Quad PF) (Completed)   Encounter for screening mammogram for malignant neoplasm of breast       Relevant Orders   MM DIGITAL SCREENING BILATERAL        Return in about 1 year (around 06/24/2023) for health maintenance exam, FBW a week prior to visit.        Ronnell Freshwater, NP  [facility] [phone] (phone) [phone] (fax)  [place]

## 2022-06-23 ENCOUNTER — Ambulatory Visit (INDEPENDENT_AMBULATORY_CARE_PROVIDER_SITE_OTHER): Payer: Commercial Managed Care - PPO | Admitting: Nurse Practitioner

## 2022-06-23 ENCOUNTER — Other Ambulatory Visit: Payer: Self-pay

## 2022-06-23 ENCOUNTER — Encounter: Payer: Self-pay | Admitting: Nurse Practitioner

## 2022-06-23 VITALS — BP 123/80 | HR 76 | Ht 65.5 in | Wt 156.1 lb

## 2022-06-23 DIAGNOSIS — R7301 Impaired fasting glucose: Secondary | ICD-10-CM

## 2022-06-23 DIAGNOSIS — H60541 Acute eczematoid otitis externa, right ear: Secondary | ICD-10-CM

## 2022-06-23 DIAGNOSIS — Z0000 Encounter for general adult medical examination without abnormal findings: Secondary | ICD-10-CM

## 2022-06-23 DIAGNOSIS — Z0001 Encounter for general adult medical examination with abnormal findings: Secondary | ICD-10-CM

## 2022-06-23 DIAGNOSIS — F411 Generalized anxiety disorder: Secondary | ICD-10-CM

## 2022-06-23 DIAGNOSIS — R5383 Other fatigue: Secondary | ICD-10-CM | POA: Diagnosis not present

## 2022-06-23 DIAGNOSIS — F1010 Alcohol abuse, uncomplicated: Secondary | ICD-10-CM

## 2022-06-23 DIAGNOSIS — Z23 Encounter for immunization: Secondary | ICD-10-CM | POA: Diagnosis not present

## 2022-06-23 DIAGNOSIS — I4729 Other ventricular tachycardia: Secondary | ICD-10-CM

## 2022-06-23 DIAGNOSIS — E559 Vitamin D deficiency, unspecified: Secondary | ICD-10-CM

## 2022-06-23 DIAGNOSIS — F322 Major depressive disorder, single episode, severe without psychotic features: Secondary | ICD-10-CM

## 2022-06-23 DIAGNOSIS — Z1231 Encounter for screening mammogram for malignant neoplasm of breast: Secondary | ICD-10-CM

## 2022-06-23 MED ORDER — ACETIC ACID 2 % OT SOLN: 4.0000 [drp] | Freq: Three times a day (TID) | OTIC | 0 refills | Status: AC

## 2022-06-23 MED ORDER — GABAPENTIN 100 MG PO CAPS: 200.0000 mg | ORAL_CAPSULE | Freq: Two times a day (BID) | ORAL | 3 refills | Status: DC

## 2022-06-23 MED ORDER — IVABRADINE HCL 5 MG PO TABS: 5.0000 mg | ORAL_TABLET | Freq: Two times a day (BID) | ORAL | 2 refills | Status: AC

## 2022-06-23 MED ORDER — MIRTAZAPINE 45 MG PO TABS: ORAL_TABLET | ORAL | 2 refills | Status: DC

## 2022-06-24 LAB — LIPID PANEL
Chol/HDL Ratio: 4.2 ratio (ref 0.0–4.4)
Cholesterol, Total: 225 mg/dL — ABNORMAL HIGH (ref 100–199)
HDL: 54 mg/dL (ref >39)
LDL Chol Calc (NIH): 120 mg/dL — ABNORMAL HIGH (ref 0–99)
Triglycerides: 290 mg/dL — ABNORMAL HIGH (ref 0–149)
VLDL Cholesterol Cal: 51 mg/dL — ABNORMAL HIGH (ref 5–40)

## 2022-06-24 LAB — CBC
Hematocrit: 39.3 % (ref 34.0–46.6)
Hemoglobin: 13.5 g/dL (ref 11.1–15.9)
MCH: 31.9 pg (ref 26.6–33.0)
MCHC: 34.4 g/dL (ref 31.5–35.7)
MCV: 93 fL (ref 79–97)
Platelets: 304 10*3/uL (ref 150–450)
RBC: 4.23 x10E6/uL (ref 3.77–5.28)
RDW: 12.3 % (ref 11.7–15.4)
WBC: 4.9 10*3/uL (ref 3.4–10.8)

## 2022-06-24 LAB — COMPREHENSIVE METABOLIC PANEL
ALT: 14 IU/L (ref 0–32)
AST: 17 IU/L (ref 0–40)
Albumin/Globulin Ratio: 2 (ref 1.2–2.2)
Albumin: 4.5 g/dL (ref 3.9–4.9)
Alkaline Phosphatase: 79 IU/L (ref 44–121)
BUN/Creatinine Ratio: 25 — ABNORMAL HIGH (ref 9–23)
BUN: 17 mg/dL (ref 6–24)
Bilirubin Total: 0.4 mg/dL (ref 0.0–1.2)
CO2: 27 mmol/L (ref 20–29)
Calcium: 9 mg/dL (ref 8.7–10.2)
Chloride: 104 mmol/L (ref 96–106)
Creatinine, Ser: 0.68 mg/dL (ref 0.57–1.00)
Globulin, Total: 2.2 g/dL (ref 1.5–4.5)
Glucose: 96 mg/dL (ref 70–99)
Potassium: 4.4 mmol/L (ref 3.5–5.2)
Sodium: 143 mmol/L (ref 134–144)
Total Protein: 6.7 g/dL (ref 6.0–8.5)
eGFR: 111 mL/min/{1.73_m2} (ref >59)

## 2022-06-24 LAB — HEMOGLOBIN A1C
Est. average glucose Bld gHb Est-mCnc: 120 mg/dL
Hgb A1c MFr Bld: 5.8 % — ABNORMAL HIGH (ref 4.8–5.6)

## 2022-06-24 LAB — TSH+FREE T4
Free T4: 0.95 ng/dL (ref 0.82–1.77)
TSH: 2.16 u[IU]/mL (ref 0.450–4.500)

## 2022-06-24 LAB — VITAMIN D 25 HYDROXY (VIT D DEFICIENCY, FRACTURES): Vit D, 25-Hydroxy: 13.8 ng/mL — ABNORMAL LOW (ref 30.0–100.0)

## 2022-07-14 ENCOUNTER — Ambulatory Visit
Admission: EM | Admit: 2022-07-14 | Discharge: 2022-07-14 | Disposition: A | Discharge status: Home / Self Care | Payer: Commercial Managed Care - PPO | Attending: Internal Medicine | Admitting: Internal Medicine

## 2022-07-14 DIAGNOSIS — Z113 Encounter for screening for infections with a predominantly sexual mode of transmission: Secondary | ICD-10-CM | POA: Diagnosis present

## 2022-07-14 DIAGNOSIS — N898 Other specified noninflammatory disorders of vagina: Secondary | ICD-10-CM | POA: Insufficient documentation

## 2022-07-14 MED ORDER — METRONIDAZOLE 500 MG PO TABS: 500.0000 mg | ORAL_TABLET | Freq: Two times a day (BID) | ORAL | 0 refills | Status: AC

## 2022-07-14 NOTE — ED Triage Notes (Signed)
Pt presents to [facility] with co of lower abd pain , vginal itchiness and oder since Thursday denies any dysuria. Pt has been using tylenol.

## 2022-07-14 NOTE — ED Provider Notes (Signed)
[facility]    CSN: [id number] Arrival date & time: 07/14/22  1625      History   Chief Complaint Chief Complaint  Patient presents with   Vaginal Discharge    HPI Rachel Bennett is a 42 y.o. female.   Patient presents with lower abdominal discomfort, vaginal itchiness, vaginal odor, vaginal discharge that started 6 days ago.  Patient reports vaginal discharge is white in color.  She denies any exposure to STD but has had unprotected intercourse.  Last menstrual cycle is unknown given that she has had a complete hysterectomy.  Patient denies any dysuria, urinary frequency, abnormal vaginal bleeding, hematuria, fever.  Patient reports that she does have back pain at baseline which is unchanged since the symptoms started.   Vaginal Discharge   Past Medical History:  Diagnosis Date   Abnormal EKG 05/20/2020   Anemia    Anxiety    Atypical nevi 05/19/2017   Chronic bilateral low back pain 03/05/2017   Chronic pain    back, hips and pelvis   Chronic radicular lumbar pain 05/19/2017   Cigarette smoker 05/20/2020   Depression    Dysmenorrhea    Dysphagia, idiopathic 03/05/2017   Elevated lipids    Encounter for insertion of mirena IUD 05/19/2017   Endometriosis    Healthcare maintenance 05/19/2017   High serum low density lipoprotein (LDL) cholesterol 05/19/2017   Iron deficiency anemia 05/18/2017   Mixed dyslipidemia 05/20/2020   NSVT (nonsustained ventricular tachycardia) ([place]) 12/10/2020   Opiate withdrawal ([place]) 03/04/2017   Ovarian cyst    Palpitations 07/01/2020   Pre-diabetes    Prediabetes    Preoperative cardiovascular examination 05/20/2020   S/P laparoscopic hysterectomy 05/28/2020   Screening examination for STD (sexually transmitted disease) 05/19/2017   Tobacco use 05/19/2017   Vitamin D deficiency 05/19/2017    Patient Active Problem List   Diagnosis Date Noted   GAD (generalized anxiety disorder) 09/11/2021   Moderately severe major  depression ([place]) 09/11/2021   Insomnia due to other mental disorder 09/11/2021   Alcohol withdrawal syndrome without complication ([place]) 03/47/4259   NSVT (nonsustained ventricular tachycardia) ([place]) 12/10/2020   Palpitations 07/01/2020   Pre-diabetes    S/P laparoscopic hysterectomy 05/28/2020   Preoperative cardiovascular examination 05/20/2020   Mixed dyslipidemia 05/20/2020   Cigarette smoker 05/20/2020   Abnormal EKG 05/20/2020   Ovarian cyst    Endometriosis    Dysmenorrhea    Depression    Chronic pain    Anxiety    Anemia    Prediabetes    Elevated lipids    Chronic radicular lumbar pain 05/19/2017   Encounter for insertion of mirena IUD 05/19/2017   Atypical nevi 05/19/2017   Screening examination for STD (sexually transmitted disease) 05/19/2017   Tobacco use 05/19/2017   High serum low density lipoprotein (LDL) cholesterol 05/19/2017   Healthcare maintenance 05/19/2017   Vitamin D deficiency 05/19/2017   Iron deficiency anemia 05/18/2017   Chronic bilateral low back pain 03/05/2017   Dysphagia, idiopathic 03/05/2017   Opiate withdrawal ([place]) 03/04/2017    Past Surgical History:  Procedure Laterality Date   CYSTOSCOPY N/A 05/28/2020   Procedure: CYSTOSCOPY;  Surgeon: Salvadore Dom, MD;  Location: [facility];  Service: Gynecology;  Laterality: N/A;   LEEP     LUMBAR DISC SURGERY     NASAL SEPTUM SURGERY     TOTAL LAPAROSCOPIC HYSTERECTOMY WITH SALPINGECTOMY N/A 05/28/2020   Procedure: TOTAL LAPAROSCOPIC HYSTERECTOMY WITH SALPINGECTOMY;  Surgeon: Sumner Boast  Estill Bamberg, MD;  Location: [facility];  Service: Gynecology;  Laterality: N/A;    OB History     Gravida  2   Para  2   Term  2   Preterm      AB      Living  2      SAB      IAB      Ectopic      Multiple      Live Births  2            Home Medications    Prior to Admission medications   Medication Sig Start Date End Date Taking? Authorizing  Provider  metroNIDAZOLE (FLAGYL) 500 MG tablet Take 1 tablet (500 mg total) by mouth 2 (two) times daily for 14 days. 07/14/22 07/28/22 Yes Mohsin Crum, Michele Rockers, FNP  acetaminophen (TYLENOL) 500 MG tablet Take 500 mg by mouth every 6 (six) hours as needed for mild pain or headache.    [provider]  acetic acid 2 % otic solution Place 4 drops into both ears 3 (three) times daily. 06/23/22   Ronnell Freshwater, NP  gabapentin (NEURONTIN) 100 MG capsule Take 2 capsules (200 mg total) by mouth 2 (two) times daily. 06/23/22   Ronnell Freshwater, NP  ivabradine (CORLANOR) 5 MG TABS tablet Take 1 tablet (5 mg total) by mouth 2 (two) times daily with a meal. 06/23/22   Boscia, Greer Ee, NP  mirtazapine (REMERON) 45 MG tablet TAKE 1 TABLET BY MOUTH EVERYDAY AT BEDTIME 06/23/22   Ronnell Freshwater, NP    Family History Family History  Problem Relation Age of Onset   Arthritis Mother    Anxiety disorder Mother    Depression Mother    Diabetes Mother    Heart disease Mother    Hyperlipidemia Mother    Hypertension Mother    Thyroid disease Mother    Birth defects Sister    Migraines Sister    Healthy Brother    Healthy Daughter    Healthy Sister    Drug abuse Brother    Healthy Brother    Healthy Daughter     Social History Social History   Tobacco Use   Smoking status: Former    Packs/day: 1.50    Years: 23.00    Total pack years: 34.50    Types: Cigarettes    Quit date: 1998    Years since quitting: 26.0   Smokeless tobacco: Never  Vaping Use   Vaping Use: Never used  Substance Use Topics   Alcohol use: Yes    Comment: daily - vodka, white claw   Drug use: No     Allergies   Sulfa antibiotics and Ibuprofen   Review of Systems Review of Systems Per HPI  Physical Exam Triage Vital Signs ED Triage Vitals  Enc Vitals Group     BP --      Pulse Rate 07/14/22 1653 65     Resp 07/14/22 1653 19     Temp 07/14/22 1653 97.9 F (36.6 C)     Temp Source 07/14/22 1653 Oral      SpO2 07/14/22 1653 98 %     Weight --      Height --      Head Circumference --      Peak Flow --      Pain Score 07/14/22 1652 2     Pain Loc --      Pain Edu? --  Excl. in GC? --    No data found.  Updated Vital Signs Pulse (!) 2   Temp 97.9 F (36.6 C) (Oral)   Resp 19   LMP 05/06/2020 Comment: spotting   SpO2 98%   Visual Acuity Right Eye Distance:   Left Eye Distance:   Bilateral Distance:    Right Eye Near:   Left Eye Near:    Bilateral Near:     Physical Exam Constitutional:      General: She is not in acute distress.    Appearance: Normal appearance. She is not toxic-appearing or diaphoretic.  HENT:     Head: Normocephalic and atraumatic.  Eyes:     Extraocular Movements: Extraocular movements intact.     Conjunctiva/sclera: Conjunctivae normal.  Cardiovascular:     Rate and Rhythm: Normal rate and regular rhythm.     Pulses: Normal pulses.     Heart sounds: Normal heart sounds.  Pulmonary:     Effort: Pulmonary effort is normal. No respiratory distress.     Breath sounds: Normal breath sounds.  Abdominal:     General: Bowel sounds are normal. There is no distension.     Palpations: Abdomen is soft.     Tenderness: There is no abdominal tenderness.  Genitourinary:    Comments: Deferred with shared decision making.  Self swab performed. Neurological:     General: No focal deficit present.     Mental Status: She is alert and oriented to person, place, and time. Mental status is at baseline.  Psychiatric:        Mood and Affect: Mood normal.        Behavior: Behavior normal.        Thought Content: Thought content normal.        Judgment: Judgment normal.      UC Treatments / Results  Labs (all labs ordered are listed, but only abnormal results are displayed) Labs Reviewed  CERVICOVAGINAL ANCILLARY ONLY    EKG   Radiology No results found.  Procedures Procedures (including critical care time)  Medications Ordered in  UC Medications - No data to display  Initial Impression / Assessment and Plan / UC Course  I have reviewed the triage vital signs and the nursing notes.  Pertinent labs & imaging results that were available during my care of the patient were reviewed by me and considered in my medical decision making (see chart for details).     I am highly suspicious of bacterial vaginosis given that patient has had this multiple times before.  Given patient is having some lower abdominal pain, will treat with metronidazole for 14 days.  Cervicovaginal swab is pending.  Will await results for any further treatment especially given no confirmed exposure to STD.  Urine pregnancy deferred given history of complete hysterectomy.  Urinalysis deferred given no associated urinary symptoms. Patient was advised to follow-up if any symptoms persist or worsen.  Patient verbalized understanding and was agreeable with plan. Final Clinical Impressions(s) / UC Diagnoses   Final diagnoses:  Vaginal discharge  Screening examination for venereal disease  Vaginal itching     Discharge Instructions      I am suspicious that you could have bacterial vaginosis so I am treating this with antibiotic.  Avoid alcohol with taking this medication.  Follow-up if any symptoms persist or worsen.  The vaginal swab is pending.  We will call if it is abnormal.    ED Prescriptions     Medication Sig Dispense  Auth. Provider   metroNIDAZOLE (FLAGYL) 500 MG tablet Take 1 tablet (500 mg total) by mouth 2 (two) times daily for 14 days. 28 tablet [place], Michele Rockers, [place]      PDMP not reviewed this encounter.   Teodora Medici, [place] 07/14/22 [phone]

## 2022-07-14 NOTE — Discharge Instructions (Signed)
I am suspicious that you could have bacterial vaginosis so I am treating this with antibiotic.  Avoid alcohol with taking this medication.  Follow-up if any symptoms persist or worsen.  The vaginal swab is pending.  We will call if it is abnormal.

## 2022-07-15 LAB — CERVICOVAGINAL ANCILLARY ONLY
Bacterial Vaginitis (gardnerella): POSITIVE — AB
Candida Glabrata: NEGATIVE
Candida Vaginitis: NEGATIVE
Chlamydia: NEGATIVE
Comment: NEGATIVE
Comment: NEGATIVE
Comment: NEGATIVE
Comment: NEGATIVE
Comment: NEGATIVE
Comment: NORMAL
Neisseria Gonorrhea: NEGATIVE
Trichomonas: NEGATIVE

## 2022-07-19 DIAGNOSIS — F1010 Alcohol abuse, uncomplicated: Secondary | ICD-10-CM | POA: Insufficient documentation

## 2022-07-19 DIAGNOSIS — H60541 Acute eczematoid otitis externa, right ear: Secondary | ICD-10-CM | POA: Insufficient documentation

## 2022-07-19 DIAGNOSIS — R5383 Other fatigue: Secondary | ICD-10-CM | POA: Insufficient documentation

## 2022-07-19 DIAGNOSIS — R7301 Impaired fasting glucose: Secondary | ICD-10-CM | POA: Insufficient documentation

## 2022-08-12 ENCOUNTER — Ambulatory Visit
Admission: RE | Admit: 2022-08-12 | Discharge: 2022-08-12 | Disposition: A | Discharge status: Home / Self Care | Payer: Commercial Managed Care - PPO | Source: Ambulatory Visit | Attending: Nurse Practitioner | Admitting: Nurse Practitioner

## 2022-08-12 DIAGNOSIS — Z1231 Encounter for screening mammogram for malignant neoplasm of breast: Secondary | ICD-10-CM

## 2022-09-30 ENCOUNTER — Other Ambulatory Visit: Payer: Self-pay | Admitting: Nurse Practitioner

## 2022-09-30 DIAGNOSIS — F411 Generalized anxiety disorder: Secondary | ICD-10-CM

## 2022-09-30 DIAGNOSIS — F322 Major depressive disorder, single episode, severe without psychotic features: Secondary | ICD-10-CM

## 2022-11-12 ENCOUNTER — Other Ambulatory Visit: Payer: Self-pay | Admitting: Nurse Practitioner

## 2022-11-12 DIAGNOSIS — F1010 Alcohol abuse, uncomplicated: Secondary | ICD-10-CM

## 2022-12-21 NOTE — Progress Notes (Signed)
Vitamin D deficiency.  Mild elevation of lipid panel.  Will continue to monitor.

## 2024-03-31 ENCOUNTER — Other Ambulatory Visit: Payer: Self-pay | Admitting: Medical Genetics

## 2024-04-03 ENCOUNTER — Other Ambulatory Visit
Admission: RE | Admit: 2024-04-03 | Discharge: 2024-04-03 | Disposition: A | Discharge status: Home / Self Care | Payer: Self-pay | Source: Ambulatory Visit | Attending: Medical Genetics | Admitting: Medical Genetics

## 2024-04-14 LAB — GENECONNECT MOLECULAR SCREEN: Genetic Analysis Overall Interpretation: NEGATIVE
